# Patient Record
Sex: Male | Born: 1935 | Race: White | Hispanic: No | Smoking: Never smoker
Health system: Southern US, Community
[De-identification: ages and names within clinical notes are randomized; demographics above are authoritative.]

## PROBLEM LIST (undated history)

## (undated) DIAGNOSIS — I499 Cardiac arrhythmia, unspecified: Secondary | ICD-10-CM

## (undated) DIAGNOSIS — N183 Chronic kidney disease, stage 3 unspecified: Secondary | ICD-10-CM

## (undated) DIAGNOSIS — S52509A Unspecified fracture of the lower end of unspecified radius, initial encounter for closed fracture: Secondary | ICD-10-CM

## (undated) DIAGNOSIS — M5416 Radiculopathy, lumbar region: Secondary | ICD-10-CM

## (undated) DIAGNOSIS — E785 Hyperlipidemia, unspecified: Secondary | ICD-10-CM

## (undated) DIAGNOSIS — R040 Epistaxis: Secondary | ICD-10-CM

## (undated) DIAGNOSIS — K219 Gastro-esophageal reflux disease without esophagitis: Secondary | ICD-10-CM

## (undated) DIAGNOSIS — N400 Enlarged prostate without lower urinary tract symptoms: Secondary | ICD-10-CM

## (undated) DIAGNOSIS — H539 Unspecified visual disturbance: Secondary | ICD-10-CM

## (undated) DIAGNOSIS — K4090 Unilateral inguinal hernia, without obstruction or gangrene, not specified as recurrent: Secondary | ICD-10-CM

## (undated) DIAGNOSIS — I2510 Atherosclerotic heart disease of native coronary artery without angina pectoris: Secondary | ICD-10-CM

## (undated) DIAGNOSIS — M8580 Other specified disorders of bone density and structure, unspecified site: Secondary | ICD-10-CM

## (undated) DIAGNOSIS — R42 Dizziness and giddiness: Secondary | ICD-10-CM

## (undated) DIAGNOSIS — H269 Unspecified cataract: Secondary | ICD-10-CM

## (undated) DIAGNOSIS — I10 Essential (primary) hypertension: Secondary | ICD-10-CM

## (undated) DIAGNOSIS — R7301 Impaired fasting glucose: Secondary | ICD-10-CM

## (undated) DIAGNOSIS — I209 Angina pectoris, unspecified: Secondary | ICD-10-CM

## (undated) DIAGNOSIS — F329 Major depressive disorder, single episode, unspecified: Secondary | ICD-10-CM

## (undated) DIAGNOSIS — M109 Gout, unspecified: Secondary | ICD-10-CM

## (undated) DIAGNOSIS — F419 Anxiety disorder, unspecified: Secondary | ICD-10-CM

## (undated) DIAGNOSIS — M1990 Unspecified osteoarthritis, unspecified site: Secondary | ICD-10-CM

## (undated) DIAGNOSIS — C44309 Unspecified malignant neoplasm of skin of other parts of face: Secondary | ICD-10-CM

## (undated) DIAGNOSIS — Z8719 Personal history of other diseases of the digestive system: Secondary | ICD-10-CM

## (undated) HISTORY — DX: Gastro-esophageal reflux disease without esophagitis: K21.9

## (undated) HISTORY — DX: Anxiety disorder, unspecified: F41.9

## (undated) HISTORY — DX: Impaired fasting glucose: R73.01

## (undated) HISTORY — DX: Unspecified osteoarthritis, unspecified site: M19.90

## (undated) HISTORY — PX: EYE SURGERY: SHX253

## (undated) HISTORY — DX: Atherosclerotic heart disease of native coronary artery without angina pectoris: I25.10

## (undated) HISTORY — DX: Chronic kidney disease, stage 3 (moderate): N18.3

## (undated) HISTORY — PX: TONSILLECTOMY: SUR1361

## (undated) HISTORY — DX: Benign prostatic hyperplasia without lower urinary tract symptoms: N40.0

## (undated) HISTORY — DX: Unspecified cataract: H26.9

## (undated) HISTORY — DX: Dizziness and giddiness: R42

## (undated) HISTORY — DX: Other specified disorders of bone density and structure, unspecified site: M85.80

## (undated) HISTORY — DX: Chronic kidney disease, stage 3 unspecified: N18.30

## (undated) HISTORY — DX: Unspecified fracture of the lower end of unspecified radius, initial encounter for closed fracture: S52.509A

## (undated) HISTORY — DX: Gout, unspecified: M10.9

## (undated) HISTORY — DX: Unspecified malignant neoplasm of skin of other parts of face: C44.309

## (undated) HISTORY — DX: Major depressive disorder, single episode, unspecified: F32.9

## (undated) HISTORY — DX: Epistaxis: R04.0

## (undated) HISTORY — DX: Unilateral inguinal hernia, without obstruction or gangrene, not specified as recurrent: K40.90

## (undated) HISTORY — DX: Unspecified visual disturbance: H53.9

## (undated) HISTORY — DX: Radiculopathy, lumbar region: M54.16

---

## 1997-11-30 DIAGNOSIS — R42 Dizziness and giddiness: Secondary | ICD-10-CM

## 1997-11-30 HISTORY — DX: Dizziness and giddiness: R42

## 1998-05-23 ENCOUNTER — Ambulatory Visit (HOSPITAL_COMMUNITY): Admission: RE | Admit: 1998-05-23 | Discharge: 1998-05-23 | Payer: Self-pay | Admitting: Internal Medicine

## 2000-11-30 DIAGNOSIS — M8580 Other specified disorders of bone density and structure, unspecified site: Secondary | ICD-10-CM

## 2000-11-30 HISTORY — DX: Other specified disorders of bone density and structure, unspecified site: M85.80

## 2001-07-12 ENCOUNTER — Encounter: Payer: Self-pay | Admitting: Internal Medicine

## 2001-07-12 ENCOUNTER — Encounter: Admission: RE | Admit: 2001-07-12 | Discharge: 2001-07-12 | Payer: Self-pay | Admitting: Internal Medicine

## 2002-04-16 ENCOUNTER — Encounter: Payer: Self-pay | Admitting: Emergency Medicine

## 2002-04-17 ENCOUNTER — Inpatient Hospital Stay (HOSPITAL_COMMUNITY): Admission: EM | Admit: 2002-04-17 | Discharge: 2002-04-20 | Payer: Self-pay

## 2002-04-17 ENCOUNTER — Encounter: Payer: Self-pay | Admitting: Internal Medicine

## 2005-07-13 ENCOUNTER — Encounter: Admission: RE | Admit: 2005-07-13 | Discharge: 2005-07-13 | Payer: Self-pay | Admitting: Internal Medicine

## 2006-06-22 ENCOUNTER — Encounter: Admission: RE | Admit: 2006-06-22 | Discharge: 2006-06-22 | Payer: Self-pay | Admitting: Internal Medicine

## 2007-12-01 DIAGNOSIS — R040 Epistaxis: Secondary | ICD-10-CM

## 2007-12-01 HISTORY — DX: Epistaxis: R04.0

## 2008-08-19 ENCOUNTER — Encounter: Admission: RE | Admit: 2008-08-19 | Discharge: 2008-08-19 | Payer: Self-pay | Admitting: Internal Medicine

## 2009-07-26 ENCOUNTER — Emergency Department (HOSPITAL_COMMUNITY): Admission: EM | Admit: 2009-07-26 | Discharge: 2009-07-26 | Payer: Self-pay | Admitting: Emergency Medicine

## 2009-12-11 ENCOUNTER — Encounter: Admission: RE | Admit: 2009-12-11 | Discharge: 2009-12-11 | Payer: Self-pay | Admitting: Internal Medicine

## 2011-04-14 NOTE — Consult Note (Signed)
NAME:  Larry Mcfarland, Larry Mcfarland            ACCOUNT NO.:  [id number]   MEDICAL RECORD NO.:  [id number]          PATIENT TYPE:  EMS   LOCATION:  ED                           FACILITY:  [facility]   PHYSICIAN:  Gae Bon, M.D.  DATE OF BIRTH:  Jul 24, 1921   DATE OF CONSULTATION:  07/26/2009  DATE OF DISCHARGE:                                 CONSULTATION   This is an 77 year old white man who was out walking earlier today and  tripped over the curb and fell onto his face and wrist.  He denies loss  of consciousness.  He was taken to Dr. Kristie Cowman at [facility] who evaluated him and ruled out any bony fractures with chest x-ray  and exam, but there was a laceration of the left upper lip at the  vermilion border that Dr. Ronnald Ramp requested oral maxillofacial surgery  care for suturing for this area.  The patient has no known drug  allergies.   PAST MEDICAL HISTORY:  1. Hypertension.  2. Hyperlipidemia.   MEDICATIONS:  Lisinopril, simvastatin.   PHYSICAL EXAMINATION:  HEENT:  [place]/AT.  No fractures palpated facially, a  2.5 cm laceration left upper lip at the vermilion border horizontal and  parallel to the vermilion border.  Tooth #8 was chipped at the medial  incisal edge.  There was a small contusion intraoral aspect at tooth #9  but no laceration.   IMPRESSION:  Upper lip laceration.   PLAN:  To suture.   PROCEDURE:  Lidocaine 2% and 1:100,000 epinephrine was used, a total of  5 mL.  The patient was prepped and draped in the emergency room.  Deep  sutures were placed with 4-0 chromic and then 5-0 nylon interrupted  sutures were placed to approximate the vermilion border.  The patient  tolerated the procedure well.  He was instructed to keep ice on the  area, to keep the sutures dry and to return to my office in 5 days for  suture removal.      Gae Bon, M.D.  Electronically Signed     SMJ/MEDQ  D:  07/26/2009  T:  07/27/2009  Job:  [id number]

## 2011-04-17 NOTE — H&P (Signed)
[place]. [facility]  Patient:    Larry Mcfarland, Larry Mcfarland Visit Number: [id number] MRN: [id number]          Service Type: MED Location: [phone] Attending Physician:  Horton Finer Dictated by:   Nehemiah Settle, M.D. Admit Date:  04/16/2002                           History and Physical  HISTORY OF PRESENT ILLNESS:  Mr. Larry Mcfarland is a very nice 77 year old man admitted with atrial flutter.  He was fine until approximately Apr 12, 2002 when he felt poorly.  On that evening he slept poorly and was tired and just did not feel right.  He was mildly short of breath.  On May 16 he tried to walk late in the evening but could not walk more than 10 minutes due to fatigue and shortness of breath.  On May 17 he felt about the same except for some high chest pressure.  On May 18 he felt a little better at breakfast, but through the day he developed some chest tightness.  Blood pressure was 130/80 at the drugstore and his pulse was around 90 at that time.  This evening he had a temperature of 100.2 to 100.4.  He has had some continued chest pressure and he has just felt sick.  He called me and I directed him to the emergency department.  The emergency department physician found atrial fibrillation with rapid ventricular response.  He was given Cardizem IV and went into atrial flutter.  He had a mild cough for the last four days.  PAST MEDICAL HISTORY:  MEDICAL:  BPH with urinary frequency.  Recent eye exam treated with minocycline.  SURGICAL:  None except tonsillectomy.  ALLERGIES:  PENICILLIN.  MEDICATIONS: 1. Aspirin q.d. and p.r.n. 2. Flomax 0.4 mg daily. 3. Minocycline 50 mg q.d. x15 days for eye infection.  SOCIAL HISTORY:  Cigarettes:  None.  Alcohol:  Occasional.  He still works part-time as an Optometrist.  He is married.  FAMILY HISTORY:  Father died of MI in his 35s.  Mother died in her 73s of heart failure.  Brother has had some type of  valvular heart disease that has not been very serious.  REVIEW OF SYSTEMS:  He fell two days ago when his feet went out from under him.  He hit his head on the table, has had no continuing symptoms.  All other symptoms are negative.  PHYSICAL EXAMINATION:  GENERAL:  Well-developed, well-nourished, in no distress.  VITAL SIGNS:  Blood pressure 138/88, pulse 158, respiratory rate 20, O2 saturation 97% on room air.  HEENT:  The eyes have pupils which are round and react to light.  The extraocular movements are intact.  Nose and throat are unremarkable.  NECK:  Supple.  Thyroid is not enlarged or tender.  CHEST:  Clear to auscultation and percussion.  CARDIAC:  Has a rapid, regular rate with a normal S1 and S2 without an S3, S4, murmur, rub, or click.  ABDOMEN:  Soft and nontender with normal bowel sounds without hepatosplenomegaly or mass.  EXTREMITIES:  Without cyanosis, clubbing, or edema.  RECTAL:  Found brown, heme negative stool.  LABORATORY DATA:  EKG shows atrial flutter with 2:1 block, no ischemic changes.  Chest x-ray is clear.  LABORATORY DATA:  Included troponin 0.12, CK negative.  CMET negative.  CBC negative.  IMPRESSION: 1. Atrial flutter with 2:1 block.  There is no evidence of cardiac    decompensation at present.  The etiology is not clear.  Need to exclude    thyroid disease, pulmonary embolism, coronary artery disease, silent    valvular disease, myocarditis, etc.  The patient will be admitted, placed    on heparin, Cardizem to control his rate.  Chest CT will be obtained,    thyroid function tests will be obtained, echocardiogram will be obtained,    and a cardiology consultation will be asked. 2. Elevated troponin.  See above. 3. Benign prostatic hypertrophy.  Stable. Dictated by:   Nehemiah Settle, M.D. Attending Physician:  Horton Finer DD:  04/17/02 TD:  04/17/02 Job: [id number]

## 2011-04-17 NOTE — Consult Note (Signed)
[place]. [facility]  Patient:    Larry Mcfarland, SLAUSON Visit Number: [id number] MRN: [id number]          Service Type: MED Location: [phone] Attending Physician:  Horton Finer Dictated by:   Illene Labrador, M.D. Proc. Date: 04/17/02 Admit Date:  04/16/2002   CC:         Nehemiah Settle, M.D.  Room 4707   Consultation Report  REASON FOR CONSULTATION:  Atrial flutter.  CONCLUSIONS: 1. Atrial flutter with spontaneous reversion on Apr 17, 2002.   Onset    probably Apr 13, 2002. 2. Continuous chest tightness associated with palpitation.  (#1 above) 3. Trace-positive troponin I with normal CK-MB of uncertain significant,    probably related to prolonged tachycardia and not significant ischemic    heart disease.  RECOMMENDATIONS: 1. Lopressor 25 mg p.o. b.i.d. (increased troponin may represent obstructive    coronary disease) to suppress recurrent atrial flutter. 2. Discontinue Cardizem. 3. A 2-D echocardiogram. 4. Aspirin 81 mg q.d. 5. Outpatient Cardiolite study if no obvious regional wall motion    abnormalities on echocardiogram. 6. Discontinue heparin within the next 24 hours if echocardiogram is    virtually normal.  If significant structural abnormalities are noted,    Coumadin therapy may be necessary.  COMMENTS:  The patient is 33 and has been in excellent health.  There is no prior history of cardiopulmonary problems.  On Apr 13, 2002, he began noticing exertional fatigue, dyspnea, and chest tightness.  This persisted over the weekend, and he called Dr. Maxwell Caul last evening, came to the ER, and was documented to be in atrial flutter with 2:1 conduction at a ventricular rate of 154 beats per minute.  He was started on IV Cardizem, and this morning around 9:40, he had spontaneous reversion.  The post conversion EKG revealed symmetrical anterior T wave inversions consistent with lateral ischemia.  He currently feels well.  He  is having no chest discomfort, denies shortness of breath.  ALLERGIES:  PENICILLIN.  SIGNIFICANT MEDICAL PROBLEMS: 1. Benign prostatic hypertrophy. 2. Status post tonsillectomy and adenoidectomy.  MEDICATIONS: 1. Flomax 0.4 mg per day. 2. Aspirin once a day.  HABITS:  He does not smoke.  He drinks alcohol rarely.  FAMILY HISTORY:  His father died of myocardial infarction in his 28s.  His mother died of CHF in her 56s related to valvular heart disease.  PHYSICAL EXAMINATION:  VITAL SIGNS:  Blood pressure 118/70, heart rate 80.  HEART:  No murmur, no click.  No JVD.  CHEST:  The lungs are clear to auscultation and percussion.  EXTREMITIES:  No edema.  DIAGNOSTIC DATA:  EKG plus conversion reveals normal sinus rhythm at rate of 82, anterior symmetrical T wave inversion V4 through V6.  Troponin I 0.12.  DISCUSSION:  The patient has had atrial flutter for three or perhaps four day. The trace positive for troponins are concerning, especially given the post conversion EKG that demonstrated anterior T wave inversions.  The echocardiogram will help [place] rule out segmental wall motion abnormalities or other structure heart disease.  He would seem at the very least need to have a Cardiolite myocardial perfusion study performed perhaps as an outpatient.  We will start low-dose beta blocker to help suppress recurrent atrial flutter and consider Coumadin therapy if significant structural abnormalities are noted. Dictated by:   Illene Labrador, M.D. Attending Physician:  Horton Finer DD:  04/17/02 TD:  04/17/02 Job: [id number]  [id number]

## 2011-04-17 NOTE — Cardiovascular Report (Signed)
[place]. [facility]  Patient:    Larry, Mcfarland Visit Number: [id number] MRN: [id number]          Service Type: MED Location: [phone] Attending Physician:  Horton Finer Dictated by:   Illene Labrador, M.D. Proc. Date: 04/19/02 Admit Date:  04/16/2002 Discharge Date: 04/20/2002   CC:         Nehemiah Settle, M.D.   Cardiac Catheterization  INDICATIONS FOR CORONARY ANGIOGRAPHY:  Recent episode of atrial flutter, prolonged over several days and associated with chest tightness, trace positive Troponin I determinations, and regional wall motion abnormality noted on echocardiography.  PROCEDURES PERFORMED: 1. Left heart catheterization. 2. Selective coronary angiography. 3. Left ventriculography.  DESCRIPTION OF PROCEDURE:  After an informed consent, a 6-French sheath was placed in the right femoral artery using the modified Seldinger technique.  A tremendous amount of iliofemoral tortuosity was noted.  After placing the wire into the descending aorta, we did not traverse this again with the wire and simply did wire exchanges over the catheters.  We were unable to torque the multipurpose catheter, and therefore only able to perform hemodynamic recordings and left ventriculography.  We then used a 6-French left and right #4 Judkins catheter for coronary coronary angiography respectively.  The patient tolerated the procedure without complications.  RESULTS:  HEMODYNAMIC DATA: 1. Aortic pressure:  142/70. 2. Left ventricular pressure:  142/11.  LEFT VENTRICULOGRAPHY:  The left ventricle is normal in size and demonstrates normal overall contractility.  Ejection fraction is 65%.  CORONARY ANGIOGRAPHY: 1. LEFT MAIN CORONARY:  Long, and free of any significant obstruction.  There    is perhaps 20% plaquing. 2. LEFT ANTERIOR DESCENDING CORONARY:  Relatively small.  It stops short of a    the left ventricular apex.  There are multiple  luminal irregularities noted    in the mid, proximal and distal segment.  Two small diagonal branches arise    from the LAD.  No high grade or significant obstruction is felt to be    present in the LAD. 3. CIRCUMFLEX ARTERY:  Large.  It gives origin to a dominate obtuse marginal    and supplies a left ventricular apex.  This obtuse marginal arises    proximally off the circumflex, and contains a 50-60% ostial narrowing.  Two    small obtuse marginal branches arise more distally from the circumflex and    are free of any significant obstruction. 4. RIGHT CORONARY:  Dominant.  It gives origin to a large left ventricular    branch, and also a large PDA that reaches and wraps around the left    ventricular apex.  There is proximal and mid irregularity noted in the    right coronary; with up to 40% narrowing in multiple spots.  The PDA also    contains irregularities.  CONCLUSIONS: 1. Luminal irregularities (plaquing) noted in the proximal, mid and distal    LAD; the mid and proximal RCA; and in the first obtuse marginal in    circumflex.  The lesion in the first obtuse marginal of the circumflex in    the 60-70% range.  No high-grade significant obstructive lesions are noted. 2. Normal left ventricular function.  RECOMMENDATIONS:  Consider risk factor modification with management of lipid disorder.  Aspirin one per day, exercise, and therapeutic lifestyle changes, including increased dietary vegetables and fruits, and decreased saturated fat intake. Dictated by:   Illene Labrador, M.D. Attending Physician:  Horton Finer DD:  04/19/02 TD:  04/22/02 Job: [id number]

## 2011-04-17 NOTE — Discharge Summary (Signed)
[place]. [facility]  Patient:    Larry Mcfarland, Larry Mcfarland Visit Number: [id number] MRN: [id number]          Service Type: MED Location: [phone] Attending Physician:  Horton Finer Dictated by:   Nehemiah Settle, M.D. Admit Date:  04/16/2002 Discharge Date: 04/20/2002                             Discharge Summary  ADMITTING DIAGNOSIS:  Atrial flutter with uncontrolled rate.  DISCHARGE DIAGNOSES: 1. Atrial flutter, resolved.  Negative thyroid function test and negative    computed tomography scan for pulmonary embolism. 2. Coronary artery disease with segmental wall motion abnormality on    echocardiogram, elevated troponin with normal creatinine kinase and 65-75%    obtuse marginal-1 and 30% ramus intermedius lesions. 3. Benign prostatic hypertrophy.  HISTORY OF PRESENT ILLNESS:  Please see the admission History and Physical examination for details.  HOSPITAL COURSE:  The patient was admitted and placed on Cardizem in the ER. He initially had rates of around 150.  We were able to block him down to 80 with Cardizem.  Overnight, he felt much better, but continued to have a vague sense of chest fullness.  Troponins were positive in the range of 0.17, but CKs were negative.  Cardiology consultation was requested after thyroid test and echocardiogram were ordered.  The patient converted just prior to the cardiology consultation and Dr. Tamala Julian recommended discontinuation of Cardizem and treatment with Lopressor.  This was begun.  The echocardiogram revealed a segmental wall motion abnormality.  Therefore, the patient underwent coronary arteriography which revealed a small left anterior descending coronary artery, a 20% left main coronary artery lesion, 65-70% OM-1 stenosis and a 30% ramus intermedius stenosis.  LV function was normal.  Risk factor modification and medical therapy was recommended.  He had no problems tolerating the procedure well.  He  did well and remained in normal sinus rhythm.  He was discharged in improved condition.  DISCHARGE MEDICATIONS: 1. Toprol XL 50 mg once daily. 2. Aspirin once daily.  ACTIVITY:  No restrictions.  DIET:  No added salt, low fat diet.  SPECIAL INSTRUCTIONS:  He is to call if he gets any groin pain or swelling. We will call him to make an appointment to follow up on his cholesterol and then an office visit. Dictated by:   Nehemiah Settle, M.D. Attending Physician:  Horton Finer DD:  04/20/02 TD:  04/22/02 Job: [id number]

## 2011-11-04 ENCOUNTER — Emergency Department (HOSPITAL_COMMUNITY)
Admission: EM | Admit: 2011-11-04 | Discharge: 2011-11-04 | Disposition: A | Discharge status: Home / Self Care | Payer: No Typology Code available for payment source | Attending: Emergency Medicine | Admitting: Emergency Medicine

## 2011-11-04 ENCOUNTER — Emergency Department (HOSPITAL_COMMUNITY): Payer: No Typology Code available for payment source

## 2011-11-04 ENCOUNTER — Encounter: Payer: Self-pay | Admitting: Emergency Medicine

## 2011-11-04 DIAGNOSIS — R079 Chest pain, unspecified: Secondary | ICD-10-CM | POA: Insufficient documentation

## 2011-11-04 DIAGNOSIS — S2239XA Fracture of one rib, unspecified side, initial encounter for closed fracture: Secondary | ICD-10-CM

## 2011-11-04 HISTORY — DX: Essential (primary) hypertension: I10

## 2011-11-04 HISTORY — DX: Hyperlipidemia, unspecified: E78.5

## 2011-11-04 MED ORDER — OXYCODONE-ACETAMINOPHEN 5-325 MG PO TABS: 1.0000 | ORAL_TABLET | ORAL | Status: AC | PRN
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
## 2011-11-04 NOTE — ED Notes (Signed)
Pt. Alert and oriented, NAD noted, respirations even and regular, pt. Discharged to home with son
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
## 2011-11-04 NOTE — ED Notes (Signed)
Pt. Returned from CT scan, Pt. Alert and oriented, NAD noted, voided via urinal
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
## 2011-11-05 NOTE — ED Provider Notes (Signed)
I saw and evaluated the patient, reviewed the resident's note and I agree with the findings and plan.   Delora Fuel, MD [id number] [id number]

## 2011-12-01 DIAGNOSIS — S52509A Unspecified fracture of the lower end of unspecified radius, initial encounter for closed fracture: Secondary | ICD-10-CM

## 2011-12-01 HISTORY — DX: Unspecified fracture of the lower end of unspecified radius, initial encounter for closed fracture: S52.509A

## 2012-01-04 DIAGNOSIS — I10 Essential (primary) hypertension: Secondary | ICD-10-CM | POA: Diagnosis not present

## 2012-01-04 DIAGNOSIS — R7301 Impaired fasting glucose: Secondary | ICD-10-CM | POA: Diagnosis not present

## 2012-01-04 DIAGNOSIS — N183 Chronic kidney disease, stage 3 unspecified: Secondary | ICD-10-CM | POA: Diagnosis not present

## 2012-01-11 DIAGNOSIS — E7800 Pure hypercholesterolemia, unspecified: Secondary | ICD-10-CM | POA: Diagnosis not present

## 2012-01-11 DIAGNOSIS — R7301 Impaired fasting glucose: Secondary | ICD-10-CM | POA: Diagnosis not present

## 2012-01-11 DIAGNOSIS — N183 Chronic kidney disease, stage 3 unspecified: Secondary | ICD-10-CM | POA: Diagnosis not present

## 2012-01-11 DIAGNOSIS — Z1331 Encounter for screening for depression: Secondary | ICD-10-CM | POA: Diagnosis not present

## 2012-01-11 DIAGNOSIS — I10 Essential (primary) hypertension: Secondary | ICD-10-CM | POA: Diagnosis not present

## 2012-01-12 DIAGNOSIS — H00019 Hordeolum externum unspecified eye, unspecified eyelid: Secondary | ICD-10-CM | POA: Diagnosis not present

## 2012-01-14 DIAGNOSIS — N401 Enlarged prostate with lower urinary tract symptoms: Secondary | ICD-10-CM | POA: Diagnosis not present

## 2012-01-14 DIAGNOSIS — N138 Other obstructive and reflux uropathy: Secondary | ICD-10-CM | POA: Diagnosis not present

## 2012-04-12 DIAGNOSIS — I10 Essential (primary) hypertension: Secondary | ICD-10-CM | POA: Diagnosis not present

## 2012-04-18 DIAGNOSIS — N183 Chronic kidney disease, stage 3 unspecified: Secondary | ICD-10-CM | POA: Diagnosis not present

## 2012-04-18 DIAGNOSIS — R7301 Impaired fasting glucose: Secondary | ICD-10-CM | POA: Diagnosis not present

## 2012-04-18 DIAGNOSIS — I10 Essential (primary) hypertension: Secondary | ICD-10-CM | POA: Diagnosis not present

## 2012-05-16 DIAGNOSIS — H2510 Age-related nuclear cataract, unspecified eye: Secondary | ICD-10-CM | POA: Diagnosis not present

## 2012-05-16 DIAGNOSIS — G43809 Other migraine, not intractable, without status migrainosus: Secondary | ICD-10-CM | POA: Diagnosis not present

## 2012-05-16 DIAGNOSIS — Z961 Presence of intraocular lens: Secondary | ICD-10-CM | POA: Diagnosis not present

## 2012-05-18 DIAGNOSIS — F329 Major depressive disorder, single episode, unspecified: Secondary | ICD-10-CM | POA: Diagnosis not present

## 2012-05-18 DIAGNOSIS — S91009A Unspecified open wound, unspecified ankle, initial encounter: Secondary | ICD-10-CM | POA: Diagnosis not present

## 2012-05-18 DIAGNOSIS — Z634 Disappearance and death of family member: Secondary | ICD-10-CM | POA: Diagnosis not present

## 2012-05-18 DIAGNOSIS — G4700 Insomnia, unspecified: Secondary | ICD-10-CM | POA: Diagnosis not present

## 2012-05-18 DIAGNOSIS — S51809A Unspecified open wound of unspecified forearm, initial encounter: Secondary | ICD-10-CM | POA: Diagnosis not present

## 2012-05-18 DIAGNOSIS — S81009A Unspecified open wound, unspecified knee, initial encounter: Secondary | ICD-10-CM | POA: Diagnosis not present

## 2012-05-18 DIAGNOSIS — F3289 Other specified depressive episodes: Secondary | ICD-10-CM | POA: Diagnosis not present

## 2012-05-19 DIAGNOSIS — R498 Other voice and resonance disorders: Secondary | ICD-10-CM | POA: Diagnosis not present

## 2012-05-19 DIAGNOSIS — IMO0002 Reserved for concepts with insufficient information to code with codable children: Secondary | ICD-10-CM | POA: Diagnosis not present

## 2012-05-19 DIAGNOSIS — R42 Dizziness and giddiness: Secondary | ICD-10-CM | POA: Diagnosis not present

## 2012-05-19 DIAGNOSIS — F4321 Adjustment disorder with depressed mood: Secondary | ICD-10-CM | POA: Diagnosis not present

## 2012-05-31 DIAGNOSIS — R1310 Dysphagia, unspecified: Secondary | ICD-10-CM | POA: Diagnosis not present

## 2012-05-31 DIAGNOSIS — R490 Dysphonia: Secondary | ICD-10-CM | POA: Diagnosis not present

## 2012-05-31 DIAGNOSIS — K219 Gastro-esophageal reflux disease without esophagitis: Secondary | ICD-10-CM | POA: Diagnosis not present

## 2012-06-01 ENCOUNTER — Other Ambulatory Visit: Payer: Self-pay | Admitting: Otolaryngology

## 2012-06-01 DIAGNOSIS — R1310 Dysphagia, unspecified: Secondary | ICD-10-CM

## 2012-06-03 DIAGNOSIS — R498 Other voice and resonance disorders: Secondary | ICD-10-CM | POA: Diagnosis not present

## 2012-06-03 DIAGNOSIS — M25539 Pain in unspecified wrist: Secondary | ICD-10-CM | POA: Diagnosis not present

## 2012-06-07 DIAGNOSIS — L570 Actinic keratosis: Secondary | ICD-10-CM | POA: Diagnosis not present

## 2012-06-07 DIAGNOSIS — Z85828 Personal history of other malignant neoplasm of skin: Secondary | ICD-10-CM | POA: Diagnosis not present

## 2012-06-08 ENCOUNTER — Ambulatory Visit
Admission: RE | Admit: 2012-06-08 | Discharge: 2012-06-08 | Disposition: A | Discharge status: Home / Self Care | Payer: Medicare Other | Source: Ambulatory Visit | Attending: Otolaryngology | Admitting: Otolaryngology

## 2012-06-08 DIAGNOSIS — K2289 Other specified disease of esophagus: Secondary | ICD-10-CM | POA: Diagnosis not present

## 2012-06-08 DIAGNOSIS — K228 Other specified diseases of esophagus: Secondary | ICD-10-CM | POA: Diagnosis not present

## 2012-06-08 DIAGNOSIS — K219 Gastro-esophageal reflux disease without esophagitis: Secondary | ICD-10-CM | POA: Diagnosis not present

## 2012-06-08 DIAGNOSIS — R1310 Dysphagia, unspecified: Secondary | ICD-10-CM | POA: Diagnosis not present

## 2012-06-09 DIAGNOSIS — G5620 Lesion of ulnar nerve, unspecified upper limb: Secondary | ICD-10-CM | POA: Diagnosis not present

## 2012-06-09 DIAGNOSIS — M1990 Unspecified osteoarthritis, unspecified site: Secondary | ICD-10-CM | POA: Diagnosis not present

## 2012-06-15 DIAGNOSIS — G5620 Lesion of ulnar nerve, unspecified upper limb: Secondary | ICD-10-CM | POA: Diagnosis not present

## 2012-06-15 DIAGNOSIS — M1990 Unspecified osteoarthritis, unspecified site: Secondary | ICD-10-CM | POA: Diagnosis not present

## 2012-06-21 DIAGNOSIS — M1990 Unspecified osteoarthritis, unspecified site: Secondary | ICD-10-CM | POA: Diagnosis not present

## 2012-06-21 DIAGNOSIS — G5620 Lesion of ulnar nerve, unspecified upper limb: Secondary | ICD-10-CM | POA: Diagnosis not present

## 2012-06-28 DIAGNOSIS — G5620 Lesion of ulnar nerve, unspecified upper limb: Secondary | ICD-10-CM | POA: Diagnosis not present

## 2012-06-28 DIAGNOSIS — M1990 Unspecified osteoarthritis, unspecified site: Secondary | ICD-10-CM | POA: Diagnosis not present

## 2012-07-04 DIAGNOSIS — M1990 Unspecified osteoarthritis, unspecified site: Secondary | ICD-10-CM | POA: Diagnosis not present

## 2012-07-04 DIAGNOSIS — G5620 Lesion of ulnar nerve, unspecified upper limb: Secondary | ICD-10-CM | POA: Diagnosis not present

## 2012-07-05 DIAGNOSIS — I10 Essential (primary) hypertension: Secondary | ICD-10-CM | POA: Diagnosis not present

## 2012-07-12 DIAGNOSIS — N183 Chronic kidney disease, stage 3 unspecified: Secondary | ICD-10-CM | POA: Diagnosis not present

## 2012-07-12 DIAGNOSIS — I10 Essential (primary) hypertension: Secondary | ICD-10-CM | POA: Diagnosis not present

## 2012-07-12 DIAGNOSIS — R7301 Impaired fasting glucose: Secondary | ICD-10-CM | POA: Diagnosis not present

## 2012-07-14 ENCOUNTER — Other Ambulatory Visit: Payer: Self-pay | Admitting: Orthopedic Surgery

## 2012-07-14 DIAGNOSIS — M25531 Pain in right wrist: Secondary | ICD-10-CM

## 2012-07-14 DIAGNOSIS — G5620 Lesion of ulnar nerve, unspecified upper limb: Secondary | ICD-10-CM | POA: Diagnosis not present

## 2012-07-14 DIAGNOSIS — M1990 Unspecified osteoarthritis, unspecified site: Secondary | ICD-10-CM | POA: Diagnosis not present

## 2012-07-20 ENCOUNTER — Ambulatory Visit
Admission: RE | Admit: 2012-07-20 | Discharge: 2012-07-20 | Disposition: A | Discharge status: Home / Self Care | Payer: Medicare Other | Source: Ambulatory Visit | Attending: Orthopedic Surgery | Admitting: Orthopedic Surgery

## 2012-07-20 DIAGNOSIS — M25531 Pain in right wrist: Secondary | ICD-10-CM

## 2012-07-20 DIAGNOSIS — M25539 Pain in unspecified wrist: Secondary | ICD-10-CM | POA: Diagnosis not present

## 2012-07-20 MED ORDER — IOHEXOL 180 MG/ML  SOLN
4.0000 mL | Freq: Once | INTRAMUSCULAR | Status: AC | PRN
  Administered 2012-07-20: 4 mL via INTRA_ARTICULAR

## 2012-07-28 DIAGNOSIS — S52599A Other fractures of lower end of unspecified radius, initial encounter for closed fracture: Secondary | ICD-10-CM | POA: Diagnosis not present

## 2012-07-28 DIAGNOSIS — G5620 Lesion of ulnar nerve, unspecified upper limb: Secondary | ICD-10-CM | POA: Diagnosis not present

## 2012-07-28 DIAGNOSIS — M1990 Unspecified osteoarthritis, unspecified site: Secondary | ICD-10-CM | POA: Diagnosis not present

## 2012-08-16 DIAGNOSIS — R490 Dysphonia: Secondary | ICD-10-CM | POA: Diagnosis not present

## 2012-08-16 DIAGNOSIS — K219 Gastro-esophageal reflux disease without esophagitis: Secondary | ICD-10-CM | POA: Diagnosis not present

## 2012-08-22 DIAGNOSIS — S52599A Other fractures of lower end of unspecified radius, initial encounter for closed fracture: Secondary | ICD-10-CM | POA: Diagnosis not present

## 2012-08-22 DIAGNOSIS — G5620 Lesion of ulnar nerve, unspecified upper limb: Secondary | ICD-10-CM | POA: Diagnosis not present

## 2012-08-29 DIAGNOSIS — M1990 Unspecified osteoarthritis, unspecified site: Secondary | ICD-10-CM | POA: Diagnosis not present

## 2012-08-29 DIAGNOSIS — S52599A Other fractures of lower end of unspecified radius, initial encounter for closed fracture: Secondary | ICD-10-CM | POA: Diagnosis not present

## 2012-08-29 DIAGNOSIS — G5620 Lesion of ulnar nerve, unspecified upper limb: Secondary | ICD-10-CM | POA: Diagnosis not present

## 2012-10-06 DIAGNOSIS — I10 Essential (primary) hypertension: Secondary | ICD-10-CM | POA: Diagnosis not present

## 2012-10-06 DIAGNOSIS — S52599A Other fractures of lower end of unspecified radius, initial encounter for closed fracture: Secondary | ICD-10-CM | POA: Diagnosis not present

## 2012-10-13 DIAGNOSIS — I10 Essential (primary) hypertension: Secondary | ICD-10-CM | POA: Diagnosis not present

## 2012-10-13 DIAGNOSIS — R7301 Impaired fasting glucose: Secondary | ICD-10-CM | POA: Diagnosis not present

## 2012-10-13 DIAGNOSIS — N183 Chronic kidney disease, stage 3 unspecified: Secondary | ICD-10-CM | POA: Diagnosis not present

## 2012-10-13 DIAGNOSIS — F329 Major depressive disorder, single episode, unspecified: Secondary | ICD-10-CM | POA: Diagnosis not present

## 2012-11-17 DIAGNOSIS — S52599A Other fractures of lower end of unspecified radius, initial encounter for closed fracture: Secondary | ICD-10-CM | POA: Diagnosis not present

## 2012-12-26 DIAGNOSIS — K4090 Unilateral inguinal hernia, without obstruction or gangrene, not specified as recurrent: Secondary | ICD-10-CM | POA: Diagnosis not present

## 2013-01-02 DIAGNOSIS — N183 Chronic kidney disease, stage 3 unspecified: Secondary | ICD-10-CM | POA: Diagnosis not present

## 2013-01-02 DIAGNOSIS — R7301 Impaired fasting glucose: Secondary | ICD-10-CM | POA: Diagnosis not present

## 2013-01-02 DIAGNOSIS — F329 Major depressive disorder, single episode, unspecified: Secondary | ICD-10-CM | POA: Diagnosis not present

## 2013-01-02 DIAGNOSIS — I10 Essential (primary) hypertension: Secondary | ICD-10-CM | POA: Diagnosis not present

## 2013-01-03 ENCOUNTER — Encounter (INDEPENDENT_AMBULATORY_CARE_PROVIDER_SITE_OTHER): Payer: Self-pay | Admitting: General Surgery

## 2013-01-10 ENCOUNTER — Encounter (INDEPENDENT_AMBULATORY_CARE_PROVIDER_SITE_OTHER): Payer: Self-pay | Admitting: General Surgery

## 2013-01-10 ENCOUNTER — Ambulatory Visit (INDEPENDENT_AMBULATORY_CARE_PROVIDER_SITE_OTHER): Payer: Medicare Other | Admitting: General Surgery

## 2013-01-10 VITALS — BP 132/74 | HR 60 | Temp 98.0°F | Resp 18 | Ht 70.0 in | Wt 145.0 lb

## 2013-01-10 DIAGNOSIS — K4090 Unilateral inguinal hernia, without obstruction or gangrene, not specified as recurrent: Secondary | ICD-10-CM | POA: Insufficient documentation

## 2013-01-10 NOTE — Patient Instructions (Signed)
You have a right inguinal hernia.  There is no immediate danger, however it would be appropriate to repair this surgically at some point in the future at your convenience to prevent progression or problems down the line.  Read over the information booklets that I gave you. Thank about this and discuss it with your family.  Call me back if you have further questions or if you decide to schedule the surgery.

## 2013-01-10 NOTE — Progress Notes (Signed)
Patient ID: Larry Mcfarland, male   DOB: 1921-10-26, 79 y.o.   MRN: [id number]  Chief Complaint  Patient presents with  . New Evaluation    Ing Hernia    HPI Larry Mcfarland is a 79 y.o. male.  He is referred by Dr. Nehemiah Settle for evaluation of a right inguinal hernia.  Despite his advanced age he is doing very well mentally and physically. He is a widower and lives alone. He drives his car. He travels. He does not have profound medical problems and seems to be stable.  He has never had a hernia before, but recently was in the shower and noticed a bulge in his right groin. This reduces when he lies down. There has not been any episode of pain or incarceration. He was referred for a surgical opinion.  HPI  Past Medical History  Diagnosis Date  . Hypertension   . Hyperlipemia   . Inguinal hernia without mention of obstruction or gangrene, unilateral or unspecified, (not specified as recurrent)   . CKD (chronic kidney disease), stage III   . Anxiety   . Osteopenia 2002  . BPH (benign prostatic hyperplasia)   . Osteoarthritis   . Gout   . CAD (coronary artery disease)   . GERD (gastroesophageal reflux disease)   . Vision disturbance   . Migraine equivalent   . Lumbar radiculopathy   . Vertigo 1999    negative ct scan head; rare recurrance, last in 2013  . Bleeding from the nose 2009    cautery by Dr. Erik Obey  . Cataract     right eye  . Impaired fasting glucose   . Skin cancer of forehead     left (BCE)  . Distal radius fracture 2013    Dr. Fredna Dow  . Major depressive disorder, single episode, unspecified     Past Surgical History  Procedure Laterality Date  . Tonsillectomy      at age 62 yrs old    Family History  Problem Relation Age of Onset  . Heart disease Mother   . Heart disease Father   . Cancer Sister     breast     Social History History  Substance Use Topics  . Smoking status: Never Smoker   . Smokeless tobacco: Never Used  . Alcohol Use:  Yes     Comment: occasional    Allergies  Allergen Reactions  . Penicillins Nausea Only    Upset stomach    Current Outpatient Prescriptions  Medication Sig Dispense Refill  . ALPRAZolam (XANAX) 0.25 MG tablet Take 0.25 mg by mouth at bedtime as needed. 1/4 to 1/2 qhs prn qd      . ASPIRIN PO Take 81 mg by mouth daily.       . cholecalciferol (VITAMIN D) 1000 UNITS tablet Take 1,000 Units by mouth daily.      . fluticasone (FLONASE) 50 MCG/ACT nasal spray Place 2 sprays into the nose daily.      . Glucosamine 500 MG CAPS Take by mouth daily.      Marland Kitchen lisinopril (PRINIVIL,ZESTRIL) 20 MG tablet Take 20 mg by mouth 2 (two) times daily. 1/2 tablet per dosage.      Marland Kitchen METOPROLOL TARTRATE PO Take 12.5 mg by mouth 2 (two) times daily.       . Multiple Vitamins-Minerals (MULTIVITAMIN PO) Take by mouth daily.      Marland Kitchen omeprazole (PRILOSEC) 20 MG capsule Take 20 mg by mouth daily.      Marland Kitchen  SIMVASTATIN PO Take by mouth.       . TAMSULOSIN HCL PO Take by mouth.         No current facility-administered medications for this visit.    Review of Systems Review of Systems  Constitutional: Negative for fever, chills and unexpected weight change.  HENT: Negative for hearing loss, congestion, sore throat, trouble swallowing and voice change.   Eyes: Negative for visual disturbance.  Respiratory: Negative for cough and wheezing.   Cardiovascular: Negative for chest pain, palpitations and leg swelling.  Gastrointestinal: Negative for nausea, vomiting, abdominal pain, diarrhea, constipation, blood in stool, abdominal distention, anal bleeding and rectal pain.  Genitourinary: Negative for hematuria and difficulty urinating.  Musculoskeletal: Negative for arthralgias.  Skin: Negative for rash and wound.  Neurological: Negative for seizures, syncope, weakness and headaches.  Hematological: Negative for adenopathy. Does not bruise/bleed easily.  Psychiatric/Behavioral: Negative for confusion.    Blood  pressure 132/74, pulse 60, temperature 98 F (36.7 C), resp. rate 18, height 5\' 10"  (1.778 m), weight 145 lb (65.772 kg).  Physical Exam Physical Exam  Constitutional: He is oriented to person, place, and time. He appears well-developed and well-nourished. No distress.  HENT:  Head: Normocephalic.  Nose: Nose normal.  Mouth/Throat: No oropharyngeal exudate.  Eyes: Conjunctivae and EOM are normal. Pupils are equal, round, and reactive to light. Right eye exhibits no discharge. Left eye exhibits no discharge. No scleral icterus.  Neck: Normal range of motion. Neck supple. No JVD present. No tracheal deviation present. No thyromegaly present.  Cardiovascular: Normal rate, regular rhythm, normal heart sounds and intact distal pulses.   No murmur heard. Pulmonary/Chest: Effort normal and breath sounds normal. No stridor. No respiratory distress. He has no wheezes. He has no rales. He exhibits no tenderness.  Abdominal: Soft. Bowel sounds are normal. He exhibits no distension and no mass. There is no tenderness. There is no rebound and no guarding.  Umbilicus normal. No organomegaly.  Genitourinary:  Small to medium sized right inguinal hernia, easily reducible. No evidence of hernia on left. No scrotal mass.  Musculoskeletal: Normal range of motion. He exhibits no edema and no tenderness.  Lymphadenopathy:    He has no cervical adenopathy.  Neurological: He is alert and oriented to person, place, and time. He has normal reflexes. Coordination normal.  Skin: Skin is warm and dry. No rash noted. He is not diaphoretic. No erythema. No pallor.  Psychiatric: He has a normal mood and affect. His behavior is normal. Judgment and thought content normal.    Data Reviewed Dr. Nancee Liter office notes  Assessment    Reducible right inguinal hernia, minimally symptomatic.  Hypertension  Chronic kidney disease  BPH  Anxiety  Coronary artery disease  Single episode of atrial fibrillation in  the past  GERD     Plan    I had a long discussion with the patient about his right inguinal hernia. I discussed the anatomy, natural history, risks. Also discussed the different techniques for surgery including open repair and laparoscopic repair. I discussed the disabilities of following surgery and their duration. I discussed the risks of surgery. He is aware that this is elective, but recommended to prevent natural progression and complications in the future.  He is going to go home and think about this. He is not sure whether he wants to have surgery at this time or not. I told him that was fine. He will call me back when he makes his decision.  Larry Mcfarland. Dalbert Batman, M.D., [facility] General and Minimally invasive Surgery Breast and Colorectal Surgery Office:   [phone] Pager:   [phone]  01/10/2013, 3:35 PM

## 2013-01-17 DIAGNOSIS — N183 Chronic kidney disease, stage 3 unspecified: Secondary | ICD-10-CM | POA: Diagnosis not present

## 2013-01-17 DIAGNOSIS — K4090 Unilateral inguinal hernia, without obstruction or gangrene, not specified as recurrent: Secondary | ICD-10-CM | POA: Diagnosis not present

## 2013-01-17 DIAGNOSIS — I10 Essential (primary) hypertension: Secondary | ICD-10-CM | POA: Diagnosis not present

## 2013-01-31 DIAGNOSIS — N138 Other obstructive and reflux uropathy: Secondary | ICD-10-CM | POA: Diagnosis not present

## 2013-01-31 DIAGNOSIS — R351 Nocturia: Secondary | ICD-10-CM | POA: Diagnosis not present

## 2013-03-31 ENCOUNTER — Emergency Department (HOSPITAL_COMMUNITY)
Admission: EM | Admit: 2013-03-31 | Discharge: 2013-04-01 | Disposition: A | Discharge status: Home / Self Care | Payer: Medicare Other | Attending: Emergency Medicine | Admitting: Emergency Medicine

## 2013-03-31 ENCOUNTER — Encounter (HOSPITAL_COMMUNITY): Payer: Self-pay | Admitting: Emergency Medicine

## 2013-03-31 DIAGNOSIS — Z8669 Personal history of other diseases of the nervous system and sense organs: Secondary | ICD-10-CM | POA: Diagnosis not present

## 2013-03-31 DIAGNOSIS — I129 Hypertensive chronic kidney disease with stage 1 through stage 4 chronic kidney disease, or unspecified chronic kidney disease: Secondary | ICD-10-CM | POA: Diagnosis not present

## 2013-03-31 DIAGNOSIS — Z8679 Personal history of other diseases of the circulatory system: Secondary | ICD-10-CM | POA: Diagnosis not present

## 2013-03-31 DIAGNOSIS — Z85828 Personal history of other malignant neoplasm of skin: Secondary | ICD-10-CM | POA: Diagnosis not present

## 2013-03-31 DIAGNOSIS — E785 Hyperlipidemia, unspecified: Secondary | ICD-10-CM | POA: Diagnosis not present

## 2013-03-31 DIAGNOSIS — N183 Chronic kidney disease, stage 3 unspecified: Secondary | ICD-10-CM | POA: Diagnosis not present

## 2013-03-31 DIAGNOSIS — Z8739 Personal history of other diseases of the musculoskeletal system and connective tissue: Secondary | ICD-10-CM | POA: Diagnosis not present

## 2013-03-31 DIAGNOSIS — I2510 Atherosclerotic heart disease of native coronary artery without angina pectoris: Secondary | ICD-10-CM | POA: Diagnosis not present

## 2013-03-31 DIAGNOSIS — Z8639 Personal history of other endocrine, nutritional and metabolic disease: Secondary | ICD-10-CM | POA: Insufficient documentation

## 2013-03-31 DIAGNOSIS — R109 Unspecified abdominal pain: Secondary | ICD-10-CM

## 2013-03-31 DIAGNOSIS — Z79899 Other long term (current) drug therapy: Secondary | ICD-10-CM | POA: Diagnosis not present

## 2013-03-31 DIAGNOSIS — Z8781 Personal history of (healed) traumatic fracture: Secondary | ICD-10-CM | POA: Insufficient documentation

## 2013-03-31 DIAGNOSIS — Z7982 Long term (current) use of aspirin: Secondary | ICD-10-CM | POA: Diagnosis not present

## 2013-03-31 DIAGNOSIS — R10819 Abdominal tenderness, unspecified site: Secondary | ICD-10-CM | POA: Diagnosis not present

## 2013-03-31 DIAGNOSIS — K219 Gastro-esophageal reflux disease without esophagitis: Secondary | ICD-10-CM | POA: Diagnosis not present

## 2013-03-31 DIAGNOSIS — Z87448 Personal history of other diseases of urinary system: Secondary | ICD-10-CM | POA: Diagnosis not present

## 2013-03-31 DIAGNOSIS — K2970 Gastritis, unspecified, without bleeding: Secondary | ICD-10-CM | POA: Diagnosis not present

## 2013-03-31 DIAGNOSIS — Z862 Personal history of diseases of the blood and blood-forming organs and certain disorders involving the immune mechanism: Secondary | ICD-10-CM | POA: Diagnosis not present

## 2013-03-31 DIAGNOSIS — R1031 Right lower quadrant pain: Secondary | ICD-10-CM | POA: Insufficient documentation

## 2013-03-31 DIAGNOSIS — Z8659 Personal history of other mental and behavioral disorders: Secondary | ICD-10-CM | POA: Diagnosis not present

## 2013-03-31 NOTE — ED Notes (Signed)
PT. ARRIVED WITH EMS FROM HOME REPORTS PAIN AT RIGHT INGUINAL HERNIA ONSET THIS EVENING , DENIES INJURY , AMBULATORY.

## 2013-03-31 NOTE — ED Notes (Signed)
Pt reports that he was dx with right inguinal hernia. Was told to return if hernia ever became hard, or unable to move. Pt reports tonight while getting ready for bed he palpated hernia, and noticed "it is a ball." Reporting 6/10 pain at this time.
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
## 2013-04-01 NOTE — ED Notes (Signed)
MD Zammitt at bedside. Able to push right inguinal hernia in. Pt tolerated well. Will continue to monitor pt.

## 2013-04-01 NOTE — ED Notes (Signed)
AC contacted about providing pt with cab voucher.

## 2013-04-01 NOTE — ED Provider Notes (Signed)
History     CSN: [id number]  Arrival date & time 03/31/13  2334   First MD Initiated Contact with Patient 04/01/13 0006      Chief Complaint  Patient presents with  . Inguinal Hernia    (Consider location/radiation/quality/duration/timing/severity/associated sxs/prior treatment) Patient is a 79 y.o. male presenting with abdominal pain. The history is provided by the patient (the pt complains of an inquinal hernia).  Abdominal Pain Pain location:  RLQ Pain quality: aching   Pain radiates to:  Does not radiate Pain severity:  Mild Onset quality:  Gradual Timing:  Constant Context: not alcohol use     Past Medical History  Diagnosis Date  . Hypertension   . Hyperlipemia   . Inguinal hernia without mention of obstruction or gangrene, unilateral or unspecified, (not specified as recurrent)   . CKD (chronic kidney disease), stage III   . Anxiety   . Osteopenia 2002  . BPH (benign prostatic hyperplasia)   . Osteoarthritis   . Gout   . CAD (coronary artery disease)   . GERD (gastroesophageal reflux disease)   . Vision disturbance   . Migraine equivalent   . Lumbar radiculopathy   . Vertigo 1999    negative ct scan head; rare recurrance, last in 2013  . Bleeding from the nose 2009    cautery by Dr. Erik Obey  . Cataract     right eye  . Impaired fasting glucose   . Skin cancer of forehead     left (BCE)  . Distal radius fracture 2013    Dr. Fredna Dow  . Major depressive disorder, single episode, unspecified     Past Surgical History  Procedure Laterality Date  . Tonsillectomy      at age 43 yrs old    Family History  Problem Relation Age of Onset  . Heart disease Mother   . Heart disease Father   . Cancer Sister     breast     History  Substance Use Topics  . Smoking status: Never Smoker   . Smokeless tobacco: Never Used  . Alcohol Use: Yes     Comment: occasional      Review of Systems  Gastrointestinal: Positive for abdominal pain.    Allergies   Penicillins  Home Medications   Current Outpatient Rx  Name  Route  Sig  Dispense  Refill  . aspirin EC 81 MG tablet   Oral   Take 81 mg by mouth every morning.         . cholecalciferol (VITAMIN D) 1000 UNITS tablet   Oral   Take 1,000 Units by mouth every morning.          . fluticasone (FLONASE) 50 MCG/ACT nasal spray   Nasal   Place 2 sprays into the nose daily.         . Glucosamine 500 MG CAPS   Oral   Take 500 mg by mouth daily.          Marland Kitchen lisinopril (PRINIVIL,ZESTRIL) 20 MG tablet   Oral   Take 10 mg by mouth 2 (two) times daily.          . metoprolol tartrate (LOPRESSOR) 25 MG tablet   Oral   Take 12.5 mg by mouth 2 (two) times daily.         . Multiple Vitamins-Minerals (MULTIVITAMIN PO)   Oral   Take 1 tablet by mouth daily.          Marland Kitchen omeprazole (PRILOSEC) 20  MG capsule   Oral   Take 40 mg by mouth every morning.          . simvastatin (ZOCOR) 40 MG tablet   Oral   Take 20 mg by mouth at bedtime.         . tamsulosin (FLOMAX) 0.4 MG CAPS   Oral   Take 0.4 mg by mouth daily after supper.           BP 199/85  Pulse 63  Temp(Src) 98.5 F (36.9 C) (Oral)  Resp 18  SpO2 98%  Physical Exam  Constitutional: He is oriented to person, place, and time. He appears well-developed.  HENT:  Head: Normocephalic.  Eyes: Conjunctivae and EOM are normal. No scleral icterus.  Neck: Neck supple. No thyromegaly present.  Cardiovascular: Normal rate and regular rhythm.  Exam reveals no gallop and no friction rub.   No murmur heard. Pulmonary/Chest: No stridor. He has no wheezes. He has no rales. He exhibits no tenderness.  Abdominal: He exhibits no distension. There is tenderness. There is no rebound.  Tender right inquinal hernia  Musculoskeletal: Normal range of motion. He exhibits no edema.  Lymphadenopathy:    He has no cervical adenopathy.  Neurological: He is oriented to person, place, and time. Coordination normal.  Skin: No  rash noted. No erythema.  Psychiatric: He has a normal mood and affect. His behavior is normal.    ED Course  Procedures (including critical care time)  Labs Reviewed - No data to display No results found.   1. Abdominal pain    inquinal hernia reduced with palpation   MDM          Maudry Diego, MD 04/01/13 [phone]

## 2013-04-10 DIAGNOSIS — I10 Essential (primary) hypertension: Secondary | ICD-10-CM | POA: Diagnosis not present

## 2013-04-13 ENCOUNTER — Ambulatory Visit (INDEPENDENT_AMBULATORY_CARE_PROVIDER_SITE_OTHER): Payer: Medicare Other | Admitting: General Surgery

## 2013-04-13 ENCOUNTER — Encounter (INDEPENDENT_AMBULATORY_CARE_PROVIDER_SITE_OTHER): Payer: Self-pay | Admitting: General Surgery

## 2013-04-13 VITALS — BP 140/76 | HR 70 | Temp 98.8°F | Resp 16 | Ht 66.0 in | Wt 142.0 lb

## 2013-04-13 DIAGNOSIS — K4090 Unilateral inguinal hernia, without obstruction or gangrene, not specified as recurrent: Secondary | ICD-10-CM | POA: Diagnosis not present
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
## 2013-04-13 NOTE — Progress Notes (Signed)
Patient ID: Larry Mcfarland, male   DOB: Dec 29, 1920, 79 y.o.   MRN: [id number]  Chief Complaint  Patient presents with  . Pre-op Exam    discuss hernia repair    HPI Larry Mcfarland is a 79 y.o. male.  He returns to discuss his right inguinal hernia and to request surgery.  He had an episode of incarceration 2 weeks ago. He said it felt like a hard golf ball. Wasn't really hurting much but was very alarming he could not push it back in. He went to the emergency department and a physician had to forcibly reduce it. He has been fine since. He has not play golf since.  Recall that he does not have these major medical problems and seems to be stable and independent. He does have hypertension, mild chronic kidney disease, BPH with nocturia, anxiety, coronary artery disease, and a single episode of atrial fibrillation in the past. He takes aspirin, Flonase, Zestril, beta blockers, simvastatin, Flomax, and Prilosec.  HPI  Past Medical History  Diagnosis Date  . Hypertension   . Hyperlipemia   . Inguinal hernia without mention of obstruction or gangrene, unilateral or unspecified, (not specified as recurrent)   . CKD (chronic kidney disease), stage III   . Anxiety   . Osteopenia 2002  . BPH (benign prostatic hyperplasia)   . Osteoarthritis   . Gout   . CAD (coronary artery disease)   . GERD (gastroesophageal reflux disease)   . Vision disturbance   . Migraine equivalent   . Lumbar radiculopathy   . Vertigo 1999    negative ct scan head; rare recurrance, last in 2013  . Bleeding from the nose 2009    cautery by Dr. Erik Obey  . Cataract     right eye  . Impaired fasting glucose   . Skin cancer of forehead     left (BCE)  . Distal radius fracture 2013    Dr. Fredna Dow  . Major depressive disorder, single episode, unspecified     Past Surgical History  Procedure Laterality Date  . Tonsillectomy      at age 5 yrs old    Family History  Problem Relation Age of Onset  . Heart  disease Mother   . Heart disease Father   . Cancer Sister     breast     Social History History  Substance Use Topics  . Smoking status: Never Smoker   . Smokeless tobacco: Never Used  . Alcohol Use: Yes     Comment: occasional    Allergies  Allergen Reactions  . Penicillins Nausea Only    Upset stomach    Current Outpatient Prescriptions  Medication Sig Dispense Refill  . aspirin EC 81 MG tablet Take 81 mg by mouth every morning.      . cholecalciferol (VITAMIN D) 1000 UNITS tablet Take 1,000 Units by mouth every morning.       . fluticasone (FLONASE) 50 MCG/ACT nasal spray Place 2 sprays into the nose daily.      . Glucosamine 500 MG CAPS Take 500 mg by mouth daily.       Marland Kitchen lisinopril (PRINIVIL,ZESTRIL) 20 MG tablet Take 10 mg by mouth 2 (two) times daily.       . metoprolol tartrate (LOPRESSOR) 25 MG tablet Take 12.5 mg by mouth 2 (two) times daily.      . Multiple Vitamins-Minerals (MULTIVITAMIN PO) Take 1 tablet by mouth daily.       Marland Kitchen omeprazole (PRILOSEC)  20 MG capsule Take 40 mg by mouth every morning.       . simvastatin (ZOCOR) 40 MG tablet Take 20 mg by mouth at bedtime.      . tamsulosin (FLOMAX) 0.4 MG CAPS Take 0.4 mg by mouth daily after supper.       No current facility-administered medications for this visit.    Review of Systems Review of Systems  Constitutional: Negative for fever, chills and unexpected weight change.  HENT: Negative for hearing loss, congestion, sore throat, trouble swallowing and voice change.   Eyes: Negative for visual disturbance.  Respiratory: Negative for cough and wheezing.   Cardiovascular: Negative for chest pain, palpitations and leg swelling.  Gastrointestinal: Negative for nausea, vomiting, abdominal pain, diarrhea, constipation, blood in stool, abdominal distention, anal bleeding and rectal pain.  Genitourinary: Positive for frequency and difficulty urinating. Negative for hematuria.  Musculoskeletal: Negative for  arthralgias.  Skin: Negative for rash and wound.  Neurological: Negative for seizures, syncope, weakness and headaches.  Hematological: Negative for adenopathy. Does not bruise/bleed easily.  Psychiatric/Behavioral: Negative for confusion.    Blood pressure 140/76, pulse 70, temperature 98.8 F (37.1 C), resp. rate 16, height 5\' 6"  (1.676 m), weight 142 lb (64.411 kg).  Physical Exam Physical Exam  Constitutional: He is oriented to person, place, and time. He appears well-developed and well-nourished. No distress.  HENT:  Head: Normocephalic.  Nose: Nose normal.  Mouth/Throat: No oropharyngeal exudate.  Eyes: Conjunctivae and EOM are normal. Pupils are equal, round, and reactive to light. Right eye exhibits no discharge. Left eye exhibits no discharge. No scleral icterus.  Neck: Normal range of motion. Neck supple. No JVD present. No tracheal deviation present. No thyromegaly present.  Cardiovascular: Normal rate, regular rhythm, normal heart sounds and intact distal pulses.   No murmur heard. Pulmonary/Chest: Effort normal and breath sounds normal. No stridor. No respiratory distress. He has no wheezes. He has no rales. He exhibits no tenderness.  Abdominal: Soft. Bowel sounds are normal. He exhibits no distension and no mass. There is no tenderness. There is no rebound and no guarding.  Umbilicus normal. No hernia there. No organomegaly.  Genitourinary:  Small to medium size right inguinal hernia, reducible. Does not extend into the scrotum scrotum. No hernia on the left side.  Musculoskeletal: Normal range of motion. He exhibits no edema and no tenderness.  Lymphadenopathy:    He has no cervical adenopathy.  Neurological: He is alert and oriented to person, place, and time. He has normal reflexes. Coordination normal.  Skin: Skin is warm and dry. No rash noted. He is not diaphoretic. No erythema. No pallor.  Psychiatric: He has a normal mood and affect. His behavior is normal.  Judgment and thought content normal.    Data Reviewed   Assessment    Reducible right inguinal hernia. Recent episode of incarceration requiring emergency department management. Increased risk for incarceration and strangulation the future  Hypertension Chronic kidney disease BPH Anxiety Coronary disease GERD Single episode of atrial fibrillation in the past     Plan    The patient will be scheduled for laparoscopic repair of his right inguinal hernia with mesh, possible open in the future. This was his  preference in terms of technique. This will have to be done under general anesthesia.  Stop aspirin 5 days preop  We will observe overnight in the hospital for complications and urinary retention  I discussed the indications, details, techniques, numerous risk of the surgery with him. He  understands all these issues and all his questions are answered. He completely agrees with this plan.        Edsel Petrin. Dalbert Batman, M.D., [facility] General and Minimally invasive Surgery Breast and Colorectal Surgery Office:   [phone] Pager:   [phone]  04/13/2013, 10:41 AM

## 2013-04-17 DIAGNOSIS — G479 Sleep disorder, unspecified: Secondary | ICD-10-CM | POA: Diagnosis not present

## 2013-04-17 DIAGNOSIS — N183 Chronic kidney disease, stage 3 unspecified: Secondary | ICD-10-CM | POA: Diagnosis not present

## 2013-04-17 DIAGNOSIS — R141 Gas pain: Secondary | ICD-10-CM | POA: Diagnosis not present

## 2013-04-17 DIAGNOSIS — R142 Eructation: Secondary | ICD-10-CM | POA: Diagnosis not present

## 2013-04-17 DIAGNOSIS — I10 Essential (primary) hypertension: Secondary | ICD-10-CM | POA: Diagnosis not present

## 2013-05-12 ENCOUNTER — Telehealth (INDEPENDENT_AMBULATORY_CARE_PROVIDER_SITE_OTHER): Payer: Self-pay | Admitting: General Surgery

## 2013-05-12 NOTE — Telephone Encounter (Signed)
Pt called to ask if Dr. Dalbert Batman wanted to see him again in the office before his surgery (scheduled for 06/07/13.)  He is going out-of-town for a 10 year family reunion, from 05/25/13-05/29/13.  Pt denies anything new or worse with his hernia, and no new questions about it or the surgery.  Explained he will not need to return for an office visit pre-op.

## 2013-05-17 DIAGNOSIS — R350 Frequency of micturition: Secondary | ICD-10-CM | POA: Diagnosis not present

## 2013-05-17 DIAGNOSIS — R3915 Urgency of urination: Secondary | ICD-10-CM | POA: Diagnosis not present

## 2013-05-31 ENCOUNTER — Encounter (HOSPITAL_COMMUNITY): Payer: Self-pay | Admitting: Pharmacy Technician

## 2013-06-05 ENCOUNTER — Ambulatory Visit (HOSPITAL_COMMUNITY)
Admission: RE | Admit: 2013-06-05 | Discharge: 2013-06-05 | Disposition: A | Discharge status: Home / Self Care | Payer: Medicare Other | Source: Ambulatory Visit | Attending: General Surgery | Admitting: General Surgery

## 2013-06-05 ENCOUNTER — Encounter (HOSPITAL_COMMUNITY): Payer: Self-pay

## 2013-06-05 ENCOUNTER — Encounter (HOSPITAL_COMMUNITY)
Admission: RE | Admit: 2013-06-05 | Discharge: 2013-06-05 | Disposition: A | Discharge status: Home / Self Care | Payer: Medicare Other | Source: Ambulatory Visit | Attending: General Surgery | Admitting: General Surgery

## 2013-06-05 DIAGNOSIS — K4090 Unilateral inguinal hernia, without obstruction or gangrene, not specified as recurrent: Secondary | ICD-10-CM | POA: Insufficient documentation

## 2013-06-05 DIAGNOSIS — Z01812 Encounter for preprocedural laboratory examination: Secondary | ICD-10-CM | POA: Insufficient documentation

## 2013-06-05 DIAGNOSIS — Z01818 Encounter for other preprocedural examination: Secondary | ICD-10-CM | POA: Insufficient documentation

## 2013-06-05 DIAGNOSIS — Z01810 Encounter for preprocedural cardiovascular examination: Secondary | ICD-10-CM | POA: Insufficient documentation

## 2013-06-05 DIAGNOSIS — J449 Chronic obstructive pulmonary disease, unspecified: Secondary | ICD-10-CM | POA: Diagnosis not present

## 2013-06-05 DIAGNOSIS — J4489 Other specified chronic obstructive pulmonary disease: Secondary | ICD-10-CM | POA: Insufficient documentation

## 2013-06-05 DIAGNOSIS — R040 Epistaxis: Secondary | ICD-10-CM | POA: Diagnosis not present

## 2013-06-05 DIAGNOSIS — K449 Diaphragmatic hernia without obstruction or gangrene: Secondary | ICD-10-CM | POA: Insufficient documentation

## 2013-06-05 HISTORY — DX: Cardiac arrhythmia, unspecified: I49.9

## 2013-06-05 HISTORY — DX: Epistaxis: R04.0

## 2013-06-05 LAB — CBC WITH DIFFERENTIAL/PLATELET
Basophils Absolute: 0 10*3/uL (ref 0.0–0.1)
Basophils Relative: 0 % (ref 0–1)
Eosinophils Absolute: 0.1 10*3/uL (ref 0.0–0.7)
Eosinophils Relative: 1 % (ref 0–5)
HCT: 37.6 % — ABNORMAL LOW (ref 39.0–52.0)
Hemoglobin: 12.6 g/dL — ABNORMAL LOW (ref 13.0–17.0)
Lymphocytes Relative: 14 % (ref 12–46)
Lymphs Abs: 1 10*3/uL (ref 0.7–4.0)
MCH: 32.6 pg (ref 26.0–34.0)
MCHC: 33.5 g/dL (ref 30.0–36.0)
MCV: 97.4 fL (ref 78.0–100.0)
Monocytes Absolute: 1 10*3/uL (ref 0.1–1.0)
Monocytes Relative: 13 % — ABNORMAL HIGH (ref 3–12)
Neutro Abs: 5.3 10*3/uL (ref 1.7–7.7)
Neutrophils Relative %: 72 % (ref 43–77)
Platelets: 191 10*3/uL (ref 150–400)
RBC: 3.86 MIL/uL — ABNORMAL LOW (ref 4.22–5.81)
RDW: 13.2 % (ref 11.5–15.5)
WBC: 7.5 10*3/uL (ref 4.0–10.5)

## 2013-06-05 LAB — COMPREHENSIVE METABOLIC PANEL
ALT: 19 U/L (ref 0–53)
AST: 24 U/L (ref 0–37)
Albumin: 3.8 g/dL (ref 3.5–5.2)
Alkaline Phosphatase: 90 U/L (ref 39–117)
BUN: 24 mg/dL — ABNORMAL HIGH (ref 6–23)
CO2: 28 mEq/L (ref 19–32)
Calcium: 9.5 mg/dL (ref 8.4–10.5)
Chloride: 104 mEq/L (ref 96–112)
Creatinine, Ser: 1.54 mg/dL — ABNORMAL HIGH (ref 0.50–1.35)
GFR calc Af Amer: 44 mL/min — ABNORMAL LOW (ref >90)
GFR calc non Af Amer: 38 mL/min — ABNORMAL LOW (ref >90)
Glucose, Bld: 90 mg/dL (ref 70–99)
Potassium: 4.4 mEq/L (ref 3.5–5.1)
Sodium: 139 mEq/L (ref 135–145)
Total Bilirubin: 0.3 mg/dL (ref 0.3–1.2)
Total Protein: 6.6 g/dL (ref 6.0–8.3)

## 2013-06-05 LAB — URINALYSIS, ROUTINE W REFLEX MICROSCOPIC
Bilirubin Urine: NEGATIVE
Glucose, UA: NEGATIVE mg/dL
Hgb urine dipstick: NEGATIVE
Ketones, ur: NEGATIVE mg/dL
Leukocytes, UA: NEGATIVE
Nitrite: NEGATIVE
Protein, ur: NEGATIVE mg/dL
Specific Gravity, Urine: 1.02 (ref 1.005–1.030)
Urobilinogen, UA: 1 mg/dL (ref 0.0–1.0)
pH: 5 (ref 5.0–8.0)

## 2013-06-05 NOTE — H&P (Signed)
Larry Mcfarland   MRN:  [id number]   Description: 79 year old male  Provider: Adin Hector, MD  Department: [facility]        Diagnoses    Right inguinal hernia    -  Primary    550.90            Current Vitals - Last Recorded    BP Pulse Temp(Src) Resp Ht Wt    140/76 70 98.8 F (37.1 C) 16 5\' 6"  (1.676 m) 142 lb (64.411 kg)   BMI - 22.93 kg/m2                 History and Physical   Adin Hector, MD    Status: Signed                        HPI Larry Mcfarland is a 79 y.o. male.  He returns to discuss his right inguinal hernia and to request surgery.   He had an episode of incarceration 2 weeks ago. He said it felt like a hard golf ball. Wasn't really hurting much but was very alarming he could not push it back in. He went to the emergency department and a physician had to forcibly reduce it. He has been fine since. He has not play golf since.   Recall that he does not have these major medical problems and seems to be stable and independent. He does have hypertension, mild chronic kidney disease, BPH with nocturia, anxiety, coronary artery disease, and a single episode of atrial fibrillation in the past. He takes aspirin, Flonase, Zestril, beta blockers, simvastatin, Flomax, and Prilosec.        Past Medical History   Diagnosis  Date   .  Hypertension     .  Hyperlipemia     .  Inguinal hernia without mention of obstruction or gangrene, unilateral or unspecified, (not specified as recurrent)     .  CKD (chronic kidney disease), stage III     .  Anxiety     .  Osteopenia  2002   .  BPH (benign prostatic hyperplasia)     .  Osteoarthritis     .  Gout     .  CAD (coronary artery disease)     .  GERD (gastroesophageal reflux disease)     .  Vision disturbance     .  Migraine equivalent     .  Lumbar radiculopathy     .  Vertigo  1999       negative ct scan head; rare recurrance, last in 2013   .  Bleeding from the nose  2009        cautery by Dr. Erik Obey   .  Cataract         right eye   .  Impaired fasting glucose     .  Skin cancer of forehead         left (BCE)   .  Distal radius fracture  2013       Dr. Fredna Dow   .  Major depressive disorder, single episode, unspecified           Past Surgical History   Procedure  Laterality  Date   .  Tonsillectomy           at age 36 yrs old         Family History   Problem  Relation  Age of Onset   .  Heart disease  Mother     .  Heart disease  Father     .  Cancer  Sister         breast         Social History History   Substance Use Topics   .  Smoking status:  Never Smoker    .  Smokeless tobacco:  Never Used   .  Alcohol Use:  Yes         Comment: occasional         Allergies   Allergen  Reactions   .  Penicillins  Nausea Only       Upset stomach         Current Outpatient Prescriptions   Medication  Sig  Dispense  Refill   .  aspirin EC 81 MG tablet  Take 81 mg by mouth every morning.         .  cholecalciferol (VITAMIN D) 1000 UNITS tablet  Take 1,000 Units by mouth every morning.          .  fluticasone (FLONASE) 50 MCG/ACT nasal spray  Place 2 sprays into the nose daily.         .  Glucosamine 500 MG CAPS  Take 500 mg by mouth daily.          Marland Kitchen  lisinopril (PRINIVIL,ZESTRIL) 20 MG tablet  Take 10 mg by mouth 2 (two) times daily.          .  metoprolol tartrate (LOPRESSOR) 25 MG tablet  Take 12.5 mg by mouth 2 (two) times daily.         .  Multiple Vitamins-Minerals (MULTIVITAMIN PO)  Take 1 tablet by mouth daily.          Marland Kitchen  omeprazole (PRILOSEC) 20 MG capsule  Take 40 mg by mouth every morning.          .  simvastatin (ZOCOR) 40 MG tablet  Take 20 mg by mouth at bedtime.         .  tamsulosin (FLOMAX) 0.4 MG CAPS  Take 0.4 mg by mouth daily after supper.             No current facility-administered medications for this visit.        Review of Systems s  Constitutional: Negative for fever, chills and unexpected weight  change.  HENT: Negative for hearing loss, congestion, sore throat, trouble swallowing and voice change.   Eyes: Negative for visual disturbance.  Respiratory: Negative for cough and wheezing.   Cardiovascular: Negative for chest pain, palpitations and leg swelling.  Gastrointestinal: Negative for nausea, vomiting, abdominal pain, diarrhea, constipation, blood in stool, abdominal distention, anal bleeding and rectal pain.  Genitourinary: Positive for frequency and difficulty urinating. Negative for hematuria.  Musculoskeletal: Negative for arthralgias.  Skin: Negative for rash and wound.  Neurological: Negative for seizures, syncope, weakness and headaches.  Hematological: Negative for adenopathy. Does not bruise/bleed easily.  Psychiatric/Behavioral: Negative for confusion.      Blood pressure 140/76, pulse 70, temperature 98.8 F (37.1 C), resp. rate 16, height 5\' 6"  (1.676 m), weight 142 lb (64.411 kg).   Physical Exam   Constitutional: He is oriented to person, place, and time. He appears well-developed and well-nourished. No distress.  HENT:   Head: Normocephalic.   Nose: Nose normal.   Mouth/Throat: No oropharyngeal exudate.  Eyes: Conjunctivae and EOM are normal. Pupils are equal,  round, and reactive to light. Right eye exhibits no discharge. Left eye exhibits no discharge. No scleral icterus.  Neck: Normal range of motion. Neck supple. No JVD present. No tracheal deviation present. No thyromegaly present.  Cardiovascular: Normal rate, regular rhythm, normal heart sounds and intact distal pulses.    No murmur heard. Pulmonary/Chest: Effort normal and breath sounds normal. No stridor. No respiratory distress. He has no wheezes. He has no rales. He exhibits no tenderness.  Abdominal: Soft. Bowel sounds are normal. He exhibits no distension and no mass. There is no tenderness. There is no rebound and no guarding.  Umbilicus normal. No hernia there. No organomegaly.   Genitourinary:  Small to medium size right inguinal hernia, reducible. Does not extend into the scrotum scrotum. No hernia on the left side.  Musculoskeletal: Normal range of motion. He exhibits no edema and no tenderness.  Lymphadenopathy:    He has no cervical adenopathy.  Neurological: He is alert and oriented to person, place, and time. He has normal reflexes. Coordination normal.  Skin: Skin is warm and dry. No rash noted. He is not diaphoretic. No erythema. No pallor.  Psychiatric: He has a normal mood and affect. His behavior is normal. Judgment and thought content normal.      Data Reviewed     Assessment    Reducible right inguinal hernia. Recent episode of incarceration requiring emergency department management. Increased risk for incarceration and strangulation the future   Hypertension Chronic kidney disease BPH Anxiety Coronary disease GERD Single episode of atrial fibrillation in the past      Plan    The patient will be scheduled for laparoscopic repair of his right inguinal hernia with mesh, possible open in the future. This was his  preference in terms of technique. This will have to be done under general anesthesia.   Stop aspirin 5 days preop   We will observe overnight in the hospital for complications and urinary retention   I discussed the indications, details, techniques, numerous risk of the surgery with him. He understands all these issues and all his questions are answered. He completely agrees with this plan.           Edsel Petrin. Dalbert Batman, M.D., [facility] General and Minimally invasive Surgery Breast and Colorectal Surgery Office:   [phone] Pager:   [phone]

## 2013-06-05 NOTE — Patient Instructions (Signed)
KEYO OPALEWSKI  06/05/2013   Your procedure is scheduled on:  06/07/13   Report to [facility] at    [place]  AM.  Call this number if you have problems the morning of surgery: [phone]   Remember:   Do not eat food or drink liquids after midnight.   Take these medicines the morning of surgery with A SIP OF WATER:    Do not wear jewelry,   Do not wear lotions, powders, or perfumes..   Men may shave face and neck.  Do not bring valuables to the hospital.  Contacts, dentures or bridgework may not be worn into surgery.  Leave suitcase in the car. After surgery it may be brought to your room.  For patients admitted to the hospital, checkout time is 11:00 AM the day of  discharge.       SEE CHG INSTRUCTION SHEET    Please read over the following fact sheets that you were given:  coughing and deep breathing exercises, leg exercises               Failure to comply with these instructions may result in cancellation of your surgery.                Patient Signature ____________________________              Nurse Signature _____________________________

## 2013-06-05 NOTE — Progress Notes (Signed)
Penicillins cause upset stomach and nausea.  Ancef ordered preop.

## 2013-06-06 ENCOUNTER — Telehealth (INDEPENDENT_AMBULATORY_CARE_PROVIDER_SITE_OTHER): Payer: Self-pay | Admitting: *Deleted

## 2013-06-06 NOTE — Telephone Encounter (Signed)
FYI: Patient called to state he saw ENT yesterday due to some bloody noses recently.  Patient states they cauterized the bleeding yesterday.  The ENT MD told patient yesterday this would not interfer with his surgery tomorrow at all but patient wanted to make Dr. Dalbert Batman aware.

## 2013-06-06 NOTE — Telephone Encounter (Signed)
Thanks for the update.  hmi

## 2013-06-07 ENCOUNTER — Encounter (HOSPITAL_COMMUNITY): Payer: Self-pay | Admitting: Registered Nurse

## 2013-06-07 ENCOUNTER — Ambulatory Visit (HOSPITAL_COMMUNITY): Payer: Medicare Other | Admitting: Registered Nurse

## 2013-06-07 ENCOUNTER — Observation Stay (HOSPITAL_COMMUNITY)
Admission: RE | Admit: 2013-06-07 | Discharge: 2013-06-09 | Disposition: A | Discharge status: Home / Self Care | Payer: Medicare Other | Source: Ambulatory Visit | Attending: General Surgery | Admitting: General Surgery

## 2013-06-07 ENCOUNTER — Encounter (HOSPITAL_COMMUNITY)
Admission: RE | Disposition: A | Discharge status: Home / Self Care | Payer: Self-pay | Source: Ambulatory Visit | Attending: General Surgery

## 2013-06-07 DIAGNOSIS — R339 Retention of urine, unspecified: Secondary | ICD-10-CM | POA: Insufficient documentation

## 2013-06-07 DIAGNOSIS — I4891 Unspecified atrial fibrillation: Secondary | ICD-10-CM | POA: Diagnosis not present

## 2013-06-07 DIAGNOSIS — N183 Chronic kidney disease, stage 3 unspecified: Secondary | ICD-10-CM | POA: Diagnosis not present

## 2013-06-07 DIAGNOSIS — I10 Essential (primary) hypertension: Secondary | ICD-10-CM | POA: Diagnosis not present

## 2013-06-07 DIAGNOSIS — K4090 Unilateral inguinal hernia, without obstruction or gangrene, not specified as recurrent: Principal | ICD-10-CM | POA: Insufficient documentation

## 2013-06-07 DIAGNOSIS — Z79899 Other long term (current) drug therapy: Secondary | ICD-10-CM | POA: Insufficient documentation

## 2013-06-07 DIAGNOSIS — E785 Hyperlipidemia, unspecified: Secondary | ICD-10-CM | POA: Diagnosis not present

## 2013-06-07 DIAGNOSIS — M1990 Unspecified osteoarthritis, unspecified site: Secondary | ICD-10-CM | POA: Insufficient documentation

## 2013-06-07 DIAGNOSIS — R351 Nocturia: Secondary | ICD-10-CM | POA: Diagnosis not present

## 2013-06-07 DIAGNOSIS — K219 Gastro-esophageal reflux disease without esophagitis: Secondary | ICD-10-CM | POA: Diagnosis not present

## 2013-06-07 DIAGNOSIS — I2510 Atherosclerotic heart disease of native coronary artery without angina pectoris: Secondary | ICD-10-CM | POA: Insufficient documentation

## 2013-06-07 DIAGNOSIS — N400 Enlarged prostate without lower urinary tract symptoms: Secondary | ICD-10-CM | POA: Insufficient documentation

## 2013-06-07 DIAGNOSIS — I129 Hypertensive chronic kidney disease with stage 1 through stage 4 chronic kidney disease, or unspecified chronic kidney disease: Secondary | ICD-10-CM | POA: Diagnosis not present

## 2013-06-07 HISTORY — PX: INSERTION OF MESH: SHX5868

## 2013-06-07 HISTORY — PX: INGUINAL HERNIA REPAIR: SHX194

## 2013-06-07 SURGERY — REPAIR, HERNIA, INGUINAL, LAPAROSCOPIC
Anesthesia: General | Site: Abdomen | Laterality: Right | Wound class: Clean

## 2013-06-07 MED ORDER — ROCURONIUM BROMIDE 100 MG/10ML IV SOLN
INTRAVENOUS | Status: DC | PRN
  Administered 2013-06-07: 20 mg via INTRAVENOUS

## 2013-06-07 MED ORDER — BUPIVACAINE-EPINEPHRINE 0.5% -1:200000 IJ SOLN
INTRAMUSCULAR | Status: DC | PRN
  Administered 2013-06-07: 20 mL

## 2013-06-07 MED ORDER — ONDANSETRON HCL 4 MG/2ML IJ SOLN
INTRAMUSCULAR | Status: DC | PRN
  Administered 2013-06-07: 4 mg via INTRAVENOUS

## 2013-06-07 MED ORDER — LACTATED RINGERS IV SOLN
INTRAVENOUS | Status: DC | PRN
  Administered 2013-06-07: 08:00:00 via INTRAVENOUS

## 2013-06-07 MED ORDER — GLYCOPYRROLATE 0.2 MG/ML IJ SOLN
INTRAMUSCULAR | Status: DC | PRN
  Administered 2013-06-07: 0.2 mg via INTRAVENOUS
  Administered 2013-06-07: .6 mg via INTRAVENOUS

## 2013-06-07 MED ORDER — PROMETHAZINE HCL 25 MG/ML IJ SOLN: 6.2500 mg | INTRAMUSCULAR | Status: DC | PRN

## 2013-06-07 MED ORDER — SIMVASTATIN 20 MG PO TABS
20.0000 mg | ORAL_TABLET | Freq: Every day | ORAL | Status: DC
  Administered 2013-06-07 – 2013-06-08 (×2): 20 mg via ORAL
  Filled 2013-06-07 (×3): qty 1

## 2013-06-07 MED ORDER — CHLORHEXIDINE GLUCONATE 4 % EX LIQD
1.0000 "application " | Freq: Once | CUTANEOUS | Status: DC
  Filled 2013-06-07: qty 15

## 2013-06-07 MED ORDER — HYDROMORPHONE HCL PF 1 MG/ML IJ SOLN: 0.2500 mg | INTRAMUSCULAR | Status: DC | PRN

## 2013-06-07 MED ORDER — LISINOPRIL 10 MG PO TABS
10.0000 mg | ORAL_TABLET | Freq: Two times a day (BID) | ORAL | Status: DC
  Administered 2013-06-07 – 2013-06-09 (×4): 10 mg via ORAL
  Filled 2013-06-07 (×5): qty 1

## 2013-06-07 MED ORDER — METOPROLOL TARTRATE 12.5 MG HALF TABLET
12.5000 mg | ORAL_TABLET | Freq: Two times a day (BID) | ORAL | Status: DC
  Administered 2013-06-07 – 2013-06-09 (×4): 12.5 mg via ORAL
  Filled 2013-06-07 (×5): qty 1

## 2013-06-07 MED ORDER — NEOSTIGMINE METHYLSULFATE 1 MG/ML IJ SOLN
INTRAMUSCULAR | Status: DC | PRN
  Administered 2013-06-07: 4 mg via INTRAVENOUS

## 2013-06-07 MED ORDER — OXYCODONE HCL 5 MG/5ML PO SOLN
5.0000 mg | Freq: Once | ORAL | Status: DC | PRN
  Filled 2013-06-07: qty 5

## 2013-06-07 MED ORDER — ALPRAZOLAM 0.25 MG PO TABS
0.0625 mg | ORAL_TABLET | Freq: Every evening | ORAL | Status: DC | PRN
  Administered 2013-06-07 – 2013-06-08 (×2): 0.125 mg via ORAL
  Filled 2013-06-07 (×2): qty 1

## 2013-06-07 MED ORDER — MORPHINE SULFATE 2 MG/ML IJ SOLN
1.0000 mg | INTRAMUSCULAR | Status: DC | PRN
  Administered 2013-06-07: 2 mg via INTRAVENOUS
  Filled 2013-06-07: qty 1

## 2013-06-07 MED ORDER — FENTANYL CITRATE 0.05 MG/ML IJ SOLN
INTRAMUSCULAR | Status: DC | PRN
  Administered 2013-06-07 (×3): 50 ug via INTRAVENOUS

## 2013-06-07 MED ORDER — HEPARIN SODIUM (PORCINE) 5000 UNIT/ML IJ SOLN
5000.0000 [IU] | Freq: Three times a day (TID) | INTRAMUSCULAR | Status: DC
  Administered 2013-06-08 – 2013-06-09 (×4): 5000 [IU] via SUBCUTANEOUS
  Filled 2013-06-07 (×7): qty 1

## 2013-06-07 MED ORDER — CEFAZOLIN SODIUM-DEXTROSE 2-3 GM-% IV SOLR
INTRAVENOUS | Status: AC
  Filled 2013-06-07: qty 50

## 2013-06-07 MED ORDER — FLUTICASONE PROPIONATE 50 MCG/ACT NA SUSP: 2.0000 | Freq: Every day | NASAL | Status: DC | PRN

## 2013-06-07 MED ORDER — TAMSULOSIN HCL 0.4 MG PO CAPS
0.4000 mg | ORAL_CAPSULE | Freq: Every day | ORAL | Status: DC
  Administered 2013-06-07 – 2013-06-08 (×2): 0.4 mg via ORAL
  Filled 2013-06-07 (×3): qty 1

## 2013-06-07 MED ORDER — ONDANSETRON HCL 4 MG PO TABS: 4.0000 mg | ORAL_TABLET | Freq: Four times a day (QID) | ORAL | Status: DC | PRN

## 2013-06-07 MED ORDER — VITAMIN D3 25 MCG (1000 UNIT) PO TABS
1000.0000 [IU] | ORAL_TABLET | Freq: Every morning | ORAL | Status: DC
  Administered 2013-06-08 – 2013-06-09 (×2): 1000 [IU] via ORAL
  Filled 2013-06-07 (×3): qty 1

## 2013-06-07 MED ORDER — OXYCODONE HCL 5 MG PO TABS: 5.0000 mg | ORAL_TABLET | Freq: Once | ORAL | Status: DC | PRN

## 2013-06-07 MED ORDER — MEPERIDINE HCL 50 MG/ML IJ SOLN: 6.2500 mg | INTRAMUSCULAR | Status: DC | PRN

## 2013-06-07 MED ORDER — PANTOPRAZOLE SODIUM 40 MG PO TBEC
40.0000 mg | DELAYED_RELEASE_TABLET | Freq: Every day | ORAL | Status: DC
  Administered 2013-06-08 – 2013-06-09 (×2): 40 mg via ORAL
  Filled 2013-06-07 (×3): qty 1

## 2013-06-07 MED ORDER — SUCCINYLCHOLINE CHLORIDE 20 MG/ML IJ SOLN
INTRAMUSCULAR | Status: DC | PRN
  Administered 2013-06-07: 100 mg via INTRAVENOUS

## 2013-06-07 MED ORDER — MORPHINE SULFATE 2 MG/ML IJ SOLN
INTRAMUSCULAR | Status: AC
  Administered 2013-06-07: 2 mg via INTRAVENOUS
  Filled 2013-06-07: qty 1

## 2013-06-07 MED ORDER — PROPOFOL 10 MG/ML IV BOLUS
INTRAVENOUS | Status: DC | PRN
  Administered 2013-06-07: 100 mg via INTRAVENOUS

## 2013-06-07 MED ORDER — ACETAMINOPHEN 10 MG/ML IV SOLN
1000.0000 mg | Freq: Once | INTRAVENOUS | Status: DC | PRN
  Filled 2013-06-07: qty 100

## 2013-06-07 MED ORDER — 0.9 % SODIUM CHLORIDE (POUR BTL) OPTIME
TOPICAL | Status: DC | PRN
  Administered 2013-06-07: 1000 mL

## 2013-06-07 MED ORDER — LACTATED RINGERS IV SOLN
INTRAVENOUS | Status: DC | PRN
  Administered 2013-06-07: 1000 mL

## 2013-06-07 MED ORDER — LIDOCAINE HCL (CARDIAC) 20 MG/ML IV SOLN
INTRAVENOUS | Status: DC | PRN
  Administered 2013-06-07: 60 mg via INTRAVENOUS

## 2013-06-07 MED ORDER — ONDANSETRON HCL 4 MG/2ML IJ SOLN: 4.0000 mg | Freq: Four times a day (QID) | INTRAMUSCULAR | Status: DC | PRN

## 2013-06-07 MED ORDER — HYDROCODONE-ACETAMINOPHEN 5-325 MG PO TABS
1.0000 | ORAL_TABLET | ORAL | Status: DC | PRN
  Administered 2013-06-07 – 2013-06-08 (×5): 1 via ORAL
  Filled 2013-06-07 (×5): qty 1

## 2013-06-07 MED ORDER — CEFAZOLIN SODIUM-DEXTROSE 2-3 GM-% IV SOLR
2.0000 g | INTRAVENOUS | Status: AC
  Administered 2013-06-07: 2 g via INTRAVENOUS

## 2013-06-07 MED ORDER — MORPHINE SULFATE 10 MG/ML IJ SOLN: 1.0000 mg | INTRAMUSCULAR | Status: DC | PRN

## 2013-06-07 MED ORDER — BUPIVACAINE-EPINEPHRINE 0.5% -1:200000 IJ SOLN
INTRAMUSCULAR | Status: AC
  Filled 2013-06-07: qty 1

## 2013-06-07 SURGICAL SUPPLY — 43 items
ADH SKN CLS APL DERMABOND .7 (GAUZE/BANDAGES/DRESSINGS) ×1
APL SKNCLS STERI-STRIP NONHPOA (GAUZE/BANDAGES/DRESSINGS)
APPLIER CLIP 5 13 M/L LIGAMAX5 (MISCELLANEOUS) ×2
APPLIER CLIP LOGIC TI 5 (MISCELLANEOUS) IMPLANT
APR CLP MED LRG 33X5 (MISCELLANEOUS)
APR CLP MED LRG 5 ANG JAW (MISCELLANEOUS) ×1
BENZOIN TINCTURE PRP APPL 2/3 (GAUZE/BANDAGES/DRESSINGS) IMPLANT
CABLE HIGH FREQUENCY MONO STRZ (ELECTRODE) IMPLANT
CLIP APPLIE 5 13 M/L LIGAMAX5 (MISCELLANEOUS) IMPLANT
CLOTH BEACON ORANGE TIMEOUT ST (SAFETY) ×2 IMPLANT
DECANTER SPIKE VIAL GLASS SM (MISCELLANEOUS) ×2 IMPLANT
DERMABOND ADVANCED (GAUZE/BANDAGES/DRESSINGS) ×1
DERMABOND ADVANCED .7 DNX12 (GAUZE/BANDAGES/DRESSINGS) IMPLANT
DISSECT BALLN SPACEMKR + OVL (BALLOONS) ×2
DISSECTOR BALLN SPACEMKR + OVL (BALLOONS) ×1 IMPLANT
DISSECTOR BLUNT TIP ENDO 5MM (MISCELLANEOUS) IMPLANT
DRAPE LAPAROSCOPIC ABDOMINAL (DRAPES) ×2 IMPLANT
DRAPE WARM FLUID 44X44 (DRAPE) IMPLANT
ELECT REM PT RETURN 9FT ADLT (ELECTROSURGICAL) ×2
ELECTRODE REM PT RTRN 9FT ADLT (ELECTROSURGICAL) ×1 IMPLANT
GLOVE EUDERMIC 7 POWDERFREE (GLOVE) ×2 IMPLANT
GOWN STRL NON-REIN LRG LVL3 (GOWN DISPOSABLE) ×2 IMPLANT
GOWN STRL REIN XL XLG (GOWN DISPOSABLE) ×4 IMPLANT
KIT BASIN OR (CUSTOM PROCEDURE TRAY) ×2 IMPLANT
MESH ULTRAPRO 3X6 7.6X15CM (Mesh General) ×1 IMPLANT
NDL INSUFFLATION 14GA 120MM (NEEDLE) IMPLANT
NEEDLE INSUFFLATION 14GA 120MM (NEEDLE) IMPLANT
SCISSORS LAP 5X35 DISP (ENDOMECHANICALS) IMPLANT
SET IRRIG TUBING LAPAROSCOPIC (IRRIGATION / IRRIGATOR) IMPLANT
SOLUTION ANTI FOG 6CC (MISCELLANEOUS) ×2 IMPLANT
STOPCOCK 4 WAY LG BORE MALE ST (IV SETS) ×2 IMPLANT
STOPCOCK K 69 2C6206 (IV SETS) IMPLANT
STRIP CLOSURE SKIN 1/2X4 (GAUZE/BANDAGES/DRESSINGS) IMPLANT
SUT MNCRL AB 4-0 PS2 18 (SUTURE) ×2 IMPLANT
SUT VIC AB 3-0 SH 27 (SUTURE)
SUT VIC AB 3-0 SH 27XBRD (SUTURE) IMPLANT
TACKER 5MM HERNIA 3.5CML NAB (ENDOMECHANICALS) ×3 IMPLANT
TOWEL OR 17X26 10 PK STRL BLUE (TOWEL DISPOSABLE) ×2 IMPLANT
TRAY FOLEY CATH 14FRSI W/METER (CATHETERS) ×2 IMPLANT
TRAY LAP CHOLE (CUSTOM PROCEDURE TRAY) ×2 IMPLANT
TROCAR BLADELESS OPT 5 75 (ENDOMECHANICALS) IMPLANT
TROCAR CANNULA W/PORT DUAL 5MM (MISCELLANEOUS) ×2 IMPLANT
TUBING INSUFFLATION 10FT LAP (TUBING) ×2 IMPLANT

## 2013-06-07 NOTE — Progress Notes (Signed)
Patient states he had a nose bleed 06/05/13 and had to have his nose cauterized. Dr. Dalbert Batman is aware.  (pt called his office)

## 2013-06-07 NOTE — Transfer of Care (Signed)
Immediate Anesthesia Transfer of Care Note  Patient: Larry Mcfarland  Procedure(s) Performed: Procedure(s): LAPAROSCOPIC INGUINAL HERNIA possible open (Right) INSERTION OF MESH (Right)  Patient Location: PACU  Anesthesia Type:General  Level of Consciousness: awake, alert , oriented and patient cooperative  Airway & Oxygen Therapy: Patient Spontanous Breathing and Patient connected to face mask oxygen  Post-op Assessment: Report given to PACU RN, Post -op Vital signs reviewed and stable and Patient moving all extremities  Post vital signs: Reviewed and stable  Complications: No apparent anesthesia complications

## 2013-06-07 NOTE — Preoperative (Signed)
Beta Blockers   Reason not to administer Beta Blockers:Metoprolol taken 0530 06-07-13

## 2013-06-07 NOTE — Interval H&P Note (Signed)
History and Physical Interval Note:  06/07/2013 8:12 AM  Larry Mcfarland  has presented today for surgery, with the diagnosis of right inguinal hernia  The goals and the various methods of treatment have been discussed with the patient and family. After consideration of risks, benefits and other options for treatment, the patient has consented to  Procedure(s): LAPAROSCOPIC INGUINAL HERNIA possible open (Right) INSERTION OF MESH (Right) as a surgical intervention .  The patient's history has been reviewed, patient examined today, no change in status, stable for surgery.  I have reviewed the patient's chart and labs.  Questions were answered to the patient's satisfaction.     Edsel Petrin. Dalbert Batman, M.D., [facility] General and Minimally invasive Surgery Breast and Colorectal Surgery Office:   [phone] Pager:   [phone]

## 2013-06-07 NOTE — Anesthesia Postprocedure Evaluation (Signed)
Anesthesia Post Note  Patient: Larry Mcfarland  Procedure(s) Performed: Procedure(s) (LRB): LAPAROSCOPIC INGUINAL HERNIA possible open (Right) INSERTION OF MESH (Right)  Anesthesia type: General  Patient location: PACU  Post pain: Pain level controlled  Post assessment: Post-op Vital signs reviewed  Last Vitals: BP 126/90  Pulse 51  Temp(Src) 36.8 C (Axillary)  Resp 18  Ht 5\' 7"  (1.702 m)  Wt 139 lb 15.9 oz (63.5 kg)  BMI 21.92 kg/m2  SpO2 96%  Post vital signs: Reviewed  Level of consciousness: sedated  Complications: No apparent anesthesia complications

## 2013-06-07 NOTE — Op Note (Signed)
Patient Name:           ASCHER BERTRAND   Date of Surgery:        06/07/2013  Pre op Diagnosis:      Right inguinal hernia  Post op Diagnosis:    Right inguinal hernia  Procedure:                 Laparoscopic repair right inguinal hernia with mesh(TEPP)  Surgeon:                     Edsel Petrin. Dalbert Batman, M.D., FACS  Assistant:                      None  Operative Indications:   Larry Mcfarland is a 79 y.o. male. I evaluated him a few months ago to discuss his right inguinal hernia and He decided to postpone this indefinitely.  He returned to see me recently because he had an episode of incarceration in May.. He said it felt like a hard golf ball. Wasn't really hurting much but was very alarming he could not push it back in. He went to the emergency department and a physician had to forcibly reduce it. He has been fine since. He has not play golf since.  Recall that he does not have any  major medical problems and seems to be stable and independent. He does have hypertension, mild chronic kidney disease, BPH with nocturia, anxiety, coronary artery disease, and a single episode of atrial fibrillation in the past. He takes aspirin, Flonase, Zestril, beta blockers, simvastatin, Flomax, and Prilosec   Operative Findings:       He had an indirect hernia sac going all the way up to the internal ring which we were able to pull back above the level of the anterior superior iliac spine. He also had a direct defect. We were able to repair everything with a 3" x 6" piece of UltraPro  mesh. Tissues were somewhat fragile and bled easily but there was good hemostasis at the end of the case.  Procedure in Detail:          Following the induction of general endotracheal anesthesia, a Foley catheter was inserted and the bladder was emptied. The Foley balloon was deflated and the catheter taped to the leg. The abdomen and genitalia were prepped and draped in sterile fashion. Intravenous antibiotics were given.  Surgical time out was performed. 0.5% Marcaine with epinephrine was used as a local infiltration anesthetic.   A short transverse incision was made at the lower rim of the umbilicus. The fascia was incised transversely exposing the medial border of the right rectus muscle. The spacemaker balloon was inserted in the right rectus sheath in the midline the videocamera was inserted. The spacemaker balloon was inflated slowly under direct vision and the balloon deployed bilaterally. The balloon was held in place for about 5 minutes. The balloon was then deflated and removed. The trocar balloon was inflated and the trocar was connected to the insufflator at 14 mm of mercury. A video camera was reinserted. We had a little bit of blood in the area just below the symphysis pubis. A 5 mm trocar was placed in the midline below the umbilicus. I then cleaned off the  peritoneum on the left side and was able to put place a 5 mm trocar in the left lower quadrant through the muscle tissues in the extraperitoneal space. We suctioned out a little  bit of blood. We identified the symphysis pubis and Cooper's ligaments bilaterally. We then identified a direct defect and an indirect hernia sac. The indirect hernia sac was  moderately large and  it had to be carefully dissected back  well away from the cord structures. It did look like we had the anatomy completely identified and we could see the vas deferens and testicular vessels. A small lipoma was also reduced. A 3" x 6" piece of UltraPro mesh was inserted and positioned transversely so as to overlap the midline slightly, Cooper's ligament  slightly and then deployed well lateral to the internal ring. The mesh was fixed in place with a 5 mm ProTacker device. I placed a few tacks along the superior rim of Cooper's ligament, up along the midline, across the posterior belly of the right rectus muscle and I placed a few tacks laterally. The lateral tacks were placed above the iliopubic  tract and  I  I was sure to palpate the tacker through the abdominal wall. I then  smoothed the mesh out and it deployed nicely. We had a couple of holes in the peritoneum which were closed with 5 mm clips. This closed nicely and completely The area was irrigated and observed and everything looked fine. The pneumoperitoneum was released and the trocars were removed. The fascia at the umbilicus was closed with 2 figure-of-eight sutures of 0 Vicryl and the skin incisions closed with subcuticular sutures of 4-0 Monocryl and Dermabond. The patient tolerated the procedure well taken to recovery in stable condition. EBL 25 cc. Counts correct. Complications none.     Edsel Petrin. Dalbert Batman, M.D., FACS General and Minimally Invasive Surgery Breast and Colorectal Surgery  06/07/2013 9:43 AM

## 2013-06-07 NOTE — Progress Notes (Signed)
Pt having trouble voiding; foley removed at 0930 in PACU; bladder scanned patient with 625ml residual; notified Dr. Dalbert Batman; orders for indwelling catheter

## 2013-06-07 NOTE — Anesthesia Preprocedure Evaluation (Addendum)
Anesthesia Evaluation  Patient identified by MRN, date of birth, ID band Patient awake    Reviewed: Allergy & Precautions, H&P , NPO status , Patient's Chart, lab work & pertinent test results  Airway Mallampati: II TM Distance: >3 FB Neck ROM: Full    Dental  (+) Dental Advisory Given and Teeth Intact   Pulmonary neg pulmonary ROS,  breath sounds clear to auscultation        Cardiovascular hypertension, Pt. on medications + CAD + dysrhythmias Atrial Fibrillation Rhythm:Regular Rate:Normal     Neuro/Psych PSYCHIATRIC DISORDERS Anxiety Depression negative neurological ROS     GI/Hepatic Neg liver ROS, GERD-  Medicated,  Endo/Other  negative endocrine ROS  Renal/GU CRF and Renal InsufficiencyRenal disease     Musculoskeletal negative musculoskeletal ROS (+)   Abdominal   Peds  Hematology negative hematology ROS (+)   Anesthesia Other Findings   Reproductive/Obstetrics                        Anesthesia Physical Anesthesia Plan  ASA: III  Anesthesia Plan: General   Post-op Pain Management:    Induction: Intravenous  Airway Management Planned: Oral ETT  Additional Equipment:   Intra-op Plan:   Post-operative Plan: Extubation in OR  Informed Consent: I have reviewed the patients History and Physical, chart, labs and discussed the procedure including the risks, benefits and alternatives for the proposed anesthesia with the patient or authorized representative who has indicated his/her understanding and acceptance.   Dental advisory given  Plan Discussed with: CRNA  Anesthesia Plan Comments:         Anesthesia Quick Evaluation

## 2013-06-08 ENCOUNTER — Encounter (HOSPITAL_COMMUNITY): Payer: Self-pay | Admitting: General Surgery

## 2013-06-08 MED ORDER — POLYETHYLENE GLYCOL 3350 17 G PO PACK
17.0000 g | PACK | Freq: Every day | ORAL | Status: DC
  Administered 2013-06-08: 17 g via ORAL
  Filled 2013-06-08 (×2): qty 1

## 2013-06-08 NOTE — Care Management Note (Signed)
    Page 1 of 1   06/08/2013     11:44:52 AM   CARE MANAGEMENT NOTE 06/08/2013  Patient:  Larry Mcfarland, Larry Mcfarland   Account Number:  [id number]  Date Initiated:  06/08/2013  Documentation initiated by:  Sunday Spillers  Subjective/Objective Assessment:   79 yo male admitted s/p inguinal hernia repair, unable to void post op. PTA lived at home alone.     Action/Plan:   Home when stable   Anticipated DC Date:  06/09/2013   Anticipated DC Plan:  HOME/SELF CARE  In-house referral  Clinical Social Worker      DC Planning Services  CM consult      Choice offered to / List presented to:             Status of service:  Completed, signed off Medicare Important Message given?   (If response is "NO", the following Medicare IM given date fields will be blank) Date Medicare IM given:   Date Additional Medicare IM given:    Discharge Disposition:  HOME/SELF CARE  Per UR Regulation:  Reviewed for med. necessity/level of care/duration of stay  If discussed at [facility] of Stay Meetings, dates discussed:    Comments:  06-08-13 [place] [address] Spoke with patient and son at bedside. Both expressed concerns with disposition as the son was returning to [place] in the next few days. Discussed options, patient and son feel d/c to home is best and requested resources for transportation. Contacted CSW to provide.

## 2013-06-08 NOTE — Progress Notes (Signed)
1 Day Post-Op  Subjective: Alert. Vital signs stable. Able to sleep some period pain control seems adequate.  Not unexpectedly, he developed urinary retention with 690 cc residual. Foley catheter inserted and he's doing well now. He is followed by Larry Mcfarland for prostate problems and is on Flomax.  He really hasn't eaten very much. Feels like he has some reflux symptoms but no nausea or vomiting. He is anxious about going home and asked if he can stay 1 more day. That seems reasonable  Objective: Vital signs in last 24 hours: Temp:  [97.5 F (36.4 C)-98.5 F (36.9 C)] 98.2 F (36.8 C) (07/10 0157) Pulse Rate:  [50-71] 50 (07/10 0157) Resp:  [14-18] 14 (07/10 0157) BP: (126-185)/(62-107) 134/69 mmHg (07/10 0157) SpO2:  [94 %-100 %] 99 % (07/10 0157) Weight:  [139 lb 15.9 oz (63.5 kg)] 139 lb 15.9 oz (63.5 kg) (07/09 1130) Last BM Date: 06/06/13  Intake/Output from previous day: 07/09 0701 - 07/10 0700 In: 1540 [P.O.:240; I.V.:1300] Out: 1565 [Urine:1550; Blood:15] Intake/Output this shift: Total I/O In: -  Out: 750 [Urine:750]    Exam: General appearance: alert. Pleasant. Oriented. In no significant distress. Skin warm and dry Resp: clear to auscultation bilaterally GI: abdomen soft. Umbilical incision looks fine. Small ecchymoses below umbilicus. Inguinal areas looked good. No swelling. Minimal ecchymoses. A little tender on the right, nothing unusual. Catheter draining clear urine.     Lab Results:  No results found for this or any previous visit (from the past 24 hour(s)).   Studies/Results: @RISRSLT24 @  . cholecalciferol  1,000 Units Oral q morning - 10a  . heparin subcutaneous  5,000 Units Subcutaneous Q8H  . lisinopril  10 mg Oral BID  . metoprolol tartrate  12.5 mg Oral BID  . pantoprazole  40 mg Oral Daily  . polyethylene glycol  17 g Oral Daily  . simvastatin  20 mg Oral QHS  . tamsulosin  0.4 mg Oral QPC supper     Assessment/Plan: s/p  Procedure(s): LAPAROSCOPIC INGUINAL HERNIA possible open INSERTION OF MESH  POD #1. Laparoscopic repair right inguinal hernia with mesh. No apparent direct surgical problems.  Postop urinary retention secondary to BPH, narcotic, etc.   Not surprising.   Relieved with Foley catheter . We'll plan to leave catheter over the weekend and remove in office next week, either with me or with Larry Mcfarland.  Hypertension Chronic kidney disease Anxiety GERD Coronary disease  Disposition. He will made in the hospital today and plan to discharge home with Foley catheter tomorrow. MiraLAX today. Certainly he will be constipated from inactivity and narcotic Advance diet as tolerated Ambulate in halls Reassess tomorrow morning.  @PROBHOSP @  LOS: 1 day    Larry Mcfarland, M.D., [facility] General and Minimally invasive Surgery Breast and Colorectal Surgery Office:   [phone] Pager:   [phone]  06/08/2013  . .prob

## 2013-06-09 MED ORDER — HYDROCODONE-ACETAMINOPHEN 5-325 MG PO TABS: 1.0000 | ORAL_TABLET | ORAL | Status: DC | PRN

## 2013-06-09 MED ORDER — POLYETHYLENE GLYCOL 3350 17 G PO PACK
17.0000 g | PACK | Freq: Every day | ORAL | Status: DC
  Administered 2013-06-09: 17 g via ORAL
  Filled 2013-06-09: qty 1

## 2013-06-09 NOTE — Progress Notes (Signed)
Pt for d/c home today with foley per MD. Appt. Made to remove foley on Monday at MD's office--pt & son aware. IV D'c'd. Dermabond sites CDI to abdomen. Dr Cy Blamer tel no. Given to son & pt to contact if has urinary issues/trouble after foley d/c'd Mon.  Pt's personal belongings brought with him on d/c. Son at bedside to assist with d/c. Discharge instructions & Rx given with verbalized understanding.

## 2013-06-09 NOTE — Discharge Summary (Signed)
Patient ID: Larry Mcfarland [id number] 79 y.o. 20-Aug-1921  Admit date: 06/07/2013  Discharge date and time: 06/09/2013  Admitting Physician: Adin Hector  Discharge Physician: Adin Hector  Admission Diagnoses: right inguinal hernia  Discharge Diagnoses: ` Right inguinal hernia                                    Postop urinary retention and BPH  Hypertension  Chronic kidney disease  Anxiety  GERD  Coronary disease  Operations: Procedure(s): LAPAROSCOPIC INGUINAL HERNIA REPAIR INSERTION OF MESH  Admission Condition: good  Discharged Condition: good  Indication for Admission: Larry Mcfarland is a 79 y.o. male. I evaluated him a few months ago to discuss his right inguinal hernia and He decided to postpone this indefinitely.  He returned to see me recently because he had an episode of incarceration in May.. He said it felt like a hard golf ball. Wasn't really hurting much but was very alarming he could not push it back in. He went to the emergency department and a physician had to forcibly reduce it. He has been fine since. He has not played golf since.  Recall that he does not have any major medical problems and seems to be stable and independent. He does have hypertension, mild chronic kidney disease, BPH with nocturia, anxiety, coronary artery disease, and a single episode of atrial fibrillation in the past. He takes aspirin, Flonase, Zestril, beta blockers, simvastatin, Flomax, and Prilosec   Hospital Course: On the day of admission the patient was taken to the operating room and underwent a laparoscopic repair of his right inguinal hernia with mesh. Surgery was uneventful. He remained stable postop. He was unable to void and required Foley catheter insertion, not unexpectedly. He advanced his diet and activities fairly uneventfully. At the time of discharge he had a little bit of ecchymoses around the penis and the umbilicus but all the wounds were healing well.  There was no fluid collections or hematomas. Hernia repair was intact.He was ready for discharge on postop day 2. I discussed catheter care with the patient and with Dr. Risa Grill. We elected to leave the catheter in over the weekend and he will return to my office Monday morning to remove the catheter. If he has any trouble voiding he knows to contact Dr. Cy Blamer office for further management. He was given a prescription for Vicodin for pain. Diet and activities were discussed. He will return to see me for a routine visit in 2 weeks.  Consults: None  Significant Diagnostic Studies: none  Treatments: surgery: Laparoscopic repair of right inguinal hernia with mesh  Disposition: Home  Patient Instructions:    Medication List    ASK your doctor about these medications       ALPRAZolam 0.25 MG tablet  Commonly known as:  XANAX  Take 0.0625 mg by mouth daily as needed for sleep or anxiety. Take 1/4 of a tablet as needed     aspirin EC 81 MG tablet  Take 81 mg by mouth every morning.     cholecalciferol 1000 UNITS tablet  Commonly known as:  VITAMIN D  Take 1,000 Units by mouth every morning.     fluticasone 50 MCG/ACT nasal spray  Commonly known as:  FLONASE  Place 2 sprays into the nose daily. As needed     Glucosamine 500 MG Caps  Take 500 mg by mouth daily.  lisinopril 20 MG tablet  Commonly known as:  PRINIVIL,ZESTRIL  Take 10 mg by mouth 2 (two) times daily.     metoprolol tartrate 25 MG tablet  Commonly known as:  LOPRESSOR  Take 12.5 mg by mouth 2 (two) times daily.     MULTIVITAMIN PO  Take 0.5-1 tablets by mouth daily.     omeprazole 20 MG capsule  Commonly known as:  PRILOSEC  Take 40 mg by mouth daily before breakfast.     simvastatin 40 MG tablet  Commonly known as:  ZOCOR  Take 20 mg by mouth at bedtime.     tamsulosin 0.4 MG Caps  Commonly known as:  FLOMAX  Take 0.4 mg by mouth daily after supper.        Activity: activity as tolerated. No heavy  lifting for 6 weeks. No sports for 6 weeks. No driving for one week. He may shower. Diet: regular diet Wound Care: none needed  Follow-up:  With Dr. [name] in 2 weeks.  Signed: Edsel Petrin. [name], M.D., FACS General and minimally invasive surgery Breast and Colorectal Surgery  06/09/2013, 6:42 AM

## 2013-06-12 ENCOUNTER — Ambulatory Visit (INDEPENDENT_AMBULATORY_CARE_PROVIDER_SITE_OTHER): Payer: Medicare Other

## 2013-06-12 DIAGNOSIS — Z466 Encounter for fitting and adjustment of urinary device: Secondary | ICD-10-CM

## 2013-06-12 NOTE — Progress Notes (Signed)
Pt comes in today for removal of foley catheter removal.  After getting pt situated on the table, Kendall removed the tape holding the catheter to the pt's leg with adhesive tape remover.  I then removed 10 cc of saline from the catheter balloon and removed the catheter.  Urine output was approx 100 cc, yellow in color with sediments in the catheter tubing.  Pt was unable to produce urine immediately after removal of catheter and I instructed him that if he is still unable to produce urine after 24 hours to call our office.  Pt scheduled to come back and see Dr. Dalbert Batman on July 22.

## 2013-06-20 ENCOUNTER — Encounter (INDEPENDENT_AMBULATORY_CARE_PROVIDER_SITE_OTHER): Payer: Self-pay | Admitting: General Surgery

## 2013-06-20 ENCOUNTER — Ambulatory Visit (INDEPENDENT_AMBULATORY_CARE_PROVIDER_SITE_OTHER): Payer: Medicare Other | Admitting: General Surgery

## 2013-06-20 VITALS — BP 116/68 | HR 74 | Temp 98.6°F | Resp 15 | Ht 68.0 in | Wt 137.8 lb

## 2013-06-20 DIAGNOSIS — K4090 Unilateral inguinal hernia, without obstruction or gangrene, not specified as recurrent: Secondary | ICD-10-CM

## 2013-06-20 NOTE — Patient Instructions (Signed)
You have recovered from your laparoscopic right inguinal hernia repair without any obvious surgical complications.  You may resume normal activities without restriction after August 20.  For constipation, I advise high fiber diet, and drink lots of water. You may take MiraLAX daily if you need to.  Call Dr. Risa Grill If you have  any trouble urinating  Return to see Dr. Dalbert Batman if further problems arise.

## 2013-06-20 NOTE — Progress Notes (Signed)
Patient ID: Larry Mcfarland, male   DOB: 1921-11-23, 79 y.o.   MRN: [id number] History: This patient underwent laparoscopic repair of right inguinal hernia with mesh on 06/07/2013. He had troubles with urinary retention and had a Foley catheter for a few days. That has been removed and he is urinating reasonably. He's been on Flomax for some time and sees Dr. Risa Grill from time to time. He has no complaints about his wounds. He is a little bit constipated  Exam: Patient looks well.  Abdomen soft. Umbilical incision healing well. Right groin feels normal. He is a tiny seroma, 1 cm or less. Nontender. Hernia repair intact. Penis scrotum and testes normal  Assessment: Right inguinal hernia, uneventful recovery following laparoscopic repair with mesh BPH Postoperative irritation, resolved, on Flomax  Plan: Resume normal activities after August 20 High-fiber low-fat diet with hydration  MiraLAX as needed Follow up with his urologist as necessary Return to see me if further problems arise   Edsel Petrin. Dalbert Batman, M.D., [facility] General and Minimally invasive Surgery Breast and Colorectal Surgery Office:   [phone] Pager:   [phone]

## 2013-07-11 DIAGNOSIS — N183 Chronic kidney disease, stage 3 unspecified: Secondary | ICD-10-CM | POA: Diagnosis not present

## 2013-07-18 DIAGNOSIS — I129 Hypertensive chronic kidney disease with stage 1 through stage 4 chronic kidney disease, or unspecified chronic kidney disease: Secondary | ICD-10-CM | POA: Diagnosis not present

## 2013-07-18 DIAGNOSIS — R7301 Impaired fasting glucose: Secondary | ICD-10-CM | POA: Diagnosis not present

## 2013-07-18 DIAGNOSIS — N183 Chronic kidney disease, stage 3 unspecified: Secondary | ICD-10-CM | POA: Diagnosis not present

## 2013-09-11 DIAGNOSIS — J069 Acute upper respiratory infection, unspecified: Secondary | ICD-10-CM | POA: Diagnosis not present

## 2013-09-25 DIAGNOSIS — Z23 Encounter for immunization: Secondary | ICD-10-CM | POA: Diagnosis not present

## 2013-09-26 DIAGNOSIS — D235 Other benign neoplasm of skin of trunk: Secondary | ICD-10-CM | POA: Diagnosis not present

## 2013-09-26 DIAGNOSIS — L570 Actinic keratosis: Secondary | ICD-10-CM | POA: Diagnosis not present

## 2013-10-24 DIAGNOSIS — I129 Hypertensive chronic kidney disease with stage 1 through stage 4 chronic kidney disease, or unspecified chronic kidney disease: Secondary | ICD-10-CM | POA: Diagnosis not present

## 2013-11-01 DIAGNOSIS — I10 Essential (primary) hypertension: Secondary | ICD-10-CM | POA: Diagnosis not present

## 2013-11-01 DIAGNOSIS — R3915 Urgency of urination: Secondary | ICD-10-CM | POA: Diagnosis not present

## 2013-11-01 DIAGNOSIS — K219 Gastro-esophageal reflux disease without esophagitis: Secondary | ICD-10-CM | POA: Diagnosis not present

## 2013-11-01 DIAGNOSIS — N183 Chronic kidney disease, stage 3 unspecified: Secondary | ICD-10-CM | POA: Diagnosis not present

## 2013-11-21 DIAGNOSIS — I10 Essential (primary) hypertension: Secondary | ICD-10-CM | POA: Diagnosis not present

## 2013-11-29 DIAGNOSIS — N183 Chronic kidney disease, stage 3 unspecified: Secondary | ICD-10-CM | POA: Diagnosis not present

## 2013-11-29 DIAGNOSIS — I10 Essential (primary) hypertension: Secondary | ICD-10-CM | POA: Diagnosis not present

## 2013-12-14 DIAGNOSIS — N183 Chronic kidney disease, stage 3 unspecified: Secondary | ICD-10-CM | POA: Diagnosis not present

## 2013-12-14 DIAGNOSIS — I129 Hypertensive chronic kidney disease with stage 1 through stage 4 chronic kidney disease, or unspecified chronic kidney disease: Secondary | ICD-10-CM | POA: Diagnosis not present

## 2014-01-30 DIAGNOSIS — I10 Essential (primary) hypertension: Secondary | ICD-10-CM | POA: Diagnosis not present

## 2014-02-06 DIAGNOSIS — I10 Essential (primary) hypertension: Secondary | ICD-10-CM | POA: Diagnosis not present

## 2014-02-06 DIAGNOSIS — N401 Enlarged prostate with lower urinary tract symptoms: Secondary | ICD-10-CM | POA: Diagnosis not present

## 2014-02-06 DIAGNOSIS — R7301 Impaired fasting glucose: Secondary | ICD-10-CM | POA: Diagnosis not present

## 2014-02-06 DIAGNOSIS — G479 Sleep disorder, unspecified: Secondary | ICD-10-CM | POA: Diagnosis not present

## 2014-02-06 DIAGNOSIS — R351 Nocturia: Secondary | ICD-10-CM | POA: Diagnosis not present

## 2014-02-06 DIAGNOSIS — M542 Cervicalgia: Secondary | ICD-10-CM | POA: Diagnosis not present

## 2014-02-06 DIAGNOSIS — N183 Chronic kidney disease, stage 3 unspecified: Secondary | ICD-10-CM | POA: Diagnosis not present

## 2014-02-06 DIAGNOSIS — N138 Other obstructive and reflux uropathy: Secondary | ICD-10-CM | POA: Diagnosis not present

## 2014-03-24 ENCOUNTER — Encounter (HOSPITAL_COMMUNITY): Payer: Self-pay | Admitting: Emergency Medicine

## 2014-03-24 ENCOUNTER — Emergency Department (HOSPITAL_COMMUNITY)
Admission: EM | Admit: 2014-03-24 | Discharge: 2014-03-24 | Disposition: A | Discharge status: Home / Self Care | Payer: Medicare Other | Attending: Emergency Medicine | Admitting: Emergency Medicine

## 2014-03-24 ENCOUNTER — Emergency Department (HOSPITAL_COMMUNITY): Payer: Medicare Other

## 2014-03-24 DIAGNOSIS — I499 Cardiac arrhythmia, unspecified: Secondary | ICD-10-CM | POA: Diagnosis not present

## 2014-03-24 DIAGNOSIS — K2970 Gastritis, unspecified, without bleeding: Secondary | ICD-10-CM | POA: Diagnosis not present

## 2014-03-24 DIAGNOSIS — R7301 Impaired fasting glucose: Secondary | ICD-10-CM | POA: Diagnosis not present

## 2014-03-24 DIAGNOSIS — K219 Gastro-esophageal reflux disease without esophagitis: Secondary | ICD-10-CM | POA: Insufficient documentation

## 2014-03-24 DIAGNOSIS — H539 Unspecified visual disturbance: Secondary | ICD-10-CM | POA: Insufficient documentation

## 2014-03-24 DIAGNOSIS — N183 Chronic kidney disease, stage 3 unspecified: Secondary | ICD-10-CM | POA: Diagnosis not present

## 2014-03-24 DIAGNOSIS — R040 Epistaxis: Secondary | ICD-10-CM | POA: Diagnosis not present

## 2014-03-24 DIAGNOSIS — R112 Nausea with vomiting, unspecified: Secondary | ICD-10-CM | POA: Diagnosis not present

## 2014-03-24 DIAGNOSIS — K4090 Unilateral inguinal hernia, without obstruction or gangrene, not specified as recurrent: Secondary | ICD-10-CM | POA: Insufficient documentation

## 2014-03-24 DIAGNOSIS — R42 Dizziness and giddiness: Secondary | ICD-10-CM | POA: Diagnosis not present

## 2014-03-24 DIAGNOSIS — M899 Disorder of bone, unspecified: Secondary | ICD-10-CM | POA: Insufficient documentation

## 2014-03-24 DIAGNOSIS — E785 Hyperlipidemia, unspecified: Secondary | ICD-10-CM | POA: Diagnosis not present

## 2014-03-24 DIAGNOSIS — IMO0002 Reserved for concepts with insufficient information to code with codable children: Secondary | ICD-10-CM | POA: Diagnosis not present

## 2014-03-24 DIAGNOSIS — C44300 Unspecified malignant neoplasm of skin of unspecified part of face: Secondary | ICD-10-CM | POA: Insufficient documentation

## 2014-03-24 DIAGNOSIS — M109 Gout, unspecified: Secondary | ICD-10-CM | POA: Diagnosis not present

## 2014-03-24 DIAGNOSIS — K449 Diaphragmatic hernia without obstruction or gangrene: Secondary | ICD-10-CM | POA: Diagnosis not present

## 2014-03-24 DIAGNOSIS — M1990 Unspecified osteoarthritis, unspecified site: Secondary | ICD-10-CM | POA: Insufficient documentation

## 2014-03-24 DIAGNOSIS — I10 Essential (primary) hypertension: Secondary | ICD-10-CM | POA: Insufficient documentation

## 2014-03-24 DIAGNOSIS — F329 Major depressive disorder, single episode, unspecified: Secondary | ICD-10-CM | POA: Diagnosis not present

## 2014-03-24 DIAGNOSIS — Z79899 Other long term (current) drug therapy: Secondary | ICD-10-CM | POA: Insufficient documentation

## 2014-03-24 DIAGNOSIS — N400 Enlarged prostate without lower urinary tract symptoms: Secondary | ICD-10-CM | POA: Diagnosis not present

## 2014-03-24 DIAGNOSIS — Z880 Allergy status to penicillin: Secondary | ICD-10-CM | POA: Diagnosis not present

## 2014-03-24 DIAGNOSIS — M949 Disorder of cartilage, unspecified: Secondary | ICD-10-CM | POA: Diagnosis not present

## 2014-03-24 DIAGNOSIS — F411 Generalized anxiety disorder: Secondary | ICD-10-CM | POA: Diagnosis not present

## 2014-03-24 DIAGNOSIS — I2510 Atherosclerotic heart disease of native coronary artery without angina pectoris: Secondary | ICD-10-CM | POA: Diagnosis not present

## 2014-03-24 DIAGNOSIS — Z7982 Long term (current) use of aspirin: Secondary | ICD-10-CM | POA: Insufficient documentation

## 2014-03-24 DIAGNOSIS — H269 Unspecified cataract: Secondary | ICD-10-CM | POA: Insufficient documentation

## 2014-03-24 DIAGNOSIS — K2990 Gastroduodenitis, unspecified, without bleeding: Secondary | ICD-10-CM | POA: Diagnosis not present

## 2014-03-24 DIAGNOSIS — R1013 Epigastric pain: Secondary | ICD-10-CM | POA: Diagnosis not present

## 2014-03-24 LAB — COMPREHENSIVE METABOLIC PANEL
ALT: 17 U/L (ref 0–53)
AST: 25 U/L (ref 0–37)
Albumin: 3.8 g/dL (ref 3.5–5.2)
Alkaline Phosphatase: 81 U/L (ref 39–117)
BUN: 31 mg/dL — ABNORMAL HIGH (ref 6–23)
CO2: 21 mEq/L (ref 19–32)
Calcium: 9.4 mg/dL (ref 8.4–10.5)
Chloride: 106 mEq/L (ref 96–112)
Creatinine, Ser: 1.81 mg/dL — ABNORMAL HIGH (ref 0.50–1.35)
GFR calc Af Amer: 36 mL/min — ABNORMAL LOW (ref >90)
GFR calc non Af Amer: 31 mL/min — ABNORMAL LOW (ref >90)
Glucose, Bld: 182 mg/dL — ABNORMAL HIGH (ref 70–99)
Potassium: 4.5 mEq/L (ref 3.7–5.3)
Sodium: 140 mEq/L (ref 137–147)
Total Bilirubin: 0.3 mg/dL (ref 0.3–1.2)
Total Protein: 6.8 g/dL (ref 6.0–8.3)

## 2014-03-24 LAB — CBC WITH DIFFERENTIAL/PLATELET
Basophils Absolute: 0 10*3/uL (ref 0.0–0.1)
Basophils Relative: 0 % (ref 0–1)
Eosinophils Absolute: 0 10*3/uL (ref 0.0–0.7)
Eosinophils Relative: 0 % (ref 0–5)
HCT: 37.8 % — ABNORMAL LOW (ref 39.0–52.0)
Hemoglobin: 12.5 g/dL — ABNORMAL LOW (ref 13.0–17.0)
Lymphocytes Relative: 7 % — ABNORMAL LOW (ref 12–46)
Lymphs Abs: 0.8 10*3/uL (ref 0.7–4.0)
MCH: 32.6 pg (ref 26.0–34.0)
MCHC: 33.1 g/dL (ref 30.0–36.0)
MCV: 98.4 fL (ref 78.0–100.0)
Monocytes Absolute: 0.6 10*3/uL (ref 0.1–1.0)
Monocytes Relative: 6 % (ref 3–12)
Neutro Abs: 9.2 10*3/uL — ABNORMAL HIGH (ref 1.7–7.7)
Neutrophils Relative %: 87 % — ABNORMAL HIGH (ref 43–77)
Platelets: 161 10*3/uL (ref 150–400)
RBC: 3.84 MIL/uL — ABNORMAL LOW (ref 4.22–5.81)
RDW: 13.5 % (ref 11.5–15.5)
WBC: 10.6 10*3/uL — ABNORMAL HIGH (ref 4.0–10.5)

## 2014-03-24 LAB — LIPASE, BLOOD: Lipase: 42 U/L (ref 11–59)

## 2014-03-24 LAB — TROPONIN I: Troponin I: <0.3 ng/mL (ref <0.30)

## 2014-03-24 NOTE — ED Notes (Addendum)
Pt presents via [facility] from home with c/o sudden onset Epigastric pain that began while eating at [employer] this evening at 1820. Pt reports feeling a severe pain and "like something was stuck". Pt went home, had an episode of nausea and emesis x1 which relieved the pain. EMS reports being able to "palpate a knot at the epigastric area" pt reports area remains tender. Pt is alert, interactive and in NAD.

## 2014-03-24 NOTE — ED Provider Notes (Signed)
CSN: [id number]     Arrival date & time 03/24/14  2013 History   First MD Initiated Contact with Patient 03/24/14 2048     Chief Complaint  Patient presents with  . Abdominal Pain     (Consider location/radiation/quality/duration/timing/severity/associated sxs/prior Treatment) Patient is a 80 y.o. male presenting with abdominal pain.  Abdominal Pain  Pt presents to the ED via EMS from home for evaluation of epigastric pain. He was eating chicken pot pie at [employer] this evening when he had sudden onset of severe, sharp, nonradiating epigastric pain. Felt like he was going to vomit but did not throw up at that time. His pain subsided some and so he drove himself home where he drank a small amount of Coke. He then went to the bathroom where he vomited small amount of mucous, but no food (and curiously no Coke). He has felt better since calling EMS. Essentially asymptomatic now. Denies history of similar.   Past Medical History  Diagnosis Date  . Hypertension   . Hyperlipemia   . Inguinal hernia without mention of obstruction or gangrene, unilateral or unspecified, (not specified as recurrent)   . CKD (chronic kidney disease), stage III   . Anxiety   . Osteopenia 2002  . BPH (benign prostatic hyperplasia)   . Osteoarthritis   . Gout   . CAD (coronary artery disease)   . GERD (gastroesophageal reflux disease)   . Vision disturbance   . Lumbar radiculopathy   . Vertigo 1999    negative ct scan head; rare recurrance, last in 2013  . Bleeding from the nose 2009    cautery by Dr. Erik Obey  . Cataract     right eye  . Impaired fasting glucose   . Skin cancer of forehead     left (BCE)  . Distal radius fracture 2013    Dr. Fredna Dow  . Major depressive disorder, single episode, unspecified   . Nosebleed     most recent 05/24/13  to have  cauterized prior to surgery   . Dysrhythmia     hx of atrial fib- 10 years ago    Past Surgical History  Procedure Laterality Date  . Tonsillectomy     at age 33 yrs old  . Eye surgery      right eye cataract surgery   . Inguinal hernia repair Right 06/07/2013    Procedure: LAPAROSCOPIC INGUINAL HERNIA REPAIR;  Surgeon: Adin Hector, MD;  Location: [facility];  Service: General;  Laterality: Right;  With Mesh  . Insertion of mesh Right 06/07/2013    Procedure: INSERTION OF MESH;  Surgeon: Adin Hector, MD;  Location: [facility];  Service: General;  Laterality: Right;   Family History  Problem Relation Age of Onset  . Heart disease Mother   . Heart disease Father   . Cancer Sister     breast    History  Substance Use Topics  . Smoking status: Never Smoker   . Smokeless tobacco: Never Used  . Alcohol Use: Yes     Comment: occasional    Review of Systems  Gastrointestinal: Positive for abdominal pain.   All other systems reviewed and are negative except as noted in HPI.     Allergies  Penicillins  Home Medications   Prior to Admission medications   Medication Sig Start Date End Date Taking? Authorizing Provider  ALPRAZolam (XANAX) 0.25 MG tablet Take 0.0625 mg by mouth daily as needed for sleep or anxiety. Take 1/4 of a  tablet as needed    Historical Provider, MD  aspirin EC 81 MG tablet Take 81 mg by mouth every morning.    Historical Provider, MD  cholecalciferol (VITAMIN D) 1000 UNITS tablet Take 1,000 Units by mouth every morning.     Historical Provider, MD  fluticasone (FLONASE) 50 MCG/ACT nasal spray Place 2 sprays into the nose daily. As needed    Historical Provider, MD  Glucosamine 500 MG CAPS Take 500 mg by mouth daily.     Historical Provider, MD  HYDROcodone-acetaminophen (NORCO/VICODIN) 5-325 MG per tablet Take 1-2 tablets by mouth every 4 (four) hours as needed for pain. 06/09/13   Adin Hector, MD  lisinopril (PRINIVIL,ZESTRIL) 20 MG tablet Take 10 mg by mouth 2 (two) times daily.     Historical Provider, MD  metoprolol tartrate (LOPRESSOR) 25 MG tablet Take 12.5 mg by mouth 2 (two) times daily.    Historical  Provider, MD  Multiple Vitamins-Minerals (MULTIVITAMIN PO) Take 0.5-1 tablets by mouth daily.     Historical Provider, MD  omeprazole (PRILOSEC) 20 MG capsule Take 40 mg by mouth daily before breakfast.     Historical Provider, MD  simvastatin (ZOCOR) 40 MG tablet Take 20 mg by mouth at bedtime.    Historical Provider, MD  tamsulosin (FLOMAX) 0.4 MG CAPS Take 0.4 mg by mouth daily after supper.    Historical Provider, MD   BP 126/80  Pulse 66  Temp(Src) 98 F (36.7 C) (Oral)  Resp 18  SpO2 100% Physical Exam  Nursing note and vitals reviewed. Constitutional: He is oriented to person, place, and time. He appears well-developed and well-nourished.  HENT:  Head: Normocephalic and atraumatic.  Eyes: EOM are normal. Pupils are equal, round, and reactive to light.  Neck: Normal range of motion. Neck supple.  Cardiovascular: Normal rate, normal heart sounds and intact distal pulses.   Pulmonary/Chest: Effort normal and breath sounds normal. He has no wheezes. He has no rales.  Abdominal: Bowel sounds are normal. He exhibits no distension. There is no tenderness. There is no rebound and no guarding.  Musculoskeletal: Normal range of motion. He exhibits no edema and no tenderness.  Neurological: He is alert and oriented to person, place, and time. He has normal strength. No cranial nerve deficit or sensory deficit.  Skin: Skin is warm and dry. No rash noted.  Psychiatric: He has a normal mood and affect.    ED Course  Procedures (including critical care time) Labs Review Labs Reviewed  CBC WITH DIFFERENTIAL - Abnormal; Notable for the following:    WBC 10.6 (*)    RBC 3.84 (*)    Hemoglobin 12.5 (*)    HCT 37.8 (*)    Neutrophils Relative % 87 (*)    Neutro Abs 9.2 (*)    Lymphocytes Relative 7 (*)    All other components within normal limits  COMPREHENSIVE METABOLIC PANEL - Abnormal; Notable for the following:    Glucose, Bld 182 (*)    BUN 31 (*)    Creatinine, Ser 1.81 (*)     GFR calc non Af Amer 31 (*)    GFR calc Af Amer 36 (*)    All other components within normal limits  LIPASE, BLOOD  TROPONIN I    Imaging Review Dg Abd Acute W/chest  03/24/2014   CLINICAL DATA:  Epigastric abdominal pain and nausea.  EXAM: ACUTE ABDOMEN SERIES (ABDOMEN 2 VIEW & CHEST 1 VIEW)  COMPARISON:  Chest radiograph performed 06/05/2013  FINDINGS:  The lungs are well-aerated and clear. There is no evidence of focal opacification, pleural effusion or pneumothorax. The cardiomediastinal silhouette is within normal limits. Bilateral nipple shadows are noted. A few calcified granulomata are seen within the lungs. A very large hiatal hernia is seen, filled with fluid and air.  The visualized bowel gas pattern is unremarkable. Scattered stool and air are seen within the colon; there is no evidence of small bowel dilatation to suggest obstruction. No free intra-abdominal air is identified on the provided upright view.  No acute osseous abnormalities are seen; the sacroiliac joints are unremarkable in appearance. Right convex thoracolumbar scoliosis is noted. A mesh is seen in the pelvis, and clips are noted overlying the right pelvic sidewall.  IMPRESSION: 1. Very large hiatal hernia noted, filled with fluid and air. 2. Unremarkable bowel gas pattern; no free intra-abdominal air seen. 3. No acute cardiopulmonary process seen. 4. Right convex thoracolumbar scoliosis noted.   Electronically Signed   By: Garald Balding M.D.   On: 03/24/2014 22:45     EKG Interpretation   Date/Time:  Saturday March 24 2014 20:29:20 EDT Ventricular Rate:  71 PR Interval:  154 QRS Duration: 71 QT Interval:  396 QTC Calculation: 430 R Axis:   69 Text Interpretation:  Sinus rhythm Anteroseptal infarct, old No  significant change since last tracing Confirmed by [facility]  MD, Juanda Crumble  [phone]) on 03/24/2014 8:53:39 PM      MDM   Final diagnoses:  Hiatal hernia    Results reviewed, patient remains asymptomatic,  symptoms likely related to large hiatal hernia on AAS. Pt feels fine and ready to go home.     Charles B. Karle Starch, MD 03/24/14 [phone]

## 2014-03-24 NOTE — Discharge Instructions (Signed)
Hiatal Hernia A hiatal hernia occurs when a part of the stomach slides above the diaphragm. The diaphragm is the thin muscle separating the belly (abdomen) from the chest. A hiatal hernia can be something you are born with or develop over time. Hiatal hernias may allow stomach acid to flow back into your esophagus, the tube which carries food from your mouth to your stomach. If this acid causes problems it is called GERD (gastro-esophageal reflux disease).  SYMPTOMS  Common symptoms of GERD are heartburn (burning in your chest). This is worse when lying down or bending over. It may also cause belching and indigestion. Some of the things which make GERD worse are:  Increased weight pushes on stomach making acid rise more easily.  Smoking markedly increases acid production.  Alcohol decreases lower esophageal sphincter pressure (valve between stomach and esophagus), allowing acid from stomach into esophagus.  Late evening meals and going to bed with a full stomach increases pressure.  Anything that causes an increase in acid production.  Lower esophageal sphincter incompetence. DIAGNOSIS  Hiatal hernia is often diagnosed with x-rays of your stomach and small bowel. This is called an UGI (upper gastrointestinal x-ray). Sometimes a gastroscopic procedure is done. This is a procedure where your caregiver uses a flexible instrument to look into the stomach and small bowel. HOME CARE INSTRUCTIONS   Try to achieve and maintain an ideal body weight.  Avoid drinking alcoholic beverages.  Stop smoking.  Put the head of your bed on 4 to 6 inch blocks. This will keep your head and esophagus higher than your stomach. If you cannot use blocks, sleep with several pillows under your head and shoulders.  Over-the-counter medications will decrease acid production. Your caregiver can also prescribe medications for this. Take as directed.  1/2 to 1 teaspoon of an antacid taken every hour while awake, with  meals and at bedtime, will neutralize acid.  Do not take aspirin, ibuprofen (Advil or Motrin), or other nonsteroidal anti-inflammatory drugs.  Do not wear tight clothing around your chest or stomach.  Eat smaller meals and eat more frequently. This keeps your stomach from getting too full. Eat slowly.  Do not lie down for 2 or 3 hours after eating. Do not eat or drink anything 1 to 2 hours before going to bed.  Avoid caffeine beverages (colas, coffee, cocoa, tea), fatty foods, citrus fruits and all other foods and drinks that contain acid and that seem to increase the problems.  Avoid bending over, especially after eating. Also avoid straining during bowel movements or when urinating or lifting things. Anything that increases the pressure in your belly increases the amount of acid that may be pushed up into your esophagus. SEEK IMMEDIATE MEDICAL CARE IF:  There is change in location (pain in arms, neck, jaw, teeth or back) of your pain, or the pain is getting worse.  You also experience nausea, vomiting, sweating (diaphoresis), or shortness of breath.  You develop continual vomiting, vomit blood or coffee ground material, have bright red blood in your stools, or have black tarry stools. Some of these symptoms could signal other problems such as heart disease. MAKE SURE YOU:   Understand these instructions.  Monitor your condition.  Contact your caregiver if you are not doing well or are getting worse. Document Released: 02/06/2004 Document Revised: 02/08/2012 Document Reviewed: 11/16/2005 [employer] Patient Information 2014 [employer].  

## 2014-03-26 ENCOUNTER — Other Ambulatory Visit: Payer: Self-pay | Admitting: Internal Medicine

## 2014-03-26 DIAGNOSIS — R1013 Epigastric pain: Secondary | ICD-10-CM | POA: Diagnosis not present

## 2014-03-27 ENCOUNTER — Other Ambulatory Visit: Payer: Self-pay | Admitting: Internal Medicine

## 2014-03-27 ENCOUNTER — Ambulatory Visit
Admission: RE | Admit: 2014-03-27 | Discharge: 2014-03-27 | Disposition: A | Discharge status: Home / Self Care | Payer: Medicare Other | Source: Ambulatory Visit | Attending: Internal Medicine | Admitting: Internal Medicine

## 2014-03-27 DIAGNOSIS — R1013 Epigastric pain: Secondary | ICD-10-CM

## 2014-03-27 DIAGNOSIS — K449 Diaphragmatic hernia without obstruction or gangrene: Secondary | ICD-10-CM | POA: Diagnosis not present

## 2014-03-27 DIAGNOSIS — K228 Other specified diseases of esophagus: Secondary | ICD-10-CM | POA: Diagnosis not present

## 2014-03-27 DIAGNOSIS — K2289 Other specified disease of esophagus: Secondary | ICD-10-CM | POA: Diagnosis not present

## 2014-04-25 DIAGNOSIS — I10 Essential (primary) hypertension: Secondary | ICD-10-CM | POA: Diagnosis not present

## 2014-05-02 ENCOUNTER — Other Ambulatory Visit: Payer: Self-pay | Admitting: Internal Medicine

## 2014-05-02 DIAGNOSIS — R1013 Epigastric pain: Secondary | ICD-10-CM

## 2014-05-02 DIAGNOSIS — I10 Essential (primary) hypertension: Secondary | ICD-10-CM | POA: Diagnosis not present

## 2014-05-02 DIAGNOSIS — N183 Chronic kidney disease, stage 3 unspecified: Secondary | ICD-10-CM | POA: Diagnosis not present

## 2014-05-02 DIAGNOSIS — R109 Unspecified abdominal pain: Secondary | ICD-10-CM | POA: Diagnosis not present

## 2014-05-08 ENCOUNTER — Ambulatory Visit
Admission: RE | Admit: 2014-05-08 | Discharge: 2014-05-08 | Disposition: A | Discharge status: Home / Self Care | Payer: Medicare Other | Source: Ambulatory Visit | Attending: Internal Medicine | Admitting: Internal Medicine

## 2014-05-08 DIAGNOSIS — R1013 Epigastric pain: Secondary | ICD-10-CM

## 2014-05-08 DIAGNOSIS — N2889 Other specified disorders of kidney and ureter: Secondary | ICD-10-CM | POA: Diagnosis not present

## 2014-06-04 ENCOUNTER — Other Ambulatory Visit: Payer: Self-pay | Admitting: Gastroenterology

## 2014-06-04 DIAGNOSIS — R1013 Epigastric pain: Secondary | ICD-10-CM | POA: Diagnosis not present

## 2014-06-04 DIAGNOSIS — K449 Diaphragmatic hernia without obstruction or gangrene: Secondary | ICD-10-CM | POA: Diagnosis not present

## 2014-07-10 ENCOUNTER — Encounter (HOSPITAL_COMMUNITY)
Admission: RE | Disposition: A | Discharge status: Home / Self Care | Payer: Self-pay | Source: Ambulatory Visit | Attending: Gastroenterology

## 2014-07-10 ENCOUNTER — Ambulatory Visit (HOSPITAL_COMMUNITY)
Admission: RE | Admit: 2014-07-10 | Discharge: 2014-07-10 | Disposition: A | Discharge status: Home / Self Care | Payer: Medicare Other | Source: Ambulatory Visit | Attending: Gastroenterology | Admitting: Gastroenterology

## 2014-07-10 ENCOUNTER — Encounter (HOSPITAL_COMMUNITY): Payer: Self-pay | Admitting: *Deleted

## 2014-07-10 DIAGNOSIS — M1990 Unspecified osteoarthritis, unspecified site: Secondary | ICD-10-CM | POA: Diagnosis not present

## 2014-07-10 DIAGNOSIS — R1013 Epigastric pain: Secondary | ICD-10-CM | POA: Diagnosis not present

## 2014-07-10 DIAGNOSIS — N138 Other obstructive and reflux uropathy: Secondary | ICD-10-CM | POA: Insufficient documentation

## 2014-07-10 DIAGNOSIS — N183 Chronic kidney disease, stage 3 unspecified: Secondary | ICD-10-CM | POA: Diagnosis not present

## 2014-07-10 DIAGNOSIS — N401 Enlarged prostate with lower urinary tract symptoms: Secondary | ICD-10-CM | POA: Insufficient documentation

## 2014-07-10 DIAGNOSIS — M109 Gout, unspecified: Secondary | ICD-10-CM | POA: Insufficient documentation

## 2014-07-10 DIAGNOSIS — K219 Gastro-esophageal reflux disease without esophagitis: Secondary | ICD-10-CM | POA: Diagnosis not present

## 2014-07-10 DIAGNOSIS — K449 Diaphragmatic hernia without obstruction or gangrene: Secondary | ICD-10-CM | POA: Diagnosis not present

## 2014-07-10 DIAGNOSIS — Z7982 Long term (current) use of aspirin: Secondary | ICD-10-CM | POA: Diagnosis not present

## 2014-07-10 DIAGNOSIS — I2510 Atherosclerotic heart disease of native coronary artery without angina pectoris: Secondary | ICD-10-CM | POA: Insufficient documentation

## 2014-07-10 DIAGNOSIS — Z79899 Other long term (current) drug therapy: Secondary | ICD-10-CM | POA: Insufficient documentation

## 2014-07-10 HISTORY — DX: Personal history of other diseases of the digestive system: Z87.19

## 2014-07-10 HISTORY — PX: ESOPHAGOGASTRODUODENOSCOPY: SHX5428

## 2014-07-10 SURGERY — EGD (ESOPHAGOGASTRODUODENOSCOPY)
Anesthesia: Moderate Sedation

## 2014-07-10 MED ORDER — MIDAZOLAM HCL 10 MG/2ML IJ SOLN
INTRAMUSCULAR | Status: DC | PRN
  Administered 2014-07-10: 1 mg via INTRAVENOUS
  Administered 2014-07-10: .5 mg via INTRAVENOUS
  Administered 2014-07-10: 1 mg via INTRAVENOUS

## 2014-07-10 MED ORDER — SODIUM CHLORIDE 0.9 % IV SOLN
INTRAVENOUS | Status: DC
  Administered 2014-07-10: 500 mL via INTRAVENOUS

## 2014-07-10 MED ORDER — FENTANYL CITRATE 0.05 MG/ML IJ SOLN
INTRAMUSCULAR | Status: AC
  Filled 2014-07-10: qty 2

## 2014-07-10 MED ORDER — MIDAZOLAM HCL 10 MG/2ML IJ SOLN
INTRAMUSCULAR | Status: AC
  Filled 2014-07-10: qty 2

## 2014-07-10 MED ORDER — FENTANYL CITRATE 0.05 MG/ML IJ SOLN
INTRAMUSCULAR | Status: DC | PRN
  Administered 2014-07-10: 10 ug via INTRAVENOUS
  Administered 2014-07-10: 5 ug via INTRAVENOUS
  Administered 2014-07-10: 10 ug via INTRAVENOUS

## 2014-07-10 MED ORDER — BUTAMBEN-TETRACAINE-BENZOCAINE 2-2-14 % EX AERO
INHALATION_SPRAY | CUTANEOUS | Status: DC | PRN
  Administered 2014-07-10: 1 via TOPICAL

## 2014-07-10 NOTE — Op Note (Signed)
Problem: Epigastric abdominal pain. Normal abdominal ultrasound performed on 05/08/2014. Barium upper GI x-ray series showed a large hiatal hernia containing his entire stomach except for the distal gastric antrum on 03/27/2014. The daily aspirin use.  Endoscopist: Earle Gell  Premedication: Versed 2.5 mg. Fentanyl 25 mcg  Procedure: Diagnostic esophagogastroduodenoscopy The patient was placed in the left lateral decubitus position. The Pentax gastroscope was passed through the posterior hypopharynx into the proximal esophagus without difficulty. The hypopharynx, larynx, and vocal cords appeared normal.  Esophagoscopy: The proximal, mid, and lower segments of the esophageal mucosa appeared normal. The squamocolumnar junction is regular in appearance and noted at 35 cm from the incisor teeth.  Gastroscopy: The patient's entire stomach is located in the chest cavity except for the distal gastric antrum. Retroflexed view of the gastric cardia and fundus was normal. The gastric body, antrum, and pylorus appeared normal.  Duodenoscopy: The duodenal bulb and descending duodenum appeared normal  Assessment:  #1. Extremely large hiatal hernia containing the entire stomach except for the distal gastric antrum  #2. Otherwise normal esophagogastroduodenoscopy  Recommendation: The patient's attacks of epigastric pain associated with food intake are probably related to gastric ischemia or gastric outlet obstruction associated with his extremely large hiatal hernia. The gastric mucosa appears normal. The patient successfully underwent inguinal hernia repair last year. I recommend referring him to Dr. Fanny Skates to discuss the risks of laparoscopic hiatal hernia repair. If he is a surgical candidate, he will require a preoperative high-resolution esophageal manometry.

## 2014-07-10 NOTE — H&P (Signed)
  Problem: Epigastric abdominal pain associated with an extremely large hiatal hernia  History: The patient is a 80 year old male born [birth year]. He underwent a normal abdominal ultrasound on 05/08/2014. On 03/27/2014 his barium upper GI x-ray series showed a large hiatal hernia containing his entire stomach except for the distal gastric antrum.  The patient takes aspirin on a daily basis.  Over the past few months, the patient has had episodes of meal related early satiety, nausea, and intense epigastric pain usually lasting up to 4 hours in duration before completely resolving. Between  attacks, the patient is able to consume food without abdominal pain. He reports no gastrointestinal bleeding. He has developed sitophobia associated with weight loss.  The patient resides alone after losing his wife not too long ago.  The patient is scheduled to undergo diagnostic esophagogastroduodenoscopy to rule out ulcer disease and signs of gastric ischemia.  Past medical history: Tonsillectomy. Inguinal hernia repair 2014. Chronic kidney disease (stage III). Benign prostatic hypertrophy. Osteopenia. Osteoarthritis. Gout. Coronary artery disease. Gastroesophageal reflux. Migraine headache syndrome. Lumbar radiculopathy. Right eye cataracts. Distal radius fracture in 2013.  Medication allergies: Penicillin  Exam: The patient is alert and lying comfortably on the endoscopy stretcher. Abdomen is soft and nontender to palpation. Lungs are clear to auscultation. Cardiac exam reveals a regular rhythm.  Plan: Proceed with diagnostic esophagogastroduodenoscopy to evaluate epigastric abdominal pain associated with an extremely large hiatal hernia to rule out ulcer disease associated with chronic aspirin use.

## 2014-07-11 ENCOUNTER — Encounter (HOSPITAL_COMMUNITY): Payer: Self-pay | Admitting: Gastroenterology

## 2014-07-16 DIAGNOSIS — K449 Diaphragmatic hernia without obstruction or gangrene: Secondary | ICD-10-CM | POA: Diagnosis not present

## 2014-07-27 ENCOUNTER — Encounter (INDEPENDENT_AMBULATORY_CARE_PROVIDER_SITE_OTHER): Payer: Medicare Other | Admitting: General Surgery

## 2014-07-27 ENCOUNTER — Telehealth (INDEPENDENT_AMBULATORY_CARE_PROVIDER_SITE_OTHER): Payer: Self-pay

## 2014-07-27 NOTE — Telephone Encounter (Signed)
Spoke with patient , denies N/V, Temp. He states he has had these attack for several weeks , attacks usually  fade with time.  Attack started last night and continues this morning . He describes the symptoms of fullness . He was able to eat last night but he has not ate anything this am. Dr. Redmond Pulling advised for patient start a liquid diet  and to keep appt with Dr. Dalbert Batman 07/30/14 2p , if his symptoms becomes worse to call or go to the ED. Patient verbalized understanding

## 2014-07-30 ENCOUNTER — Ambulatory Visit (INDEPENDENT_AMBULATORY_CARE_PROVIDER_SITE_OTHER): Payer: Medicare Other | Admitting: General Surgery

## 2014-07-30 ENCOUNTER — Encounter (INDEPENDENT_AMBULATORY_CARE_PROVIDER_SITE_OTHER): Payer: Medicare Other | Admitting: General Surgery

## 2014-07-30 ENCOUNTER — Encounter (INDEPENDENT_AMBULATORY_CARE_PROVIDER_SITE_OTHER): Payer: Self-pay | Admitting: General Surgery

## 2014-07-30 VITALS — BP 130/62 | HR 63 | Temp 97.8°F | Ht 67.0 in | Wt 132.5 lb

## 2014-07-30 DIAGNOSIS — K440 Diaphragmatic hernia with obstruction, without gangrene: Secondary | ICD-10-CM

## 2014-07-30 NOTE — Patient Instructions (Signed)
You have a giant hiatal hernia. It is intermittently twisting and obstructing you and that is the cause of your pain. This can progress and become more dangerous.  We have talked about surgery and the risks involved in that as well, especially at your advanced age.  I have told you that I would perform the surgery if you and your family decid that you want to have this done.  Talk this over with your family and let me know which your decision is in a few days.     Hiatal Hernia A hiatal hernia occurs when part of your stomach slides above the muscle that separates your abdomen from your chest (diaphragm). You can be born with a hiatal hernia (congenital), or it may develop over time. In almost all cases of hiatal hernia, only the top part of the stomach pushes through.  Many people have a hiatal hernia with no symptoms. The larger the hernia, the more likely that you will have symptoms. In some cases, a hiatal hernia allows stomach acid to flow back into the tube that carries food from your mouth to your stomach (esophagus). This may cause heartburn symptoms. Severe heartburn symptoms may mean you have developed a condition called gastroesophageal reflux disease (GERD).  CAUSES  Hiatal hernias are caused by a weakness in the opening (hiatus) where your esophagus passes through your diaphragm to attach to the upper part of your stomach. You may be born with a weakness in your hiatus, or a weakness can develop. RISK FACTORS Older age is a major risk factor for a hiatal hernia. Anything that increases pressure on your diaphragm can also increase your risk of a hiatal hernia. This includes:  Pregnancy.  Excess weight.  Frequent constipation. SIGNS AND SYMPTOMS  People with a hiatal hernia often have no symptoms. If symptoms develop, they are almost always caused by GERD. They may include:  Heartburn.  Belching.  Indigestion.  Trouble swallowing.  Coughing or wheezing.  Sore  throat.  Hoarseness.  Chest pain. DIAGNOSIS  A hiatal hernia is sometimes found during an exam for another problem. Your health care provider may suspect a hiatal hernia if you have symptoms of GERD. Tests may be done to diagnose GERD. These may include:  X-rays of your stomach or chest.  An upper gastrointestinal (GI) series. This is an X-ray exam of your GI tract involving the use of a chalky liquid that you swallow. The liquid shows up clearly on the X-ray.  Endoscopy. This is a procedure to look into your stomach using a thin, flexible tube that has a tiny camera and light on the end of it. TREATMENT  If you have no symptoms, you may not need treatment. If you have symptoms, treatment may include:  Dietary and lifestyle changes to help reduce GERD symptoms.  Medicines. These may include:  Over-the-counter antacids.  Medicines that make your stomach empty more quickly.  Medicines that block the production of stomach acid (H2 blockers).  Stronger medicines to reduce stomach acid (proton pump inhibitors).  You may need surgery to repair the hernia if other treatments are not helping. HOME CARE INSTRUCTIONS   Take all medicines as directed by your health care provider.  Quit smoking, if you smoke.  Try to achieve and maintain a healthy body weight.  Eat frequent small meals instead of three large meals a day. This keeps your stomach from getting too full.  Eat slowly.  Do not lie down right after eating.  Do  noteat 1-2 hours before bed.   Do not drink beverages with caffeine. These include cola, coffee, cocoa, and tea.  Do not drink alcohol.  Avoid foods that can make symptoms of GERD worse. These may include:  Fatty foods.  Citrus fruits.  Other foods and drinks that contain acid.  Avoid putting pressure on your belly. Anything that puts pressure on your belly increases the amount of acid that may be pushed up into your esophagus.   Avoid bending over,  especially after eating.  Raise the head of your bed by putting blocks under the legs. This keeps your head and esophagus higher than your stomach.  Do not wear tight clothing around your chest or stomach.  Try not to strain when having a bowel movement, when urinating, or when lifting heavy objects. SEEK MEDICAL CARE IF:  Your symptoms are not controlled with medicines or lifestyle changes.  You are having trouble swallowing.  You have coughing or wheezing that will not go away. SEEK IMMEDIATE MEDICAL CARE IF:  Your pain is getting worse.  Your pain spreads to your arms, neck, jaw, teeth, or back.  You have shortness of breath.  You sweat for no reason.  You feel sick to your stomach (nauseous) or vomit.  You vomit blood.  You have bright red blood in your stools.  You have black, tarry stools.  Document Released: 02/06/2004 Document Revised: 04/02/2014 Document Reviewed: 11/03/2013 [facility] Patient Information 2015 [place], [place]. This information is not intended to replace advice given to you by your health care provider. Make sure you discuss any questions you have with your health care provider.

## 2014-07-30 NOTE — Progress Notes (Signed)
Patient ID: Larry Mcfarland, male   DOB: [birth year], 80 y.o.   MRN: [id number]  Chief Complaint  Patient presents with  . New Evaluation    Hiatal hernia    HPI Larry Mcfarland is a 80 y.o. male.  He is referred back to me by Dr. Dr. Earle Mcfarland for evaluation of a giant hiatal hernia that is symptomatic. At [facility] Mcfarland is his PCP.  I have known this elderly gentleman for a year.I re[paired his inguinal hernia and he did well from that. His wife passed away 2 years ago. He lives alone. His son lives in [place]. He's had a 10 pound weight loss over the past year. For the past few weeks he has been having intermittent episodes of epigastric pain ,   bloated and nauseated but he cannot vomit. Patient is doing to the back and left shoulder. This is is very distressing. This lasts about 6 hours. This often occurs after dinner. He has an episode about every 2 or 3 weeks.   On 03/27/2014 UGI shows a large type III  hiatal hernia but no obstruction. Tertiary contractions of the esophagus noted.  05/08/2014 an ultrasound which showed a normal gallbladder  Upper endoscopy performed on 07/10/2014 shows extremely large hiatal hernia containing the entire stomach except for the distal gastric antrum, otherwise normal. Mucosa looks normal. First and second portions of the duodenum were visualized and looked normal.  He continues to play golf  HPI  Past Medical History  Diagnosis Date  . Hypertension   . Hyperlipemia   . Inguinal hernia without mention of obstruction or gangrene, unilateral or unspecified, (not specified as recurrent)   . CKD (chronic kidney disease), stage III   . Anxiety   . Osteopenia 2002  . BPH (benign prostatic hyperplasia)   . Osteoarthritis   . Gout   . CAD (coronary artery disease)   . GERD (gastroesophageal reflux disease)   . Vision disturbance   . Lumbar radiculopathy   . Vertigo 1999    negative ct scan head; rare recurrance, last in 2013  . Bleeding  from the nose 2009    cautery by Dr. Erik Mcfarland  . Cataract     right eye  . Impaired fasting glucose   . Skin cancer of forehead     left (BCE)  . Distal radius fracture 2013    Dr. Fredna Mcfarland  . Major depressive disorder, single episode, unspecified   . Nosebleed     most recent 05/24/13  to have  cauterized prior to surgery   . Dysrhythmia     hx of atrial fib- 10 years ago   . H/O hiatal hernia     Past Surgical History  Procedure Laterality Date  . Tonsillectomy      at age 60 yrs old  . Eye surgery      right eye cataract surgery   . Inguinal hernia repair Right 06/07/2013    Procedure: LAPAROSCOPIC INGUINAL HERNIA REPAIR;  Surgeon: Larry Hector, MD;  Location: [facility];  Service: General;  Laterality: Right;  With Mesh  . Insertion of mesh Right 06/07/2013    Procedure: INSERTION OF MESH;  Surgeon: Larry Hector, MD;  Location: [facility];  Service: General;  Laterality: Right;  . Esophagogastroduodenoscopy N/A 07/10/2014    Procedure: ESOPHAGOGASTRODUODENOSCOPY (EGD);  Surgeon: Larry Fair, MD;  Location: [facility];  Service: Endoscopy;  Laterality: N/A;    Family History  Problem Relation Age of Onset  .  Heart disease Mother   . Heart disease Father   . Cancer Sister     breast     Social History History  Substance Use Topics  . Smoking status: Never Smoker   . Smokeless tobacco: Never Used  . Alcohol Use: Yes     Comment: occasional    Allergies  Allergen Reactions  . Penicillins Nausea Only    Upset stomach    Current Outpatient Prescriptions  Medication Sig Dispense Refill  . ALPRAZolam (XANAX) 0.25 MG tablet Take 0.125 mg by mouth at bedtime as needed for sleep.       Marland Kitchen amLODipine (NORVASC) 2.5 MG tablet Take 2.5 mg by mouth daily.      Marland Kitchen aspirin 81 MG tablet Take 81 mg by mouth daily.      Marland Kitchen lisinopril (PRINIVIL,ZESTRIL) 20 MG tablet Take 10 mg by mouth 2 (two) times daily.       . metoprolol tartrate (LOPRESSOR) 25 MG tablet Take 12.5 mg by mouth 2  (two) times daily.      Marland Kitchen omeprazole (PRILOSEC) 20 MG capsule Take 40 mg by mouth daily before breakfast.       . pravastatin (PRAVACHOL) 20 MG tablet Take 20 mg by mouth at bedtime.      . tamsulosin (FLOMAX) 0.4 MG CAPS Take 0.4 mg by mouth daily after supper.      . cholecalciferol (VITAMIN D) 1000 UNITS tablet Take 1,000 Units by mouth daily.       . Multiple Vitamin (MULTIVITAMIN WITH MINERALS) TABS tablet Take 0.5 tablets by mouth daily. Centrum Silver       No current facility-administered medications for this visit.    Review of Systems Review of Systems  Constitutional: Positive for unexpected weight change. Negative for fever and chills.  HENT: Negative for congestion, hearing loss, sore throat, trouble swallowing and voice change.   Eyes: Negative for visual disturbance.  Respiratory: Negative for cough and wheezing.   Cardiovascular: Negative for chest pain, palpitations and leg swelling.  Gastrointestinal: Positive for nausea and abdominal pain. Negative for vomiting, diarrhea, constipation, blood in stool, abdominal distention, anal bleeding and rectal pain.  Genitourinary: Negative for hematuria and difficulty urinating.  Musculoskeletal: Negative for arthralgias.  Skin: Negative for rash and wound.  Neurological: Negative for seizures, syncope, weakness and headaches.  Hematological: Negative for adenopathy. Does not bruise/bleed easily.  Psychiatric/Behavioral: Negative for confusion.    Blood pressure 130/62, pulse 63, temperature 97.8 F (36.6 C), temperature source Oral, height 5\' 7"  (1.702 m), weight 132 lb 8 oz (60.102 kg).  Physical Exam Physical Exam  Constitutional: He is oriented to person, place, and time. He appears well-developed and well-nourished. No distress.  HENT:  Head: Normocephalic.  Nose: Nose normal.  Mouth/Throat: No oropharyngeal exudate.  Eyes: Conjunctivae and EOM are normal. Pupils are equal, round, and reactive to light. Right eye  exhibits no discharge. Left eye exhibits no discharge. No scleral icterus.  Neck: Normal range of motion. Neck supple. No JVD present. No tracheal deviation present. No thyromegaly present.  Cardiovascular: Normal rate, regular rhythm, normal heart sounds and intact distal pulses.   No murmur heard. Pulmonary/Chest: Effort normal and breath sounds normal. No stridor. No respiratory distress. He has no wheezes. He has no rales. He exhibits no tenderness.  Abdominal: Soft. Bowel sounds are normal. He exhibits no distension and no mass. There is no tenderness. There is no rebound and no guarding.  Infraumbilical incision well healed no hernia. No inguinal  hernia. No scrotal mass.  Musculoskeletal: Normal range of motion. He exhibits no edema and no tenderness.  Lymphadenopathy:    He has no cervical adenopathy.  Neurological: He is alert and oriented to person, place, and time. He has normal reflexes. Coordination normal.  Skin: Skin is warm and dry. No rash noted. He is not diaphoretic. No erythema. No pallor.  Psychiatric: He has a normal mood and affect. His behavior is normal. Judgment and thought content normal.    Data Reviewed Upper GI, endoscopy, old records  Assessment    Diet, type III hiatal hernia. He is having intermittent obstructive symptoms. He is at risk for incarceration, strangulation.  Hypertension  Chronic kidney disease  BPH  Anxiety  Coronary disease  Single episode of atrial fibrillation the past     Plan    I had a long talk with the patient as well as having a long conversation with the patient's son in [place]. I basically told him of the natural history and risk of incarceration and strangulation which could result in a surgical crisis. I told him I did not know if that would happen but I have no way to mitigate it.    . Also told him that if we perform surgery I would perform a laparoscopic reduction of the hernia, stripping of the sac, closure of the  diaphragm and a gastrostomy tube. Given his general good health if he does not have a complication he probably would do well. If he has a major complication, however, he probably would not survive. We talked about this for a long time.  At the end of the discussion I told him that I was willing to operate on him as long as we all agreed that we understood the risks. He is going to go home and talk about this with his family and call back.        INGRAM,HAYWOOD M 07/30/2014, 3:50 PM

## 2014-08-13 DIAGNOSIS — I10 Essential (primary) hypertension: Secondary | ICD-10-CM | POA: Diagnosis not present

## 2014-08-20 DIAGNOSIS — Z23 Encounter for immunization: Secondary | ICD-10-CM | POA: Diagnosis not present

## 2014-08-20 DIAGNOSIS — K449 Diaphragmatic hernia without obstruction or gangrene: Secondary | ICD-10-CM | POA: Diagnosis not present

## 2014-08-20 DIAGNOSIS — E7800 Pure hypercholesterolemia, unspecified: Secondary | ICD-10-CM | POA: Diagnosis not present

## 2014-08-20 DIAGNOSIS — N183 Chronic kidney disease, stage 3 unspecified: Secondary | ICD-10-CM | POA: Diagnosis not present

## 2014-08-20 DIAGNOSIS — K219 Gastro-esophageal reflux disease without esophagitis: Secondary | ICD-10-CM | POA: Diagnosis not present

## 2014-08-20 DIAGNOSIS — I10 Essential (primary) hypertension: Secondary | ICD-10-CM | POA: Diagnosis not present

## 2014-09-04 ENCOUNTER — Encounter (INDEPENDENT_AMBULATORY_CARE_PROVIDER_SITE_OTHER): Payer: Self-pay | Admitting: General Surgery

## 2014-09-04 ENCOUNTER — Other Ambulatory Visit (INDEPENDENT_AMBULATORY_CARE_PROVIDER_SITE_OTHER): Payer: Self-pay | Admitting: General Surgery

## 2014-09-04 DIAGNOSIS — I2510 Atherosclerotic heart disease of native coronary artery without angina pectoris: Secondary | ICD-10-CM | POA: Diagnosis not present

## 2014-09-04 DIAGNOSIS — I10 Essential (primary) hypertension: Secondary | ICD-10-CM | POA: Diagnosis not present

## 2014-09-04 DIAGNOSIS — K440 Diaphragmatic hernia with obstruction, without gangrene: Secondary | ICD-10-CM | POA: Diagnosis not present

## 2014-09-04 DIAGNOSIS — N400 Enlarged prostate without lower urinary tract symptoms: Secondary | ICD-10-CM | POA: Diagnosis not present

## 2014-09-04 DIAGNOSIS — N183 Chronic kidney disease, stage 3 (moderate): Secondary | ICD-10-CM | POA: Diagnosis not present

## 2014-09-19 ENCOUNTER — Encounter: Payer: Self-pay | Admitting: *Deleted

## 2014-09-24 NOTE — Progress Notes (Signed)
Patient ID: Larry Mcfarland, male   DOB: Oct 28, 1921, 80 y.o.   MRN: [id number]    80 yo referred for cardiac clearance.  Has a large hiatal  hernia causing significant pain, indigestion and bloating.  Reading Dr Darrel Hoover note there appears also to be a risk of entrapment and strangulation.  He has no active cardiac history and had He had uncomplicated inguinal hernia surgery with general anesthesia about a year ago  He is on good Rx for HTN and elevated lipids Distant history of PAF over 10 years ago with no recurrence Occasionally gets "double" beat but nothing sustained.  His attacks are GI in nature  Had bad spell this weekend after eating  Epigastirc pain vomiting and nausea.  Lasted most of the day.  Discussed nature of laprascopic stomach surgery and primary risk being his age and possibly PAF.  He " has to get something done Can;t live like this"   ROS: Denies fever, malais, weight loss, blurry vision, decreased visual acuity, cough, sputum, SOB, hemoptysis, pleuritic pain, palpitaitons, heartburn, abdominal pain, melena, lower extremity edema, claudication, or rash.  All other systems reviewed and negative   General: Affect appropriate Healthy:  appears stated age 29: normal Neck supple with no adenopathy JVP normal no bruits no thyromegaly Lungs clear with no wheezing and good diaphragmatic motion Heart:  S1/S2 no murmur,rub, gallop or click PMI normal Abdomen: benighn, BS positve, no tenderness, no AAA no bruit.  No HSM or HJR Distal pulses intact with no bruits No edema Neuro non-focal Skin warm and dry No muscular weakness  Medications Current Outpatient Prescriptions  Medication Sig Dispense Refill  . ALPRAZolam (XANAX) 0.25 MG tablet Take 0.125 mg by mouth at bedtime as needed for sleep.       Marland Kitchen amLODipine (NORVASC) 2.5 MG tablet Take 2.5 mg by mouth daily.      Marland Kitchen aspirin 81 MG tablet Take 81 mg by mouth daily.      . cholecalciferol (VITAMIN D) 1000 UNITS tablet  Take 1,000 Units by mouth daily.       Marland Kitchen lisinopril (PRINIVIL,ZESTRIL) 20 MG tablet Take 10 mg by mouth 2 (two) times daily.       . metoprolol tartrate (LOPRESSOR) 25 MG tablet Take 12.5 mg by mouth 2 (two) times daily.      . Multiple Vitamin (MULTIVITAMIN WITH MINERALS) TABS tablet Take 0.5 tablets by mouth daily. Centrum Silver      . omeprazole (PRILOSEC) 20 MG capsule Take 40 mg by mouth daily before breakfast.       . pravastatin (PRAVACHOL) 20 MG tablet Take 20 mg by mouth at bedtime.      . tamsulosin (FLOMAX) 0.4 MG CAPS Take 0.4 mg by mouth daily after supper.       No current facility-administered medications for this visit.    Allergies Penicillins  Family History: Family History  Problem Relation Age of Onset  . Heart disease Mother   . Heart disease Father   . Cancer Sister     breast     Social History: History   Social History  . Marital Status: Widowed    Spouse Name: N/A    Number of Children: N/A  . Years of Education: N/A   Occupational History  . Not on file.   Social History Main Topics  . Smoking status: Never Smoker   . Smokeless tobacco: Never Used  . Alcohol Use: Yes     Comment: occasional  . Drug  Use: No  . Sexual Activity: Not on file   Other Topics Concern  . Not on file   Social History Narrative  . No narrative on file   Past Surgical History  Procedure Laterality Date  . Tonsillectomy      at age 8 yrs old  . Eye surgery      right eye cataract surgery   . Inguinal hernia repair Right 06/07/2013    Procedure: LAPAROSCOPIC INGUINAL HERNIA REPAIR;  Surgeon: Adin Hector, MD;  Location: [facility];  Service: General;  Laterality: Right;  With Mesh  . Insertion of mesh Right 06/07/2013    Procedure: INSERTION OF MESH;  Surgeon: Adin Hector, MD;  Location: [facility];  Service: General;  Laterality: Right;  . Esophagogastroduodenoscopy N/A 07/10/2014    Procedure: ESOPHAGOGASTRODUODENOSCOPY (EGD);  Surgeon: Garlan Fair, MD;   Location: [facility];  Service: Endoscopy;  Laterality: N/A;    Electrocardiogram:  4/15  SR read as AS infarct but normal for age and lead placement   Assessment and Plan

## 2014-09-25 ENCOUNTER — Ambulatory Visit (INDEPENDENT_AMBULATORY_CARE_PROVIDER_SITE_OTHER): Payer: Medicare Other | Admitting: Cardiovascular Disease

## 2014-09-25 ENCOUNTER — Encounter: Payer: Self-pay | Admitting: Cardiovascular Disease

## 2014-09-25 VITALS — BP 128/60 | HR 83 | Ht 67.0 in | Wt 130.0 lb

## 2014-09-25 DIAGNOSIS — I480 Paroxysmal atrial fibrillation: Secondary | ICD-10-CM

## 2014-09-25 DIAGNOSIS — I10 Essential (primary) hypertension: Secondary | ICD-10-CM

## 2014-09-25 DIAGNOSIS — Z01810 Encounter for preprocedural cardiovascular examination: Secondary | ICD-10-CM | POA: Diagnosis not present

## 2014-09-25 NOTE — Assessment & Plan Note (Signed)
Clear to have surgery without further testing No active cardiac issues no complications with general anesthesia 7/14 , normal ECG

## 2014-09-25 NOTE — Assessment & Plan Note (Signed)
No recurrence but with likely PAC;s and age at risk of post op PAF  Continue beta blocker including morning of surgery  Should be on telemetry floor post op

## 2014-09-25 NOTE — Assessment & Plan Note (Signed)
Well controlled.  Continue current medications and low sodium Dash type diet.    

## 2014-09-25 NOTE — Patient Instructions (Signed)
Your physician recommends that you schedule a follow-up appointment in: AS NEEDED  Your physician recommends that you continue on your current medications as directed. Please refer to the Current Medication list given to you today.  

## 2014-09-28 NOTE — Patient Instructions (Addendum)
22 Larry Mcfarland  09/28/2014   Your procedure is scheduled on: Wednesday November 4 th, 2015  Report to [facility]   Entrance and follow signs to  [place] at 630 AM.  Call this number if you have problems the morning of surgery [phone]   Remember:  Do not eat food or drink liquids :After Midnight.     Take these medicines the morning of surgery with A SIP OF WATER: Metoprolol;Omeprazole;Amlodipine if needed                                You may not have any metal on your body including hair pins and piercings  Do not wear jewelry, lotions, powders, or deodorant.   Men may shave face and neck.  Do not bring valuables to the hospital. [place].  Contacts, dentures or bridgework may not be worn into surgery.  Leave suitcase in the car. After surgery it may be brought to your room.  For patients admitted to the hospital, checkout time is 11:00 AM the day of discharge.   Special Instructions: N/A ________________________________________________________________________  [facility] - Preparing for Surgery Before surgery, you can play an important role.  Because skin is not sterile, your skin needs to be as free of germs as possible.  You can reduce the number of germs on your skin by washing with CHG (chlorahexidine gluconate) soap before surgery.  CHG is an antiseptic cleaner which kills germs and bonds with the skin to continue killing germs even after washing. Please DO NOT use if you have an allergy to CHG or antibacterial soaps.  If your skin becomes reddened/irritated stop using the CHG and inform your nurse when you arrive at Short Stay. Do not shave (including legs and underarms) for at least 48 hours prior to the first CHG shower.  You may shave your face/neck. Please follow these instructions carefully:  1.  Shower with CHG Soap the night before surgery and the  morning of Surgery.  2.  If you choose to  wash your hair, wash your hair first as usual with your  normal  shampoo.  3.  After you shampoo, rinse your hair and body thoroughly to remove the  shampoo.                           4.  Use CHG as you would any other liquid soap.  You can apply chg directly  to the skin and wash                       Gently with a scrungie or clean washcloth.  5.  Apply the CHG Soap to your body ONLY FROM THE NECK DOWN.   Do not use on face/ open                           Wound or open sores. Avoid contact with eyes, ears mouth and genitals (private parts).                       Wash face,  Genitals (private parts) with your normal soap.             6.  Wash thoroughly, paying special attention to the area where your  surgery  will be performed.  7.  Thoroughly rinse your body with warm water from the neck down.  8.  DO NOT shower/wash with your normal soap after using and rinsing off  the CHG Soap.                9.  Pat yourself dry with a clean towel.            10.  Wear clean pajamas.            11.  Place clean sheets on your bed the night of your first shower and do not  sleep with pets. Day of Surgery : Do not apply any lotions/deodorants the morning of surgery.  Please wear clean clothes to the hospital/surgery center.  FAILURE TO FOLLOW THESE INSTRUCTIONS MAY RESULT IN THE CANCELLATION OF YOUR SURGERY PATIENT SIGNATURE_________________________________  NURSE SIGNATURE__________________________________  ________________________________________________________________________

## 2014-09-30 NOTE — H&P (Signed)
Larry Mcfarland  Location: [facility] Patient #: [id number] DOB: 05-Apr-1921 Widowed / Language: Cleophus Molt / Race: White Male      History of Present Illness Patient words: hernia.  The patient is a 80 year old male who presents with a hiatal hernia. Patient returns and wants to have his hiatal hernia repair. He's had at least 2 more attacks one of which was worse than the others. He tried to vomit but could not. The pain resolved after about 5 hours. He has felt fine in between. GHe state trhat he cannot continue to live like this.Able to swallow and having normal bowel movements. His son considered a second opinion that due to the patient has declined that.. Recall that he has been having obstructive symptoms from his giant hiatal hernia. Upper GI showed large type III hiatal hernia but no obstruction. Ultrasound showed a normal gallbladder. EGD  showed very large hiatal hernia containing entire stomach except for distal gastric antrum otherwise normal. Healthy mucosa. First and second duodenum was normal. He continues to play golf and is otherwise active. Comorbidities include hypertension. Chronic kidney disease stage III, anxiety, BPH, remote history CAD with cardiac cath by Dr. Daneen Schick in the past, hypertension, single episode of atrial fibrillation in past. Functional status is actually fairly good independent.    We will proceed with planning this surgery. He knows he is high risk.  He understands the indications, details, techniques and numerous risks of this surgery.  He is aware of the risk of bleeding, splenic injury, infection, perforation with immediate or delayed recognition, lung injury, recurrence. He is aware that due to advanced age he could not tolerate a complication and would likely not survive.He understands all of this. All of his questions are answered, and he wishes to proceed.   Allergies  Penicillins  Medication History  Xanax (0.25MG  Tablet,  Oral) Active. AmLODIPine Besylate (2.5MG  Tablet, Oral) Active. Lisinopril (20MG  Tablet, Oral) Active. Metoprolol Tartrate (25MG  Tablet, Oral) Active. PriLOSEC OTC (20MG  Tablet DR, Oral) Active. Pravachol (20MG  Tablet, Oral) Active. Flomax (0.4MG  Capsule, Oral) Active. Medications Reconciled  Vitals  09/04/2014 4:14 PM Weight: 133 lb Temp.: 34F(Oral)  Pulse: 65 (Regular)  BP: 140/62 (Sitting, Left Arm, Standard)    Physical Exam General Note: Alert. Oriented. No distress. Cooperative.   Head and Neck Note: Neck reveals no adenopathy or mass. Good range of motion. Trachea midline   Chest and Lung Exam Note: Lungs are clear to auscultation. No chest wall tenderness.   Cardiovascular Note: Heart reveals regular rate and rhythm. No ectopy. No murmur.   Abdomen Note: Abdomen is soft and scaphoid. Nontender. No mass. Infraumbilical scar.. No hernia.     Assessment & Plan HIATAL HERNIA WITH OBSTRUCTION BUT NO GANGRENE (552.3  K44.0) Impression: Giant, type III hernia with intermittent obstructive symptoms, progressive Despite his advanced age, I think it is in his best interest to undergo reduction of hiatal hernia, diaphragmatic tightening, gastrostomy tube Preoperative cardiac clearance will be important, and he will be referred  Current Plans Schedule for Surgery  MILD CAD (414.00  I25.10) Current Plans Referred to Cardiology, for evaluation and follow up (Cardiology). Refer patient back for follow-up and treatment: advise as to diagnosis, suggest medication or treatment.  HYPERTENSION, BENIGN (401.1  I10)  CKD (CHRONIC KIDNEY DISEASE), STAGE III (585.3  N18.3)  BPH WITHOUT URINARY OBSTRUCTION (600.00  N40.0)     Edsel Petrin. Dalbert Batman, M.D., [facility] General and Minimally invasive Surgery Breast and  Colorectal Surgery Office:   [phone] Pager:   [phone]

## 2014-10-01 ENCOUNTER — Encounter (HOSPITAL_COMMUNITY): Payer: Self-pay

## 2014-10-01 ENCOUNTER — Encounter (HOSPITAL_COMMUNITY)
Admission: RE | Admit: 2014-10-01 | Discharge: 2014-10-01 | Disposition: A | Discharge status: Home / Self Care | Payer: Medicare Other | Source: Ambulatory Visit | Attending: Surgery | Admitting: Surgery

## 2014-10-01 DIAGNOSIS — E785 Hyperlipidemia, unspecified: Secondary | ICD-10-CM | POA: Diagnosis not present

## 2014-10-01 DIAGNOSIS — N183 Chronic kidney disease, stage 3 (moderate): Secondary | ICD-10-CM | POA: Diagnosis not present

## 2014-10-01 DIAGNOSIS — Z8249 Family history of ischemic heart disease and other diseases of the circulatory system: Secondary | ICD-10-CM | POA: Diagnosis not present

## 2014-10-01 DIAGNOSIS — E784 Other hyperlipidemia: Secondary | ICD-10-CM | POA: Diagnosis present

## 2014-10-01 DIAGNOSIS — J90 Pleural effusion, not elsewhere classified: Secondary | ICD-10-CM | POA: Diagnosis not present

## 2014-10-01 DIAGNOSIS — N400 Enlarged prostate without lower urinary tract symptoms: Secondary | ICD-10-CM | POA: Diagnosis not present

## 2014-10-01 DIAGNOSIS — M6281 Muscle weakness (generalized): Secondary | ICD-10-CM | POA: Diagnosis not present

## 2014-10-01 DIAGNOSIS — F419 Anxiety disorder, unspecified: Secondary | ICD-10-CM | POA: Diagnosis present

## 2014-10-01 DIAGNOSIS — I2510 Atherosclerotic heart disease of native coronary artery without angina pectoris: Secondary | ICD-10-CM | POA: Diagnosis not present

## 2014-10-01 DIAGNOSIS — Z803 Family history of malignant neoplasm of breast: Secondary | ICD-10-CM | POA: Diagnosis not present

## 2014-10-01 DIAGNOSIS — M1990 Unspecified osteoarthritis, unspecified site: Secondary | ICD-10-CM | POA: Diagnosis present

## 2014-10-01 DIAGNOSIS — E440 Moderate protein-calorie malnutrition: Secondary | ICD-10-CM | POA: Diagnosis not present

## 2014-10-01 DIAGNOSIS — I129 Hypertensive chronic kidney disease with stage 1 through stage 4 chronic kidney disease, or unspecified chronic kidney disease: Secondary | ICD-10-CM | POA: Diagnosis present

## 2014-10-01 DIAGNOSIS — K440 Diaphragmatic hernia with obstruction, without gangrene: Secondary | ICD-10-CM | POA: Diagnosis not present

## 2014-10-01 DIAGNOSIS — Z48815 Encounter for surgical aftercare following surgery on the digestive system: Secondary | ICD-10-CM | POA: Diagnosis not present

## 2014-10-01 DIAGNOSIS — Z931 Gastrostomy status: Secondary | ICD-10-CM | POA: Diagnosis not present

## 2014-10-01 DIAGNOSIS — Z880 Allergy status to penicillin: Secondary | ICD-10-CM | POA: Diagnosis not present

## 2014-10-01 DIAGNOSIS — I471 Supraventricular tachycardia: Secondary | ICD-10-CM | POA: Diagnosis not present

## 2014-10-01 DIAGNOSIS — Z6821 Body mass index (BMI) 21.0-21.9, adult: Secondary | ICD-10-CM | POA: Diagnosis not present

## 2014-10-01 DIAGNOSIS — K219 Gastro-esophageal reflux disease without esophagitis: Secondary | ICD-10-CM | POA: Diagnosis not present

## 2014-10-01 DIAGNOSIS — F411 Generalized anxiety disorder: Secondary | ICD-10-CM | POA: Diagnosis not present

## 2014-10-01 DIAGNOSIS — Z01812 Encounter for preprocedural laboratory examination: Secondary | ICD-10-CM | POA: Diagnosis not present

## 2014-10-01 DIAGNOSIS — K224 Dyskinesia of esophagus: Secondary | ICD-10-CM | POA: Diagnosis present

## 2014-10-01 DIAGNOSIS — K449 Diaphragmatic hernia without obstruction or gangrene: Secondary | ICD-10-CM | POA: Diagnosis not present

## 2014-10-01 DIAGNOSIS — J9811 Atelectasis: Secondary | ICD-10-CM | POA: Diagnosis not present

## 2014-10-01 DIAGNOSIS — I4891 Unspecified atrial fibrillation: Secondary | ICD-10-CM | POA: Diagnosis not present

## 2014-10-01 DIAGNOSIS — Z79899 Other long term (current) drug therapy: Secondary | ICD-10-CM | POA: Diagnosis not present

## 2014-10-01 HISTORY — DX: Angina pectoris, unspecified: I20.9

## 2014-10-01 LAB — COMPREHENSIVE METABOLIC PANEL
ALT: 15 U/L (ref 0–53)
AST: 21 U/L (ref 0–37)
Albumin: 3.8 g/dL (ref 3.5–5.2)
Alkaline Phosphatase: 75 U/L (ref 39–117)
Anion gap: 12 (ref 5–15)
BUN: 35 mg/dL — ABNORMAL HIGH (ref 6–23)
CO2: 25 mEq/L (ref 19–32)
Calcium: 9.4 mg/dL (ref 8.4–10.5)
Chloride: 106 mEq/L (ref 96–112)
Creatinine, Ser: 1.75 mg/dL — ABNORMAL HIGH (ref 0.50–1.35)
GFR calc Af Amer: 38 mL/min — ABNORMAL LOW (ref >90)
GFR calc non Af Amer: 33 mL/min — ABNORMAL LOW (ref >90)
Glucose, Bld: 107 mg/dL — ABNORMAL HIGH (ref 70–99)
Potassium: 4.6 mEq/L (ref 3.7–5.3)
Sodium: 143 mEq/L (ref 137–147)
Total Bilirubin: 0.3 mg/dL (ref 0.3–1.2)
Total Protein: 6.7 g/dL (ref 6.0–8.3)

## 2014-10-01 LAB — CBC WITH DIFFERENTIAL/PLATELET
Basophils Absolute: 0 10*3/uL (ref 0.0–0.1)
Basophils Relative: 1 % (ref 0–1)
Eosinophils Absolute: 0.1 10*3/uL (ref 0.0–0.7)
Eosinophils Relative: 1 % (ref 0–5)
HCT: 36.5 % — ABNORMAL LOW (ref 39.0–52.0)
Hemoglobin: 12.1 g/dL — ABNORMAL LOW (ref 13.0–17.0)
Lymphocytes Relative: 22 % (ref 12–46)
Lymphs Abs: 1.3 10*3/uL (ref 0.7–4.0)
MCH: 31.8 pg (ref 26.0–34.0)
MCHC: 33.2 g/dL (ref 30.0–36.0)
MCV: 95.8 fL (ref 78.0–100.0)
Monocytes Absolute: 0.6 10*3/uL (ref 0.1–1.0)
Monocytes Relative: 10 % (ref 3–12)
Neutro Abs: 3.8 10*3/uL (ref 1.7–7.7)
Neutrophils Relative %: 66 % (ref 43–77)
Platelets: 206 10*3/uL (ref 150–400)
RBC: 3.81 MIL/uL — ABNORMAL LOW (ref 4.22–5.81)
RDW: 13.1 % (ref 11.5–15.5)
WBC: 5.9 10*3/uL (ref 4.0–10.5)

## 2014-10-01 LAB — APTT: aPTT: 30 seconds (ref 24–37)

## 2014-10-01 LAB — PROTIME-INR
INR: 1.05 (ref 0.00–1.49)
Prothrombin Time: 13.9 seconds (ref 11.6–15.2)

## 2014-10-01 NOTE — Progress Notes (Signed)
CMET RESULTS ROUTED TO DR Lamount Cohen BY EPIC

## 2014-10-01 NOTE — Progress Notes (Signed)
CXR per epic 03/24/2014 EKG per epic 03/25/2014 Clearance note in epic per Dr Johnsie Cancel 09/25/2014

## 2014-10-03 ENCOUNTER — Encounter (HOSPITAL_COMMUNITY)
Admission: RE | Disposition: A | Discharge status: Skilled Nursing Facility | Payer: Self-pay | Source: Ambulatory Visit | Attending: General Surgery

## 2014-10-03 ENCOUNTER — Inpatient Hospital Stay (HOSPITAL_COMMUNITY): Payer: Medicare Other | Admitting: Certified Registered Nurse Anesthetist

## 2014-10-03 ENCOUNTER — Inpatient Hospital Stay (HOSPITAL_COMMUNITY)
Admission: RE | Admit: 2014-10-03 | Discharge: 2014-10-09 | DRG: 327 | Disposition: A | Discharge status: Skilled Nursing Facility | Payer: Medicare Other | Source: Ambulatory Visit | Attending: General Surgery | Admitting: General Surgery

## 2014-10-03 ENCOUNTER — Encounter (HOSPITAL_COMMUNITY): Payer: Self-pay | Admitting: *Deleted

## 2014-10-03 DIAGNOSIS — K224 Dyskinesia of esophagus: Secondary | ICD-10-CM | POA: Diagnosis present

## 2014-10-03 DIAGNOSIS — E440 Moderate protein-calorie malnutrition: Secondary | ICD-10-CM | POA: Diagnosis present

## 2014-10-03 DIAGNOSIS — E785 Hyperlipidemia, unspecified: Secondary | ICD-10-CM | POA: Diagnosis not present

## 2014-10-03 DIAGNOSIS — Z8249 Family history of ischemic heart disease and other diseases of the circulatory system: Secondary | ICD-10-CM | POA: Diagnosis not present

## 2014-10-03 DIAGNOSIS — Z01812 Encounter for preprocedural laboratory examination: Secondary | ICD-10-CM | POA: Diagnosis not present

## 2014-10-03 DIAGNOSIS — F419 Anxiety disorder, unspecified: Secondary | ICD-10-CM | POA: Diagnosis present

## 2014-10-03 DIAGNOSIS — J9811 Atelectasis: Secondary | ICD-10-CM | POA: Diagnosis not present

## 2014-10-03 DIAGNOSIS — E784 Other hyperlipidemia: Secondary | ICD-10-CM | POA: Diagnosis present

## 2014-10-03 DIAGNOSIS — K449 Diaphragmatic hernia without obstruction or gangrene: Principal | ICD-10-CM | POA: Diagnosis present

## 2014-10-03 DIAGNOSIS — Z9889 Other specified postprocedural states: Secondary | ICD-10-CM

## 2014-10-03 DIAGNOSIS — N183 Chronic kidney disease, stage 3 (moderate): Secondary | ICD-10-CM | POA: Diagnosis present

## 2014-10-03 DIAGNOSIS — M1990 Unspecified osteoarthritis, unspecified site: Secondary | ICD-10-CM | POA: Diagnosis present

## 2014-10-03 DIAGNOSIS — Z48815 Encounter for surgical aftercare following surgery on the digestive system: Secondary | ICD-10-CM | POA: Diagnosis not present

## 2014-10-03 DIAGNOSIS — Z79899 Other long term (current) drug therapy: Secondary | ICD-10-CM | POA: Diagnosis not present

## 2014-10-03 DIAGNOSIS — I471 Supraventricular tachycardia: Secondary | ICD-10-CM | POA: Diagnosis not present

## 2014-10-03 DIAGNOSIS — Z880 Allergy status to penicillin: Secondary | ICD-10-CM | POA: Diagnosis not present

## 2014-10-03 DIAGNOSIS — I4891 Unspecified atrial fibrillation: Secondary | ICD-10-CM | POA: Diagnosis present

## 2014-10-03 DIAGNOSIS — K440 Diaphragmatic hernia with obstruction, without gangrene: Secondary | ICD-10-CM | POA: Diagnosis present

## 2014-10-03 DIAGNOSIS — F411 Generalized anxiety disorder: Secondary | ICD-10-CM | POA: Diagnosis not present

## 2014-10-03 DIAGNOSIS — I2510 Atherosclerotic heart disease of native coronary artery without angina pectoris: Secondary | ICD-10-CM | POA: Diagnosis present

## 2014-10-03 DIAGNOSIS — K219 Gastro-esophageal reflux disease without esophagitis: Secondary | ICD-10-CM | POA: Diagnosis not present

## 2014-10-03 DIAGNOSIS — Z803 Family history of malignant neoplasm of breast: Secondary | ICD-10-CM

## 2014-10-03 DIAGNOSIS — I499 Cardiac arrhythmia, unspecified: Secondary | ICD-10-CM

## 2014-10-03 DIAGNOSIS — Z6821 Body mass index (BMI) 21.0-21.9, adult: Secondary | ICD-10-CM | POA: Diagnosis not present

## 2014-10-03 DIAGNOSIS — M6281 Muscle weakness (generalized): Secondary | ICD-10-CM | POA: Diagnosis not present

## 2014-10-03 DIAGNOSIS — I129 Hypertensive chronic kidney disease with stage 1 through stage 4 chronic kidney disease, or unspecified chronic kidney disease: Secondary | ICD-10-CM | POA: Diagnosis present

## 2014-10-03 DIAGNOSIS — N400 Enlarged prostate without lower urinary tract symptoms: Secondary | ICD-10-CM | POA: Diagnosis not present

## 2014-10-03 DIAGNOSIS — J90 Pleural effusion, not elsewhere classified: Secondary | ICD-10-CM | POA: Diagnosis not present

## 2014-10-03 DIAGNOSIS — R0602 Shortness of breath: Secondary | ICD-10-CM

## 2014-10-03 DIAGNOSIS — Z934 Other artificial openings of gastrointestinal tract status: Secondary | ICD-10-CM

## 2014-10-03 DIAGNOSIS — Z8719 Personal history of other diseases of the digestive system: Secondary | ICD-10-CM

## 2014-10-03 DIAGNOSIS — Z931 Gastrostomy status: Secondary | ICD-10-CM | POA: Diagnosis not present

## 2014-10-03 HISTORY — PX: GASTROSTOMY: SHX5249

## 2014-10-03 HISTORY — PX: HIATAL HERNIA REPAIR: SHX195

## 2014-10-03 LAB — BASIC METABOLIC PANEL
Anion gap: 12 (ref 5–15)
BUN: 32 mg/dL — ABNORMAL HIGH (ref 6–23)
CO2: 21 mEq/L (ref 19–32)
Calcium: 8.4 mg/dL (ref 8.4–10.5)
Chloride: 107 mEq/L (ref 96–112)
Creatinine, Ser: 1.62 mg/dL — ABNORMAL HIGH (ref 0.50–1.35)
GFR calc Af Amer: 41 mL/min — ABNORMAL LOW (ref >90)
GFR calc non Af Amer: 36 mL/min — ABNORMAL LOW (ref >90)
Glucose, Bld: 195 mg/dL — ABNORMAL HIGH (ref 70–99)
Potassium: 4.5 mEq/L (ref 3.7–5.3)
Sodium: 140 mEq/L (ref 137–147)

## 2014-10-03 LAB — CBC
HCT: 33 % — ABNORMAL LOW (ref 39.0–52.0)
Hemoglobin: 10.8 g/dL — ABNORMAL LOW (ref 13.0–17.0)
MCH: 31.8 pg (ref 26.0–34.0)
MCHC: 32.7 g/dL (ref 30.0–36.0)
MCV: 97.1 fL (ref 78.0–100.0)
Platelets: 158 10*3/uL (ref 150–400)
RBC: 3.4 MIL/uL — ABNORMAL LOW (ref 4.22–5.81)
RDW: 13.1 % (ref 11.5–15.5)
WBC: 14.4 10*3/uL — ABNORMAL HIGH (ref 4.0–10.5)

## 2014-10-03 SURGERY — REPAIR, HERNIA, HIATAL, LAPAROSCOPIC
Anesthesia: General | Site: Abdomen

## 2014-10-03 MED ORDER — PROMETHAZINE HCL 25 MG/ML IJ SOLN: 12.5000 mg | INTRAMUSCULAR | Status: DC | PRN

## 2014-10-03 MED ORDER — BUPIVACAINE-EPINEPHRINE 0.5% -1:200000 IJ SOLN
INTRAMUSCULAR | Status: AC
  Filled 2014-10-03: qty 1

## 2014-10-03 MED ORDER — PHENYLEPHRINE HCL 10 MG/ML IJ SOLN
INTRAMUSCULAR | Status: DC | PRN
  Administered 2014-10-03: 120 ug via INTRAVENOUS

## 2014-10-03 MED ORDER — NEOSTIGMINE METHYLSULFATE 10 MG/10ML IV SOLN
INTRAVENOUS | Status: DC | PRN
  Administered 2014-10-03: 3 mg via INTRAVENOUS

## 2014-10-03 MED ORDER — METOCLOPRAMIDE HCL 5 MG/ML IJ SOLN
INTRAMUSCULAR | Status: AC
  Filled 2014-10-03: qty 2

## 2014-10-03 MED ORDER — CHLORHEXIDINE GLUCONATE 4 % EX LIQD: 1.0000 "application " | Freq: Once | CUTANEOUS | Status: DC

## 2014-10-03 MED ORDER — CEFAZOLIN SODIUM-DEXTROSE 2-3 GM-% IV SOLR
2.0000 g | INTRAVENOUS | Status: AC
  Administered 2014-10-03: 2 g via INTRAVENOUS

## 2014-10-03 MED ORDER — HYDROMORPHONE HCL 1 MG/ML IJ SOLN
0.2500 mg | INTRAMUSCULAR | Status: DC | PRN
  Administered 2014-10-03 (×3): 0.5 mg via INTRAVENOUS

## 2014-10-03 MED ORDER — SODIUM CHLORIDE 0.9 % IJ SOLN
INTRAMUSCULAR | Status: AC
  Filled 2014-10-03: qty 10

## 2014-10-03 MED ORDER — SUCCINYLCHOLINE CHLORIDE 20 MG/ML IJ SOLN
INTRAMUSCULAR | Status: DC | PRN
  Administered 2014-10-03: 100 mg via INTRAVENOUS

## 2014-10-03 MED ORDER — SODIUM CHLORIDE 0.9 % IV SOLN
INTRAVENOUS | Status: DC | PRN
  Administered 2014-10-03 (×2): via INTRAVENOUS

## 2014-10-03 MED ORDER — FENTANYL CITRATE 0.05 MG/ML IJ SOLN
INTRAMUSCULAR | Status: DC | PRN
  Administered 2014-10-03 (×5): 50 ug via INTRAVENOUS

## 2014-10-03 MED ORDER — PANTOPRAZOLE SODIUM 40 MG IV SOLR
40.0000 mg | Freq: Two times a day (BID) | INTRAVENOUS | Status: DC
  Administered 2014-10-03 – 2014-10-06 (×7): 40 mg via INTRAVENOUS
  Filled 2014-10-03 (×9): qty 40

## 2014-10-03 MED ORDER — CEFAZOLIN SODIUM-DEXTROSE 2-3 GM-% IV SOLR
INTRAVENOUS | Status: AC
  Filled 2014-10-03: qty 50

## 2014-10-03 MED ORDER — GLYCOPYRROLATE 0.2 MG/ML IJ SOLN
INTRAMUSCULAR | Status: AC
  Filled 2014-10-03: qty 2

## 2014-10-03 MED ORDER — LACTATED RINGERS IR SOLN
Status: DC | PRN
  Administered 2014-10-03: 1000 mL

## 2014-10-03 MED ORDER — FENTANYL CITRATE 0.05 MG/ML IJ SOLN
INTRAMUSCULAR | Status: AC
Start: 2014-10-03 — End: 2014-10-03
  Filled 2014-10-03: qty 5

## 2014-10-03 MED ORDER — EPHEDRINE SULFATE 50 MG/ML IJ SOLN
INTRAMUSCULAR | Status: AC
  Filled 2014-10-03: qty 1

## 2014-10-03 MED ORDER — PHENYLEPHRINE 40 MCG/ML (10ML) SYRINGE FOR IV PUSH (FOR BLOOD PRESSURE SUPPORT)
PREFILLED_SYRINGE | INTRAVENOUS | Status: AC
  Filled 2014-10-03: qty 10

## 2014-10-03 MED ORDER — HEPARIN SODIUM (PORCINE) 5000 UNIT/ML IJ SOLN
5000.0000 [IU] | Freq: Three times a day (TID) | INTRAMUSCULAR | Status: DC
  Administered 2014-10-04 – 2014-10-09 (×16): 5000 [IU] via SUBCUTANEOUS
  Filled 2014-10-03 (×22): qty 1

## 2014-10-03 MED ORDER — DEXAMETHASONE SODIUM PHOSPHATE 10 MG/ML IJ SOLN
INTRAMUSCULAR | Status: AC
  Filled 2014-10-03: qty 1

## 2014-10-03 MED ORDER — CISATRACURIUM BESYLATE (PF) 10 MG/5ML IV SOLN
INTRAVENOUS | Status: DC | PRN
  Administered 2014-10-03: 1 mg via INTRAVENOUS
  Administered 2014-10-03: 3 mg via INTRAVENOUS
  Administered 2014-10-03: 8 mg via INTRAVENOUS

## 2014-10-03 MED ORDER — DEXAMETHASONE SODIUM PHOSPHATE 10 MG/ML IJ SOLN
INTRAMUSCULAR | Status: DC | PRN
  Administered 2014-10-03: 10 mg via INTRAVENOUS

## 2014-10-03 MED ORDER — HYDROMORPHONE HCL 1 MG/ML IJ SOLN
INTRAMUSCULAR | Status: AC
  Filled 2014-10-03: qty 1

## 2014-10-03 MED ORDER — POTASSIUM CHLORIDE IN NACL 20-0.9 MEQ/L-% IV SOLN
INTRAVENOUS | Status: DC
  Administered 2014-10-03 – 2014-10-04 (×2): via INTRAVENOUS
  Filled 2014-10-03 (×2): qty 1000

## 2014-10-03 MED ORDER — GLYCOPYRROLATE 0.2 MG/ML IJ SOLN
INTRAMUSCULAR | Status: DC | PRN
  Administered 2014-10-03: 0.4 mg via INTRAVENOUS
  Administered 2014-10-03: 0.2 mg via INTRAVENOUS

## 2014-10-03 MED ORDER — ONDANSETRON HCL 4 MG/2ML IJ SOLN
INTRAMUSCULAR | Status: DC | PRN
  Administered 2014-10-03: 4 mg via INTRAVENOUS

## 2014-10-03 MED ORDER — ETOMIDATE 2 MG/ML IV SOLN
INTRAVENOUS | Status: AC
  Filled 2014-10-03: qty 10

## 2014-10-03 MED ORDER — GLYCOPYRROLATE 0.2 MG/ML IJ SOLN
INTRAMUSCULAR | Status: AC
  Filled 2014-10-03: qty 1

## 2014-10-03 MED ORDER — ETOMIDATE 2 MG/ML IV SOLN
INTRAVENOUS | Status: DC | PRN
  Administered 2014-10-03: 10 mg via INTRAVENOUS

## 2014-10-03 MED ORDER — ONDANSETRON HCL 4 MG PO TABS: 4.0000 mg | ORAL_TABLET | Freq: Four times a day (QID) | ORAL | Status: DC | PRN

## 2014-10-03 MED ORDER — ONDANSETRON HCL 4 MG/2ML IJ SOLN: 4.0000 mg | Freq: Four times a day (QID) | INTRAMUSCULAR | Status: DC | PRN

## 2014-10-03 MED ORDER — EPHEDRINE SULFATE 50 MG/ML IJ SOLN
INTRAMUSCULAR | Status: DC | PRN
  Administered 2014-10-03: 5 mg via INTRAVENOUS
  Administered 2014-10-03: 10 mg via INTRAVENOUS

## 2014-10-03 MED ORDER — LORAZEPAM 2 MG/ML IJ SOLN: 0.5000 mg | INTRAMUSCULAR | Status: DC | PRN

## 2014-10-03 MED ORDER — METOCLOPRAMIDE HCL 5 MG/ML IJ SOLN
INTRAMUSCULAR | Status: DC | PRN
  Administered 2014-10-03: 10 mg via INTRAVENOUS

## 2014-10-03 MED ORDER — FENTANYL CITRATE 0.05 MG/ML IJ SOLN
12.5000 ug | INTRAMUSCULAR | Status: DC | PRN
  Administered 2014-10-03: 25 ug via INTRAVENOUS
  Administered 2014-10-04 (×3): 12.5 ug via INTRAVENOUS
  Administered 2014-10-04 (×4): 25 ug via INTRAVENOUS
  Administered 2014-10-05: 12.5 ug via INTRAVENOUS
  Filled 2014-10-03 (×9): qty 2

## 2014-10-03 MED ORDER — ONDANSETRON HCL 4 MG/2ML IJ SOLN
INTRAMUSCULAR | Status: AC
  Filled 2014-10-03: qty 2

## 2014-10-03 MED ORDER — LACTATED RINGERS IV SOLN
INTRAVENOUS | Status: DC
Start: 2014-10-03 — End: 2014-10-03

## 2014-10-03 MED ORDER — BUPIVACAINE-EPINEPHRINE 0.5% -1:200000 IJ SOLN
INTRAMUSCULAR | Status: DC | PRN
  Administered 2014-10-03: 20 mL

## 2014-10-03 MED ORDER — SODIUM CHLORIDE 0.9 % IV SOLN
INTRAVENOUS | Status: DC
  Administered 2014-10-03: 12:00:00 via INTRAVENOUS

## 2014-10-03 MED ORDER — CARBAMIDE PEROXIDE 6.5 % OT SOLN: 5.0000 [drp] | OTIC | Status: DC

## 2014-10-03 MED ORDER — CISATRACURIUM BESYLATE 20 MG/10ML IV SOLN
INTRAVENOUS | Status: AC
  Filled 2014-10-03: qty 10

## 2014-10-03 MED ORDER — METOPROLOL TARTRATE 1 MG/ML IV SOLN
2.5000 mg | Freq: Four times a day (QID) | INTRAVENOUS | Status: DC
  Administered 2014-10-04 (×2): 2.5 mg via INTRAVENOUS
  Filled 2014-10-03 (×3): qty 5

## 2014-10-03 MED ORDER — PROPOFOL 10 MG/ML IV BOLUS
INTRAVENOUS | Status: AC
  Filled 2014-10-03: qty 20

## 2014-10-03 MED ORDER — PROPOFOL 10 MG/ML IV BOLUS
INTRAVENOUS | Status: DC | PRN
  Administered 2014-10-03: 100 mg via INTRAVENOUS

## 2014-10-03 SURGICAL SUPPLY — 67 items
ADH SKN CLS APL DERMABOND .7 (GAUZE/BANDAGES/DRESSINGS) ×1
APL SKNCLS STERI-STRIP NONHPOA (GAUZE/BANDAGES/DRESSINGS) ×1
APPLIER CLIP ROT 10 11.4 M/L (STAPLE)
APR CLP MED LRG 11.4X10 (STAPLE)
BENZOIN TINCTURE PRP APPL 2/3 (GAUZE/BANDAGES/DRESSINGS) ×2 IMPLANT
CANISTER SUCTION 2500CC (MISCELLANEOUS) IMPLANT
CATH FOLEY 2WAY SLVR 30CC 22FR (CATHETERS) ×1 IMPLANT
CHLORAPREP W/TINT 26ML (MISCELLANEOUS) ×2 IMPLANT
CLAMP ENDO BABCK 10MM (STAPLE) IMPLANT
CLIP APPLIE ROT 10 11.4 M/L (STAPLE) IMPLANT
CLIP SUT LAPRA TY ABSORB (SUTURE) ×1 IMPLANT
DECANTER SPIKE VIAL GLASS SM (MISCELLANEOUS) ×2 IMPLANT
DERMABOND ADVANCED (GAUZE/BANDAGES/DRESSINGS) ×1
DERMABOND ADVANCED .7 DNX12 (GAUZE/BANDAGES/DRESSINGS) IMPLANT
DEVICE SUT QUICK LOAD TK 5 (STAPLE) ×8 IMPLANT
DEVICE SUT TI-KNOT TK 5X26 (MISCELLANEOUS) ×2 IMPLANT
DEVICE SUTURE ENDOST 10MM (ENDOMECHANICALS) ×2 IMPLANT
DEVICE TROCAR PUNCTURE CLOSURE (ENDOMECHANICALS) ×2 IMPLANT
DISSECTOR BLUNT TIP ENDO 5MM (MISCELLANEOUS) ×2 IMPLANT
DRAIN CHANNEL 19F RND (DRAIN) IMPLANT
DRAIN PENROSE 18X1/2 LTX STRL (DRAIN) ×2 IMPLANT
DRAPE LAPAROSCOPIC ABDOMINAL (DRAPES) ×2 IMPLANT
DRAPE POUCH INSTRU U-SHP 10X18 (DRAPES) ×2 IMPLANT
ELECT REM PT RETURN 9FT ADLT (ELECTROSURGICAL) ×2
ELECTRODE REM PT RTRN 9FT ADLT (ELECTROSURGICAL) ×1 IMPLANT
EVACUATOR 1/8 PVC DRAIN (DRAIN) IMPLANT
FELT TEFLON 4 X1 (Mesh General) ×1 IMPLANT
GLOVE BIOGEL PI IND STRL 7.0 (GLOVE) ×1 IMPLANT
GLOVE BIOGEL PI INDICATOR 7.0 (GLOVE) ×1
GLOVE EUDERMIC 7 POWDERFREE (GLOVE) ×3 IMPLANT
GOWN STRL REUS W/TWL LRG LVL3 (GOWN DISPOSABLE) ×3 IMPLANT
GOWN STRL REUS W/TWL XL LVL3 (GOWN DISPOSABLE) ×4 IMPLANT
GRASPER ENDO BABCOCK 10 (MISCELLANEOUS) IMPLANT
GRASPER ENDO BABCOCK 10MM (MISCELLANEOUS)
KIT BASIN OR (CUSTOM PROCEDURE TRAY) ×2 IMPLANT
NS IRRIG 1000ML POUR BTL (IV SOLUTION) ×2 IMPLANT
PENCIL BUTTON HOLSTER BLD 10FT (ELECTRODE) IMPLANT
PLUG CATH AND CAP STER (CATHETERS) ×1 IMPLANT
RETRACTOR LAPSCP 12X46 CVD (ENDOMECHANICALS) IMPLANT
RTRCTR LAPSCP 12X46 CVD (ENDOMECHANICALS)
SCISSORS LAP 5X45 EPIX DISP (ENDOMECHANICALS) ×1 IMPLANT
SET IRRIG TUBING LAPAROSCOPIC (IRRIGATION / IRRIGATOR) ×2 IMPLANT
SHEARS HARMONIC ACE PLUS 36CM (ENDOMECHANICALS) ×2 IMPLANT
SLEEVE XCEL OPT CAN 5 100 (ENDOMECHANICALS) ×2 IMPLANT
SOLUTION ANTI FOG 6CC (MISCELLANEOUS) ×1 IMPLANT
SPONGE DRAIN TRACH 4X4 STRL 2S (GAUZE/BANDAGES/DRESSINGS) ×1 IMPLANT
STAPLER VISISTAT 35W (STAPLE) IMPLANT
STRIP CLOSURE SKIN 1/2X4 (GAUZE/BANDAGES/DRESSINGS) IMPLANT
SUT ETHILON 2 0 PS N (SUTURE) IMPLANT
SUT MNCRL AB 4-0 PS2 18 (SUTURE) ×2 IMPLANT
SUT PROLENE 2 0 SH DA (SUTURE) ×4 IMPLANT
SUT SILK 0 SH 30 (SUTURE) ×1 IMPLANT
SUT SILK 2 0 SH (SUTURE) ×1 IMPLANT
SUT SURGIDAC NAB ES-9 0 48 120 (SUTURE) ×8 IMPLANT
SYR BULB IRRIGATION 50ML (SYRINGE) ×1 IMPLANT
TIP INNERVISION DETACH 40FR (MISCELLANEOUS) IMPLANT
TIP INNERVISION DETACH 50FR (MISCELLANEOUS) IMPLANT
TIP INNERVISION DETACH 56FR (MISCELLANEOUS) IMPLANT
TIPS INNERVISION DETACH 40FR (MISCELLANEOUS)
TOWEL OR 17X26 10 PK STRL BLUE (TOWEL DISPOSABLE) ×2 IMPLANT
TRAY FOLEY CATH 14FRSI W/METER (CATHETERS) ×2 IMPLANT
TRAY LAPAROSCOPIC (CUSTOM PROCEDURE TRAY) ×2 IMPLANT
TROCAR BLADELESS OPT 5 100 (ENDOMECHANICALS) ×2 IMPLANT
TROCAR XCEL BLUNT TIP 100MML (ENDOMECHANICALS) IMPLANT
TROCAR XCEL NON-BLD 11X100MML (ENDOMECHANICALS) ×2 IMPLANT
TROCAR XCEL UNIV SLVE 11M 100M (ENDOMECHANICALS) ×2 IMPLANT
TUBING INSUFFLATION 10FT LAP (TUBING) ×2 IMPLANT

## 2014-10-03 NOTE — Anesthesia Postprocedure Evaluation (Signed)
  Anesthesia Post-op Note  Patient: Larry Mcfarland  Procedure(s) Performed: Procedure(s) (LRB): LAPAROSCOPIC REPAIR GIANT HIATAL HERNIA, POSSIBLE OPEN (N/A) GASTROSTOMY TUBE (N/A)  Patient Location: PACU  Anesthesia Type: General  Level of Consciousness: awake and alert   Airway and Oxygen Therapy: Patient Spontanous Breathing  Post-op Pain: mild  Post-op Assessment: Post-op Vital signs reviewed, Patient's Cardiovascular Status Stable, Respiratory Function Stable, Patent Airway and No signs of Nausea or vomiting  Last Vitals:  Filed Vitals:   10/03/14 1226  BP: 148/78  Pulse: 78  Temp:   Resp: 16    Post-op Vital Signs: stable   Complications: No apparent anesthesia complications

## 2014-10-03 NOTE — Op Note (Signed)
Patient Name:           Larry Mcfarland   Date of Surgery:        10/03/2014  Pre op Diagnosis:      Large, type III hiatal hernia Esophageal motility disorder Moderate protein calorie malnutrition  Post op Diagnosis:    same  Procedure:                 Laparoscopic reduction of complex, type III hiatal hernia, diaphragmatic closure, laparoscopic gastrostomy tube  Surgeon:                     Edsel Petrin. Dalbert Batman, M.D., FACS  Assistant:                      Adonis Housekeeper, M.D.  Operative Indications:   The patient is a 80 year old male who  returns and wants to have his hiatal hernia repair. He's had at least 2 more attacks one of which was worse than the others. He tried to vomit but could not. The pain resolved after about 5 hours. He has felt fine in between. He state trhat he cannot continue to live like this.  Able to swallow and having normal bowel movements. .. Recall that he has been having obstructive symptoms from his giant hiatal hernia. Upper GI showed large type III hiatal hernia but no obstruction. Ultrasound showed a normal gallbladder. EGD showed very large hiatal hernia containing entire stomach except for distal gastric antrum otherwise normal. Healthy mucosa. First and second duodenum was normal. He continues to play golf and is otherwise active. Comorbidities include hypertension. Chronic kidney disease stage III, anxiety, BPH, remote history CAD with cardiac cath by Dr. Daneen Schick in the past, hypertension, single episode of atrial fibrillation in past. Functional status is actually fairly good independent.  We will proceed with planning this surgery. He knows he is high risk.he has undergone cardiac evaluation risk assessment preop.  He does not have reflux symptoms. Because of this and because I suspect his motility is poor, I advised against anti-reflux surgery.  Operative Findings:       Most of the stomach was up in the chest behind the heart. Just the very distal  gastric antrum was visible. We were able to reduce about two thirds of the stomach immediately, but we had to do an extensive dissection of the hernia sac to get  complete reduction of the stomach into the abdomen. We probably had 4 or 5 cm of intra-abdominal esophagus. We  closed the diaphragm with 3 pledgeted sutures of #1 Surgidek,  That we did not close the diaphragm very tightly because the diaphragm and crura were thin and under a little bit of tension. Laparoscopic gastrostomy tube with 22 French Foley with 30 mL balloon was placed uneventfully.  Procedure in Detail:          Following the induction of general endotracheal anesthesia a Foley catheter was placed. This was uneventful. The abdomen was prepped and draped in a sterile fashion. Intravenous antibiotics were given. Surgical timeout was performed. 0.5% Marcaine with epinephrine was used at all trocar sites.      An 11 mm Hassan trocar was placed in the midline just below the umbilicus with an open technique. Entry was uneventful and the trocar was secured with the Pursestring suture of 0 Vicryl. Pneumoperitoneum was created. The abdomen and pelvis were visualized with findings as described above. Two 5 mm  trochars were placed in the left upper quadrant. A 5 mm trocar and an 11 mm trocar placed in the right upper quadrant. Nathanson retractor was placed through a puncture wound in the subxiphoid area area and this was used to retract the left lobe of the liver and gave [place] good exposure.     We slowly reduced the stomach as much as possible. We got more than half of the stomach reduced and. using the harmonic scalpel we then incised the hernia sac at the apex of the crura and down the right and left crura. We spent a long time dissecting the hernia sac away out of the mediastinum. We could ultimately visualize the esophagus. The anterior vagus nerve might have been divided. The posterior vagus nerve was preserved. We took down all the short  gastrics. We had a little bit of bleeding, but not much. We slowly mobilized the fundus and cardia away from the left crus. We went over to the right side and then made sure we completed the dissection of the stomach off of the right crus. A posterior window was created without difficulty, and a Penrose drain through a window below the diaphragmatic crura  and this helped [place] with the final completion of the hernia sac dissection. We could see that we had a adequate amount of intra-abdominal esophagus. It did not appear to be any injury. There was no bleeding. The diaphragmatic crura were closed posteriorly with 3 interrupted pledgeted sutures of #1 Surgidek. I felt this was all that needs to be done.     We then proceeded to perform a laparoscopic gastrostomy tube. We selected a suitable site on the anterior wall of the proximal antrum and found that this would come up to the anterior abdominal wall without difficulty. I marked the area of the intended gastrostomy tube site with electrocautery. I placed a pursestring suture of 2-0 silk around the intended gastrotomy site and left this loose. I then placed stay sutures on the left upper and left lower regions of the stomach outside the Pursestring with 2-0 Prolene and brought these up through the abdominal wall with an Endo Close device. I then used a harmonic scalpel and made a gastrotomy tube. A 22 French Foley with 30 mL balloon was passed through the left upper quadrant abdominal wall, through one of the trocar sites. This was inserted into the stomach and the balloon was inflated without difficulty. Catheter flushed easily and aspirated easily. The pursestring suture was cinched up and closed with a Lapra-Ty. The 2 stay sutures of Prolene were then lifted up and tied. I placed 2 or 3 more sutures of 2-0 Prolene to further secure the gastric seromuscular layer to the anterior abdominal wall. I put 20 mL of saline in the balloon and pulled it up and then sutured  the catheter to the scan with a 0 silk tie. The gastrostomy tube was ultimately connected to a gravity bag.      I went back and inspected the diaphragmatic closure, the esophagus, and the stomach and found no evidence of bleeding or injury. Pneumoperitoneum was released and trocars were removed. The skin incisions were closed with subcuticular sutures of 4-0 Monocryl and Dermabond. The patient tolerated procedure well was taken to PACU in stable condition. EBL 30 mL. Counts correct. Complications none.         Edsel Petrin. Dalbert Batman, M.D., FACS General and Minimally Invasive Surgery Breast and Colorectal Surgery  10/03/2014 11:16 AM

## 2014-10-03 NOTE — Interval H&P Note (Signed)
History and Physical Interval Note:  10/03/2014 7:41 AM  Larry Mcfarland  has presented today for surgery, with the diagnosis of giant hiatal hernia  The goals and the various methods of treatment have been discussed with the patient and family. After consideration of risks, benefits and other options for treatment, the patient has consented to  Procedure(s): [place], POSSIBLE OPEN (N/A) GASTROSTOMY TUBE (N/A) as a surgical intervention .  The patient's history has been reviewed, patient examined today , no change in status, stable for surgery. He knows he is high risk due to advanced age and magnitude of surgery. I have reviewed the patient's chart and labs.  Questions were answered to the patient's satisfaction.     Larry Mcfarland

## 2014-10-03 NOTE — Anesthesia Preprocedure Evaluation (Addendum)
Anesthesia Evaluation  Patient identified by MRN, date of birth, ID band Patient awake    Reviewed: Allergy & Precautions, H&P , NPO status , Patient's Chart, lab work & pertinent test results, reviewed documented beta blocker date and time   Airway Mallampati: II  TM Distance: >3 FB Neck ROM: full    Dental no notable dental hx. (+) Dental Advisory Given, Teeth Intact   Pulmonary neg pulmonary ROS,  breath sounds clear to auscultation  Pulmonary exam normal       Cardiovascular Exercise Tolerance: Good hypertension, Pt. on home beta blockers and Pt. on medications + CAD negative cardio ROS  + dysrhythmias Atrial Fibrillation Rhythm:regular Rate:Normal     Neuro/Psych vertigo negative neurological ROS  negative psych ROS   GI/Hepatic negative GI ROS, Neg liver ROS, hiatal hernia, GERD-  Medicated and Controlled,  Endo/Other  negative endocrine ROS  Renal/GU Renal diseasenegative Renal ROSStage 3 chronic kidney disease  negative genitourinary   Musculoskeletal   Abdominal   Peds  Hematology negative hematology ROS (+)   Anesthesia Other Findings   Reproductive/Obstetrics negative OB ROS                           Anesthesia Physical Anesthesia Plan  ASA: III  Anesthesia Plan: General   Post-op Pain Management:    Induction: Intravenous  Airway Management Planned: Oral ETT  Additional Equipment:   Intra-op Plan:   Post-operative Plan: Extubation in OR  Informed Consent: I have reviewed the patients History and Physical, chart, labs and discussed the procedure including the risks, benefits and alternatives for the proposed anesthesia with the patient or authorized representative who has indicated his/her understanding and acceptance.   Dental Advisory Given  Plan Discussed with: CRNA and Surgeon  Anesthesia Plan Comments:         Anesthesia Quick Evaluation

## 2014-10-03 NOTE — Transfer of Care (Signed)
Immediate Anesthesia Transfer of Care Note  Patient: Larry Mcfarland  Procedure(s) Performed: Procedure(s): LAPAROSCOPIC REPAIR GIANT HIATAL HERNIA, POSSIBLE OPEN (N/A) GASTROSTOMY TUBE (N/A)  Patient Location: PACU  Anesthesia Type:General  Level of Consciousness: awake, sedated, patient cooperative and confused  Airway & Oxygen Therapy: Patient Spontanous Breathing and Patient connected to face mask oxygen  Post-op Assessment: Report given to PACU RN and Post -op Vital signs reviewed and stable  Post vital signs: Reviewed and stable  Complications: No apparent anesthesia complications

## 2014-10-04 ENCOUNTER — Inpatient Hospital Stay (HOSPITAL_COMMUNITY): Payer: Medicare Other

## 2014-10-04 ENCOUNTER — Encounter (HOSPITAL_COMMUNITY): Payer: Self-pay | Admitting: General Surgery

## 2014-10-04 LAB — CBC
HCT: 33.6 % — ABNORMAL LOW (ref 39.0–52.0)
Hemoglobin: 11.3 g/dL — ABNORMAL LOW (ref 13.0–17.0)
MCH: 32 pg (ref 26.0–34.0)
MCHC: 33.6 g/dL (ref 30.0–36.0)
MCV: 95.2 fL (ref 78.0–100.0)
Platelets: 180 10*3/uL (ref 150–400)
RBC: 3.53 MIL/uL — ABNORMAL LOW (ref 4.22–5.81)
RDW: 13 % (ref 11.5–15.5)
WBC: 14 10*3/uL — ABNORMAL HIGH (ref 4.0–10.5)

## 2014-10-04 LAB — BASIC METABOLIC PANEL
Anion gap: 9 (ref 5–15)
BUN: 29 mg/dL — ABNORMAL HIGH (ref 6–23)
CO2: 22 mEq/L (ref 19–32)
Calcium: 8.9 mg/dL (ref 8.4–10.5)
Chloride: 108 mEq/L (ref 96–112)
Creatinine, Ser: 1.5 mg/dL — ABNORMAL HIGH (ref 0.50–1.35)
GFR calc Af Amer: 45 mL/min — ABNORMAL LOW (ref >90)
GFR calc non Af Amer: 39 mL/min — ABNORMAL LOW (ref >90)
Glucose, Bld: 147 mg/dL — ABNORMAL HIGH (ref 70–99)
Potassium: 4.9 mEq/L (ref 3.7–5.3)
Sodium: 139 mEq/L (ref 137–147)

## 2014-10-04 MED ORDER — METOPROLOL TARTRATE 12.5 MG HALF TABLET
12.5000 mg | ORAL_TABLET | Freq: Two times a day (BID) | ORAL | Status: DC
  Administered 2014-10-04 – 2014-10-05 (×4): 12.5 mg via ORAL
  Filled 2014-10-04 (×5): qty 1

## 2014-10-04 MED ORDER — ALPRAZOLAM 0.25 MG PO TABS
0.1250 mg | ORAL_TABLET | Freq: Every evening | ORAL | Status: DC | PRN
  Administered 2014-10-04: 0.125 mg via ORAL
  Filled 2014-10-04: qty 1

## 2014-10-04 MED ORDER — TAMSULOSIN HCL 0.4 MG PO CAPS
0.4000 mg | ORAL_CAPSULE | Freq: Every day | ORAL | Status: DC
  Administered 2014-10-04 – 2014-10-08 (×5): 0.4 mg via ORAL
  Filled 2014-10-04 (×7): qty 1

## 2014-10-04 MED ORDER — PRAVASTATIN SODIUM 20 MG PO TABS
20.0000 mg | ORAL_TABLET | Freq: Every day | ORAL | Status: DC
  Administered 2014-10-04 – 2014-10-08 (×5): 20 mg via ORAL
  Filled 2014-10-04 (×7): qty 1

## 2014-10-04 MED ORDER — LISINOPRIL 10 MG PO TABS
10.0000 mg | ORAL_TABLET | Freq: Two times a day (BID) | ORAL | Status: DC
  Administered 2014-10-04 – 2014-10-09 (×11): 10 mg via ORAL
  Filled 2014-10-04 (×13): qty 1

## 2014-10-04 MED ORDER — SODIUM CHLORIDE 0.9 % IV SOLN
INTRAVENOUS | Status: DC
  Administered 2014-10-04 – 2014-10-05 (×2): via INTRAVENOUS

## 2014-10-04 MED ORDER — IOHEXOL 300 MG/ML  SOLN
100.0000 mL | Freq: Once | INTRAMUSCULAR | Status: AC | PRN
  Administered 2014-10-04: 100 mL via ORAL

## 2014-10-04 MED ORDER — SODIUM CHLORIDE 0.9 % IV SOLN
INTRAVENOUS | Status: DC
  Filled 2014-10-04: qty 1000

## 2014-10-04 MED ORDER — AMLODIPINE BESYLATE 2.5 MG PO TABS
2.5000 mg | ORAL_TABLET | Freq: Every day | ORAL | Status: DC
  Administered 2014-10-04 – 2014-10-09 (×6): 2.5 mg via ORAL
  Filled 2014-10-04 (×6): qty 1

## 2014-10-04 MED ORDER — CETYLPYRIDINIUM CHLORIDE 0.05 % MT LIQD
7.0000 mL | Freq: Two times a day (BID) | OROMUCOSAL | Status: DC
  Administered 2014-10-04 – 2014-10-09 (×9): 7 mL via OROMUCOSAL

## 2014-10-04 NOTE — Evaluation (Signed)
Physical Therapy Evaluation Patient Details Name: Larry Mcfarland MRN: [id number] DOB: [birth year] Today's Date: 10/04/2014   History of Present Illness  Pt is a 80 year old male s/p lap repair giant hiatal hernia and gastrostomy tube on 10/03/14  Clinical Impression  Pt currently with functional limitations due to the deficits listed below (see PT Problem List).  Pt will benefit from skilled PT to increase their independence and safety with mobility to allow discharge to the venue listed below.  Pt reporting weakness during ambulation and plans to d/c to SNF for further rehab prior to home alone.     Follow Up Recommendations SNF    Equipment Recommendations  None recommended by PT    Recommendations for Other Services       Precautions / Restrictions Precautions Precautions: Fall Precaution Comments: g-tube      Mobility  Bed Mobility Overal bed mobility: Needs Assistance Bed Mobility: Sidelying to Sit;Rolling Rolling: Max assist Sidelying to sit: Mod assist;+2 for safety/equipment       General bed mobility comments: pt with increased pain attempting supine to sit so verbal cues and assist for rolling with bed pad then assist for trunk upright  Transfers Overall transfer level: Needs assistance Equipment used: Rolling walker (2 wheeled) Transfers: Sit to/from Stand Sit to Stand: +2 safety/equipment;Min assist         General transfer comment: verbal cues for safe technique  Ambulation/Gait Ambulation/Gait assistance: Min assist;+2 safety/equipment Ambulation Distance (Feet): 120 Feet Assistive device: Rolling walker (2 wheeled) Gait Pattern/deviations: Step-through pattern;Trunk flexed;Decreased stride length     General Gait Details: verbal cues for use of RW, pt reports only "a little" pain, ambulation distance to pt tolerance  Stairs            Wheelchair Mobility    Modified Rankin (Stroke Patients Only)       Balance                                             Pertinent Vitals/Pain Pain Assessment: Faces Faces Pain Scale: Hurts even more Pain Location: pain not rated however in abdomen mostly with position changes Pain Descriptors / Indicators: Operative site guarding;Grimacing;Discomfort Pain Intervention(s): Monitored during session;Limited activity within patient's tolerance;Repositioned    Home Living Family/patient expects to be discharged to:: Skilled nursing facility Living Arrangements: Alone                    Prior Function Level of Independence: Independent         Comments: has RW but does not use assistive device, reports ambulating 1 mile at least 5 days/week     Hand Dominance        Extremity/Trunk Assessment               Lower Extremity Assessment: Generalized weakness         Communication   Communication: HOH  Cognition Arousal/Alertness: Awake/alert Behavior During Therapy: WFL for tasks assessed/performed Overall Cognitive Status: Within Functional Limits for tasks assessed                      General Comments      Exercises        Assessment/Plan    PT Assessment Patient needs continued PT services  PT Diagnosis Difficulty walking   PT Problem List Decreased strength;Decreased activity tolerance;Decreased  mobility;Decreased knowledge of use of DME;Pain  PT Treatment Interventions DME instruction;Gait training;Therapeutic activities;Therapeutic exercise;Functional mobility training;Patient/family education   PT Goals (Current goals can be found in the Care Plan section) Acute Rehab PT Goals PT Goal Formulation: With patient/family Time For Goal Achievement: 10/11/14 Potential to Achieve Goals: Good    Frequency Min 3X/week   Barriers to discharge        Co-evaluation               End of Session   Activity Tolerance: Patient tolerated treatment well Patient left: in chair;with call bell/phone within  reach;with family/visitor present Nurse Communication: Mobility status         Time: [id number] PT Time Calculation (min): 21 min   Charges:   PT Evaluation $Initial PT Evaluation Tier I: 1 Procedure PT Treatments $Gait Training: 8-22 mins   PT G Codes:          LEMYRE,KATHrine E 10/04/2014, 12:42 PM Carmelia Bake, PT, DPT 10/04/2014 Pager: [phone]

## 2014-10-04 NOTE — Progress Notes (Addendum)
Clinical Social Work Department BRIEF PSYCHOSOCIAL ASSESSMENT 10/05/2014  Patient:  Larry Mcfarland, Larry Mcfarland     Account Number:  [id number]     Admit date:  10/03/2014  Clinical Social Worker:  Ulyess Blossom  Date/Time:  10/04/2014 01:30 PM  Referred by:  Physician  Date Referred:  10/04/2014 Referred for  SNF Placement   Other Referral:   Interview type:  Patient Other interview type:   and patient son via telephone    PSYCHOSOCIAL DATA Living Status:  ALONE Admitted from facility:   Level of care:   Primary support name:  Larry Mcfarland [id number]/[phone] Primary support relationship to patient:  CHILD, ADULT Degree of support available:   adequate    CURRENT CONCERNS Current Concerns  Post-Acute Placement   Other Concerns:    SOCIAL WORK ASSESSMENT / PLAN CSW received referral for New SNF.    CSW met with pt alone at bedside. Pt reports that his son is here at the hospital, but went to get some lunch. Pt discussed that currently he lives alone, but anticipates that he will need rehab upon discharge. CSW confirmed with pt that recommendation is for short term rehab before returning home. Pt shared that he explored some options prior to admission and prefers [facility] or [facility]. Pt stated that his first choice will be [facility]. CSW expressed understanding and discuss process of referral to SNF facilities. CSW provided CSW contact information in order for pt to provide to pt son.    CSW completed FL2 and initiated SNF search to [facility]. CSW contacted [facility] who reviewed referral and confirmed that they can offer pt a bed.    CSW received phone call from pt son, Larry Mcfarland. CSW shared with pt son that [facility] was able to offer pt a bed and had private room availability. Pt and pt son confirmed that they want to accept bed offer from [facility].    CSW to continue to follow and assist with pt discharge needs to [facility] when medically stable.    Assessment/plan status:  Psychosocial Support/Ongoing Assessment of Needs Other assessment/ plan:   discharge planning   Information/referral to community resources:   [facility] list    PATIENT'S/FAMILY'S RESPONSE TO PLAN OF CARE: Pt alert and oriented x 4. Pt pleasant and actively engaged in conversation. Pt agreeable to SNF for rehab and pleased that [facility] is able to offer a bed.    [name], MSW, [place] [facility] [phone]

## 2014-10-04 NOTE — Care Management Note (Signed)
    Page 1 of 1   10/04/2014     2:26:32 PM CARE MANAGEMENT NOTE 10/04/2014  Patient:  Larry Mcfarland, Larry Mcfarland   Account Number:  [id number]  Date Initiated:  10/04/2014  Documentation initiated by:  DAVIS,RHONDA  Subjective/Objective Assessment:   80 year old male who  returns and wants to have his hiatal hernia repair. He's had at least 2 more attacks one of which was worse than the others. He tried to vomit but could not. The pain resolved after about 5 hours. He has felt fine in b     Action/Plan:   homw when stable   Anticipated DC Date:  10/07/2014   Anticipated DC Plan:  HOME/SELF CARE         Choice offered to / List presented to:             Status of service:  In process, will continue to follow Medicare Important Message given?   (If response is "NO", the following Medicare IM given date fields will be blank) Date Medicare IM given:   Medicare IM given by:   Date Additional Medicare IM given:   Additional Medicare IM given by:    Discharge Disposition:    Per UR Regulation:  Reviewed for med. necessity/level of care/duration of stay  If discussed at [place] of Stay Meetings, dates discussed:    Comments:  [id number]/Rhonda Rosana Hoes, RN, BSN, CCM Chart reviewed. Discharge needs and patient's stay to be reviewed and followed by case manager.

## 2014-10-04 NOTE — Plan of Care (Signed)
Problem: Phase I Progression Outcomes Goal: Pain controlled with appropriate interventions Outcome: Completed/Met Date Met:  10/04/14 Goal: Incision/dressings dry and intact Outcome: Completed/Met Date Met:  10/04/14 Goal: Sutures/staples intact Outcome: Completed/Met Date Met:  10/04/14 Goal: Tubes/drains patent Outcome: Completed/Met Date Met:  10/04/14

## 2014-10-04 NOTE — Progress Notes (Signed)
1 Day Post-Op  Subjective: Seems to be doing reasonably well. Abdomen sore, but appropriate. Denies nausea or vomiting. Denies any shortness of breath. Alert. Friendly. Cooperative. No agitation. Good urine output. Urine clear. Next line gastrostomy tube draining normally. BP 146/75. Heart rate 65, sinus rhythm. SPO2 100% on 2 L nasal. Pain score 3  Hemoglobin 11.3, stable. The VBC 14,000. BUN 29 and creatinine 1.5, which is baseline. Potassium 4.9. Glucose 147.  Objective: Vital signs in last 24 hours: Temp:  [96.9 F (36.1 C)-98.1 F (36.7 C)] 98.1 F (36.7 C) (11/05 0400) Pulse Rate:  [52-93] 65 (11/05 0600) Resp:  [10-26] 21 (11/05 0600) BP: (110-172)/(30-79) 146/75 mmHg (11/05 0600) SpO2:  [94 %-100 %] 100 % (11/05 0600) Weight:  [125 lb (56.7 kg)-130 lb 8.2 oz (59.2 kg)] 130 lb 8.2 oz (59.2 kg) (11/05 0457)    Intake/Output from previous day: 11/04 0701 - 11/05 0700 In: 3275 [I.V.:3225] Out: 1295 [Urine:1010; Drains:210; Blood:75] Intake/Output this shift: Total I/O In: 865 [I.V.:825; Other:40] Out: 760 [Urine:560; Drains:200]   EXAM: General appearance: alert. Oriented. Cooperative. Mental status normal. Personality back to baseline. Resp: clear to auscultation bilaterally GI: abdomen soft. Nondistended. Occasional bowel sounds. Wounds and G-tube site looked good. Appropriate incisional tenderness.  Lab Results:  Results for orders placed or performed during the hospital encounter of 10/03/14 (from the past 24 hour(s))  CBC     Status: Abnormal   Collection Time: 10/03/14 11:42 AM  Result Value Ref Range   WBC 14.4 (H) 4.0 - 10.5 K/uL   RBC 3.40 (L) 4.22 - 5.81 MIL/uL   Hemoglobin 10.8 (L) 13.0 - 17.0 g/dL   HCT 33.0 (L) 39.0 - 52.0 %   MCV 97.1 78.0 - 100.0 fL   MCH 31.8 26.0 - 34.0 pg   MCHC 32.7 30.0 - 36.0 g/dL   RDW 13.1 11.5 - 15.5 %   Platelets 158 150 - 400 K/uL  Basic metabolic panel     Status: Abnormal   Collection Time: 10/03/14 11:42 AM   Result Value Ref Range   Sodium 140 137 - 147 mEq/L   Potassium 4.5 3.7 - 5.3 mEq/L   Chloride 107 96 - 112 mEq/L   CO2 21 19 - 32 mEq/L   Glucose, Bld 195 (H) 70 - 99 mg/dL   BUN 32 (H) 6 - 23 mg/dL   Creatinine, Ser 1.62 (H) 0.50 - 1.35 mg/dL   Calcium 8.4 8.4 - 10.5 mg/dL   GFR calc non Af Amer 36 (L) >90 mL/min   GFR calc Af Amer 41 (L) >90 mL/min   Anion gap 12 5 - 15  Basic metabolic panel     Status: Abnormal   Collection Time: 10/04/14  3:35 AM  Result Value Ref Range   Sodium 139 137 - 147 mEq/L   Potassium 4.9 3.7 - 5.3 mEq/L   Chloride 108 96 - 112 mEq/L   CO2 22 19 - 32 mEq/L   Glucose, Bld 147 (H) 70 - 99 mg/dL   BUN 29 (H) 6 - 23 mg/dL   Creatinine, Ser 1.50 (H) 0.50 - 1.35 mg/dL   Calcium 8.9 8.4 - 10.5 mg/dL   GFR calc non Af Amer 39 (L) >90 mL/min   GFR calc Af Amer 45 (L) >90 mL/min   Anion gap 9 5 - 15  CBC     Status: Abnormal   Collection Time: 10/04/14  3:35 AM  Result Value Ref Range   WBC 14.0 (H) 4.0 -  10.5 K/uL   RBC 3.53 (L) 4.22 - 5.81 MIL/uL   Hemoglobin 11.3 (L) 13.0 - 17.0 g/dL   HCT 33.6 (L) 39.0 - 52.0 %   MCV 95.2 78.0 - 100.0 fL   MCH 32.0 26.0 - 34.0 pg   MCHC 33.6 30.0 - 36.0 g/dL   RDW 13.0 11.5 - 15.5 %   Platelets 180 150 - 400 K/uL     Studies/Results: No results found.  Marland Kitchen antiseptic oral rinse  7 mL Mouth Rinse BID  . heparin  5,000 Units Subcutaneous 3 times per day  . metoprolol  2.5 mg Intravenous 4 times per day  . pantoprazole (PROTONIX) IV  40 mg Intravenous Q12H     Assessment/Plan: s/p Procedure(s): LAPAROSCOPIC REPAIR GIANT HIATAL HERNIA, POSSIBLE OPEN GASTROSTOMY TUBE  POD#1.  Stable Proceed with Gastrografin swallow this morning. If that goes well will allow sips of clear liquids and oral medications Leave G-tube on drainage for now, but if tolerates clear liquids will need to clamp and see how he does. Some risk for gastroparesis Discontinue Foley catheter tomorrow Continue telemetry for 1 more  day  PT, OT, case management consults. Anticipate need for transient inpatient rehabilitation or SNF(?target Monday)  prior to transition home.   patient has already done some is on research about facilities that he might want to use.  Hypertension.  Restart oral meds once we are sure that he can swallow safely. Chronic kidney disease stage III Anxiety BPH Remote history CAD, underwent preoperative evaluation by Dr. Johnsie Cancel single episode atrial fibrillation in past, currently in NSR. Heart rate and BP stable on low-dose IV Lopressor.  I will be out of town for the next 3 days. My partners will care for him in my absence. Patient is aware of this.   @PROBHOSP @  LOS: 1 day    INGRAM,HAYWOOD M 10/04/2014  . .prob

## 2014-10-04 NOTE — Plan of Care (Signed)
Problem: Phase I Progression Outcomes Goal: OOB as tolerated unless otherwise ordered Outcome: Progressing Goal: Vital signs/hemodynamically stable Outcome: Progressing

## 2014-10-04 NOTE — Evaluation (Addendum)
Occupational Therapy Evaluation Patient Details Name: Larry Mcfarland MRN: [id number] DOB: 08-07-1921 Today's Date: 10/04/2014    History of Present Illness Pt is a 80 year old male s/p lap repair giant hiatal hernia and gastrostomy tube on 10/03/14   Clinical Impression   This 80 year old man was admitted for the above surgeries.  Pt will benefit from skilled OT to increase safety and independence with ADLs.  Pt currently needs up to max A for LB adls due to pain/inability to bend over.  Will follow in acute and goals are for overall min A.    Follow Up Recommendations  SNF    Equipment Recommendations   (likely none by OT)    Recommendations for Other Services       Precautions / Restrictions Precautions Precautions: Fall Precaution Comments: g-tube Restrictions Weight Bearing Restrictions: No      Mobility Bed Mobility           General bed mobility comments: not tested with OT  Transfers Overall transfer level: Needs assistance Equipment used: Rolling walker (2 wheeled) Transfers: Sit to/from Stand Sit to Stand: Min assist         General transfer comment: vcs for hand placement    Balance                                            ADL Overall ADL's : Needs assistance/impaired     Grooming: Set up;Sitting   Upper Body Bathing: Set up;Sitting   Lower Body Bathing: Maximal assistance;Sit to/from stand   Upper Body Dressing : Minimal assistance;Sitting   Lower Body Dressing: Maximal assistance;Sit to/from stand                 General ADL Comments: pt has high pain when moving. Educated on AE but did not practice with this yet. Pt plans rehab prior to home     Vision                     Perception     Praxis      Pertinent Vitals/Pain Pain Assessment: Faces Faces Pain Scale: Hurts even more Pain Location: abdomen Pain Descriptors / Indicators: Operative site guarding Pain Intervention(s):  Monitored during session     Hand Dominance     Extremity/Trunk Assessment Upper Extremity Assessment Upper Extremity Assessment: Overall WFL for tasks assessed:  MMT deferred due to abd surgery          Communication Communication Communication: HOH   Cognition Arousal/Alertness: Awake/alert Behavior During Therapy: WFL for tasks assessed/performed Overall Cognitive Status: Within Functional Limits for tasks assessed                     General Comments       Exercises       Shoulder Instructions      Home Living Family/patient expects to be discharged to:: Skilled nursing facility Living Arrangements: Alone (from [place])                                      Prior Functioning/Environment Level of Independence: Independent        Comments: has RW but does not use assistive device, reports ambulating 1 mile at least 5 days/week    OT  Diagnosis: Generalized weakness   OT Problem List: Decreased strength;Decreased activity tolerance;Pain;Decreased knowledge of use of DME or AE   OT Treatment/Interventions: Self-care/ADL training;DME and/or AE instruction;Balance training;Patient/family education    OT Goals(Current goals can be found in the care plan section) Acute Rehab OT Goals Patient Stated Goal: go to rehab.  Get back to walking OT Goal Formulation: With patient Time For Goal Achievement: 10/18/14 Potential to Achieve Goals: Good ADL Goals Pt Will Perform Grooming: with supervision;standing Pt Will Perform Lower Body Bathing: with min assist;with adaptive equipment;sit to/from stand Pt Will Perform Lower Body Dressing: with min assist;with adaptive equipment;sit to/from stand Pt Will Transfer to Toilet: with supervision;ambulating;bedside commode Pt Will Perform Toileting - Clothing Manipulation and hygiene: with min assist;sit to/from stand  OT Frequency: Min 2X/week   Barriers to D/C:            Co-evaluation               End of Session    Activity Tolerance: Patient tolerated treatment well Patient left: in chair;with call bell/phone within reach   Time: 1145-1209 OT Time Calculation (min): 24 min Charges:  OT General Charges $OT Visit: 1 Procedure OT Evaluation $Initial OT Evaluation Tier I: 1 Procedure OT Treatments $Self Care/Home Management : 8-22 mins G-Codes:    SPENCER,MARYELLEN 10-12-14, 1:16 PM   Lesle Chris, OTR/L [phone] 2014-10-12

## 2014-10-05 DIAGNOSIS — Z934 Other artificial openings of gastrointestinal tract status: Secondary | ICD-10-CM

## 2014-10-05 MED ORDER — ACETAMINOPHEN 500 MG PO TABS
1000.0000 mg | ORAL_TABLET | Freq: Three times a day (TID) | ORAL | Status: DC
  Administered 2014-10-05 – 2014-10-09 (×12): 1000 mg via ORAL
  Filled 2014-10-05 (×15): qty 2

## 2014-10-05 MED ORDER — PHENOL 1.4 % MT LIQD: 2.0000 | OROMUCOSAL | Status: DC | PRN

## 2014-10-05 MED ORDER — METOPROLOL TARTRATE 12.5 MG HALF TABLET: 12.5000 mg | ORAL_TABLET | Freq: Two times a day (BID) | ORAL | Status: DC | PRN

## 2014-10-05 MED ORDER — HYDROMORPHONE HCL 1 MG/ML IJ SOLN: 0.5000 mg | INTRAMUSCULAR | Status: DC | PRN

## 2014-10-05 MED ORDER — DIPHENHYDRAMINE HCL 50 MG/ML IJ SOLN: 12.5000 mg | Freq: Four times a day (QID) | INTRAMUSCULAR | Status: DC | PRN

## 2014-10-05 MED ORDER — OXYCODONE HCL 5 MG PO TABS
2.5000 mg | ORAL_TABLET | Freq: Four times a day (QID) | ORAL | Status: DC | PRN
Start: 2014-10-05 — End: 2014-10-09
  Administered 2014-10-05 – 2014-10-06 (×4): 5 mg via ORAL
  Filled 2014-10-05 (×6): qty 1

## 2014-10-05 MED ORDER — METHOCARBAMOL 1000 MG/10ML IJ SOLN
500.0000 mg | Freq: Four times a day (QID) | INTRAVENOUS | Status: DC | PRN
  Filled 2014-10-05: qty 5

## 2014-10-05 MED ORDER — PROMETHAZINE HCL 25 MG/ML IJ SOLN: 6.2500 mg | INTRAMUSCULAR | Status: DC | PRN

## 2014-10-05 MED ORDER — METOPROLOL TARTRATE 1 MG/ML IV SOLN
5.0000 mg | Freq: Four times a day (QID) | INTRAVENOUS | Status: DC | PRN
  Administered 2014-10-07: 5 mg via INTRAVENOUS
  Filled 2014-10-05 (×2): qty 5

## 2014-10-05 MED ORDER — MENTHOL 3 MG MT LOZG
1.0000 | LOZENGE | OROMUCOSAL | Status: DC | PRN
Start: 2014-10-05 — End: 2014-10-09

## 2014-10-05 MED ORDER — BISACODYL 10 MG RE SUPP: 10.0000 mg | Freq: Two times a day (BID) | RECTAL | Status: DC | PRN

## 2014-10-05 MED ORDER — SODIUM CHLORIDE 0.9 % IJ SOLN
3.0000 mL | Freq: Two times a day (BID) | INTRAMUSCULAR | Status: DC
  Administered 2014-10-05 – 2014-10-07 (×4): 3 mL via INTRAVENOUS

## 2014-10-05 MED ORDER — ENSURE PLUS PO LIQD: 237.0000 mL | Freq: Every day | ORAL | Status: DC

## 2014-10-05 MED ORDER — SODIUM CHLORIDE 0.9 % IV SOLN: 250.0000 mL | INTRAVENOUS | Status: DC | PRN

## 2014-10-05 MED ORDER — ALPRAZOLAM 0.25 MG PO TABS: 0.1250 mg | ORAL_TABLET | Freq: Every evening | ORAL | Status: DC | PRN

## 2014-10-05 MED ORDER — ENSURE COMPLETE PO LIQD
237.0000 mL | Freq: Every day | ORAL | Status: DC
  Administered 2014-10-05 – 2014-10-06 (×2): 237 mL via ORAL

## 2014-10-05 MED ORDER — LIP MEDEX EX OINT
1.0000 "application " | TOPICAL_OINTMENT | Freq: Two times a day (BID) | CUTANEOUS | Status: DC
  Administered 2014-10-05 – 2014-10-09 (×8): 1 via TOPICAL
  Filled 2014-10-05: qty 7

## 2014-10-05 MED ORDER — LACTATED RINGERS IV BOLUS (SEPSIS): 1000.0000 mL | Freq: Three times a day (TID) | INTRAVENOUS | Status: AC | PRN

## 2014-10-05 MED ORDER — ALUM & MAG HYDROXIDE-SIMETH 200-200-20 MG/5ML PO SUSP: 30.0000 mL | Freq: Four times a day (QID) | ORAL | Status: DC | PRN

## 2014-10-05 MED ORDER — MAGIC MOUTHWASH
15.0000 mL | Freq: Four times a day (QID) | ORAL | Status: DC | PRN
  Filled 2014-10-05: qty 15

## 2014-10-05 MED ORDER — METOPROLOL TARTRATE 25 MG PO TABS
12.5000 mg | ORAL_TABLET | Freq: Two times a day (BID) | ORAL | Status: DC | PRN
  Filled 2014-10-05: qty 1

## 2014-10-05 MED ORDER — METOPROLOL TARTRATE 1 MG/ML IV SOLN: 5.0000 mg | Freq: Four times a day (QID) | INTRAVENOUS | Status: DC | PRN

## 2014-10-05 MED ORDER — SODIUM CHLORIDE 0.9 % IJ SOLN: 3.0000 mL | INTRAMUSCULAR | Status: DC | PRN

## 2014-10-05 NOTE — Progress Notes (Signed)
[facility]  [address] [place], [place] [address] Phone: [phone] FAX: [place] [phone] 09/18/23  CARE TEAM:  PCP: Dorian Heckle, MD  Outpatient Care Team: Patient Care Team: Dorian Heckle, MD as PCP - General (Internal Medicine)  Inpatient Treatment Team: Treatment Team: Attending Provider: Fanny Skates, MD; Registered Nurse: Leitha Bleak, RN; Registered Nurse: Heriberto Antigua, RN; Consulting Physician: Michael Boston, MD   Subjective:  C/o upper abdominal & chest wall pain, esp when standing up Claims pain medicines not working but falls asleep w IV fentanyl according to ICU staff Tol liquids - no more retching/chest pain  Objective:  Vital signs:  Filed Vitals:   10/04/14 2200 10/04/14 2300 10/05/14 0000 10/05/14 0200  BP: 164/66  157/89 169/87  Pulse: 69 68 74 70  Temp:      TempSrc:      Resp: $Remo'17 18 20 18  'uwStN$ Height:      Weight:      SpO2: 95% 96% 87% 93%       Intake/Output   Yesterday:  11/05 0701 - 11/06 0700 In: 0350 [P.O.:425; I.V.:950] Out: 0938 [Urine:1490; Drains:200] This shift:  Total I/O In: 575 [P.O.:75; I.V.:500] Out: 755 [Urine:755]  Bowel function:  Flatus: y  BM: n  Drain: G tube clamped  Physical Exam:  General: Pt awake/alert/oriented x4 in no acute distress Eyes: PERRL, normal EOM.  Sclera clear.  No icterus Neuro: CN II-XII intact w/o focal sensory/motor deficits. Lymph: No head/neck/groin lymphadenopathy Psych:  No delerium/psychosis/paranoia.  Complaining a lot but consolable.  Not profoundly anxious/depressed HENT: Normocephalic, Mucus membranes moist.  No thrush Neck: Supple, No tracheal deviation Chest: No chest wall pain w good excursion.  CTA bilaterally CV:  Pulses intact.  Regular rhythm MS: Normal AROM mjr joints.  No obvious deformity Abdomen: Soft.  Nondistended.  Mildly tender at incisions only.  No evidence of  peritonitis.  No incarcerated hernias.  G tube site clean Ext:  SCDs BLE.  No mjr edema.  No cyanosis Skin: No petechiae / purpura   Problem List:   Principal Problem:   Hiatal hernia s/p lap Repair with G-tube Nov 2015 Active Problems:   Gastrojejunostomy tube status   Assessment  Larry Mcfarland  80 y.o. male  2 Days Post-Op  Procedure(s): Date of Surgery: 10/03/2014  Pre op Diagnosis: Large, type III hiatal hernia Esophageal motility disorder Moderate protein calorie malnutrition  Post op Diagnosis: same  Procedure: Laparoscopic reduction of complex, type III hiatal hernia, diaphragmatic closure, laparoscopic gastrostomy tube   Stabilizing w fair pain control  Plan:  -change pain control regimen.  Ice/heat.  Standing tylenol.  Different narcotic -transfer to floor - ICU team agrees w move -anxiolysis -HTN controlled w home regimen -d/c foley -VTE prophylaxis- SCDs, etc -mobilize as tolerated to help recovery.  GET HIM UP!!!! -Work on d/c planing - probable SNF/rehab Monday if improves  I listened to & updated the patient's status to the patient & ICU RN.  Recommendations were made.  Questions were answered.  The patient expressed understanding & appreciation.   Adin Hector, M.D., F.A.C.S. Gastrointestinal and Minimally Invasive Surgery [facility] [address], [place], [place] [address] [phone] Main / Paging   10/05/2014   Results:   Labs: Results for orders placed or performed during the hospital encounter of 10/03/14 (from the past 48 hour(s))  CBC     Status:  Abnormal   Collection Time: 10/03/14 11:42 AM  Result Value Ref Range   WBC 14.4 (H) 4.0 - 10.5 K/uL   RBC 3.40 (L) 4.22 - 5.81 MIL/uL   Hemoglobin 10.8 (L) 13.0 - 17.0 g/dL   HCT 33.0 (L) 39.0 - 52.0 %   MCV 97.1 78.0 - 100.0 fL   MCH 31.8 26.0 - 34.0 pg   MCHC 32.7 30.0 - 36.0 g/dL   RDW 13.1 11.5 - 15.5 %    Platelets 158 150 - 400 K/uL  Basic metabolic panel     Status: Abnormal   Collection Time: 10/03/14 11:42 AM  Result Value Ref Range   Sodium 140 137 - 147 mEq/L   Potassium 4.5 3.7 - 5.3 mEq/L   Chloride 107 96 - 112 mEq/L   CO2 21 19 - 32 mEq/L   Glucose, Bld 195 (H) 70 - 99 mg/dL   BUN 32 (H) 6 - 23 mg/dL   Creatinine, Ser 1.62 (H) 0.50 - 1.35 mg/dL   Calcium 8.4 8.4 - 10.5 mg/dL   GFR calc non Af Amer 36 (L) >90 mL/min   GFR calc Af Amer 41 (L) >90 mL/min    Comment: (NOTE) The eGFR has been calculated using the CKD EPI equation. This calculation has not been validated in all clinical situations. eGFR's persistently <90 mL/min signify possible Chronic Kidney Disease.    Anion gap 12 5 - 15  Basic metabolic panel     Status: Abnormal   Collection Time: 10/04/14  3:35 AM  Result Value Ref Range   Sodium 139 137 - 147 mEq/L   Potassium 4.9 3.7 - 5.3 mEq/L   Chloride 108 96 - 112 mEq/L   CO2 22 19 - 32 mEq/L   Glucose, Bld 147 (H) 70 - 99 mg/dL   BUN 29 (H) 6 - 23 mg/dL   Creatinine, Ser 1.50 (H) 0.50 - 1.35 mg/dL   Calcium 8.9 8.4 - 10.5 mg/dL   GFR calc non Af Amer 39 (L) >90 mL/min   GFR calc Af Amer 45 (L) >90 mL/min    Comment: (NOTE) The eGFR has been calculated using the CKD EPI equation. This calculation has not been validated in all clinical situations. eGFR's persistently <90 mL/min signify possible Chronic Kidney Disease.    Anion gap 9 5 - 15  CBC     Status: Abnormal   Collection Time: 10/04/14  3:35 AM  Result Value Ref Range   WBC 14.0 (H) 4.0 - 10.5 K/uL   RBC 3.53 (L) 4.22 - 5.81 MIL/uL   Hemoglobin 11.3 (L) 13.0 - 17.0 g/dL   HCT 33.6 (L) 39.0 - 52.0 %   MCV 95.2 78.0 - 100.0 fL   MCH 32.0 26.0 - 34.0 pg   MCHC 33.6 30.0 - 36.0 g/dL   RDW 13.0 11.5 - 15.5 %   Platelets 180 150 - 400 K/uL    Imaging / Studies: Dg Ugi W/water Sol Cm  10/04/2014   CLINICAL DATA:  One day postop from hiatal hernia repair.  EXAM: WATER SOLUBLE UPPER GI SERIES   TECHNIQUE: Single-column upper GI series was performed using water soluble contrast.  CONTRAST:  125mL OMNIPAQUE IOHEXOL 300 MG/ML  SOLN  COMPARISON:  None.  FLUOROSCOPY TIME:  1 min 43 seconds  FINDINGS: Scout radiograph shows no evidence of dilated bowel loops.  There is no evidence of esophageal obstruction or postoperative leak. No evidence of residual hiatal hernia. Stomach is incompletely distended but otherwise unremarkable  in appearance. Esophageal dysmotility with tertiary contractions noted.  IMPRESSION: No evidence of postop leak or obstruction status post hiatal hernia repair.   Electronically Signed   By: Earle Gell M.D.   On: 10/04/2014 10:55    Medications / Allergies: per chart  Antibiotics: Anti-infectives    Start     Dose/Rate Route Frequency Ordered Stop   10/03/14 0616  ceFAZolin (ANCEF) IVPB 2 g/50 mL premix     2 g100 mL/hr over 30 Minutes Intravenous On call to O.R. 10/03/14 0616 10/03/14 0825       Note: Portions of this report may have been transcribed using voice recognition software. Every effort was made to ensure accuracy; however, inadvertent computerized transcription errors may be present.   Any transcriptional errors that result from this process are unintentional.

## 2014-10-05 NOTE — Progress Notes (Signed)
Clinical Social Work Department CLINICAL SOCIAL WORK PLACEMENT NOTE 10/05/2014  Patient:  SHONE, CECERE  Account Number:  [id number] Admit date:  10/03/2014  Clinical Social Worker:  Ulyess Blossom  Date/time:  10/04/2014 01:30 PM  Clinical Social Work is seeking post-discharge placement for this patient at the following level of care:   SKILLED NURSING   (*CSW will update this form in Epic as items are completed)   10/04/2014  Patient/family provided with [facility] Department of Clinical Social Work's list of facilities offering this level of care within the geographic area requested by the patient (or if unable, by the patient's family).  10/04/2014  Patient/family informed of their freedom to choose among providers that offer the needed level of care, that participate in Medicare, Medicaid or managed care program needed by the patient, have an available bed and are willing to accept the patient.  10/04/2014  Patient/family informed of [facility]' ownership interest in [facility], as well as of the fact that they are under no obligation to receive care at this facility.  PASARR submitted to EDS on 10/04/2014 PASARR number received on 10/04/2014  FL2 transmitted to all facilities in geographic area requested by pt/family on  10/04/2014 FL2 transmitted to all facilities within larger geographic area on   Patient informed that his/her managed care company has contracts with or will negotiate with  certain facilities, including the following:     Patient/family informed of bed offers received:  10/04/2014 Patient chooses bed at [facility] Physician recommends and patient chooses bed at    Patient to be transferred to  on   Patient to be transferred to facility by  Patient and family notified of transfer on  Name of family member notified:    The following physician request were entered in Epic:   Additional Comments:   [name], MSW,  [facility] Work [phone]

## 2014-10-06 MED ORDER — SODIUM CHLORIDE 0.9 % IJ SOLN: 3.0000 mL | Freq: Two times a day (BID) | INTRAMUSCULAR | Status: DC

## 2014-10-06 MED ORDER — ENSURE COMPLETE PO LIQD
237.0000 mL | Freq: Three times a day (TID) | ORAL | Status: DC
  Administered 2014-10-07 – 2014-10-09 (×4): 237 mL via ORAL

## 2014-10-06 MED ORDER — SODIUM CHLORIDE 0.9 % IV SOLN: 250.0000 mL | INTRAVENOUS | Status: DC | PRN

## 2014-10-06 MED ORDER — LACTATED RINGERS IV BOLUS (SEPSIS): 1000.0000 mL | Freq: Three times a day (TID) | INTRAVENOUS | Status: AC | PRN

## 2014-10-06 MED ORDER — METOPROLOL TARTRATE 25 MG PO TABS
25.0000 mg | ORAL_TABLET | Freq: Two times a day (BID) | ORAL | Status: DC
Start: 2014-10-06 — End: 2014-10-09
  Administered 2014-10-06 – 2014-10-09 (×7): 25 mg via ORAL
  Filled 2014-10-06 (×8): qty 1

## 2014-10-06 MED ORDER — HYDROMORPHONE HCL 1 MG/ML IJ SOLN: 0.2500 mg | INTRAMUSCULAR | Status: DC | PRN

## 2014-10-06 MED ORDER — ENSURE COMPLETE PO LIQD: 237.0000 mL | Freq: Three times a day (TID) | ORAL | Status: DC

## 2014-10-06 MED ORDER — PANTOPRAZOLE SODIUM 40 MG PO TBEC
40.0000 mg | DELAYED_RELEASE_TABLET | Freq: Two times a day (BID) | ORAL | Status: DC
  Administered 2014-10-06 – 2014-10-09 (×6): 40 mg via ORAL
  Filled 2014-10-06 (×8): qty 1

## 2014-10-06 MED ORDER — SODIUM CHLORIDE 0.9 % IJ SOLN: 3.0000 mL | INTRAMUSCULAR | Status: DC | PRN

## 2014-10-06 MED ORDER — POLYETHYLENE GLYCOL 3350 17 G PO PACK
17.0000 g | PACK | Freq: Two times a day (BID) | ORAL | Status: DC
  Administered 2014-10-06 – 2014-10-09 (×4): 17 g via ORAL
  Filled 2014-10-06 (×9): qty 1

## 2014-10-06 MED ORDER — SACCHAROMYCES BOULARDII 250 MG PO CAPS
250.0000 mg | ORAL_CAPSULE | Freq: Two times a day (BID) | ORAL | Status: DC
  Administered 2014-10-06 – 2014-10-09 (×7): 250 mg via ORAL
  Filled 2014-10-06 (×9): qty 1

## 2014-10-06 NOTE — Progress Notes (Signed)
[facility]  [address] [place], [place] [address]26-[address] Phone: [phone] FAX: [place] [id number] 11/05/23  CARE TEAM:  PCP: Dorian Heckle, MD  Outpatient Care Team: Patient Care Team: Dorian Heckle, MD as PCP - General (Internal Medicine)  Inpatient Treatment Team: Treatment Team: Attending Provider: Fanny Skates, MD; Registered Nurse: Heriberto Antigua, RN; Consulting Physician: Michael Boston, MD; Registered Nurse: Eston Mould, RN   Subjective:  Inc HR while standing - kept in [place] upper abdominal & chest wall pain, esp when standing up - pain medicines controlling better Tol pureed OK but does not like taste Son in room  Objective:  Vital signs:  Filed Vitals:   10/05/14 2130 10/05/14 2200 10/06/14 0000 10/06/14 0400  BP:  107/50  158/89  Pulse: 95 79  102  Temp:   97.6 F (36.4 C) 98.8 F (37.1 C)  TempSrc:   Oral Oral  Resp: 27 18  21   Height:      Weight:    132 lb 7.9 oz (60.1 kg)  SpO2: 96% 96%  91%       Intake/Output   Yesterday:  11/06 0701 - 11/07 0700 In: 1150 [I.V.:1150] Out: 1600 [Urine:1600] This shift:     Bowel function:  Flatus: y  BM: n  Drain: G tube clamped  Physical Exam:  General: Pt awake/alert/oriented x4 in no acute distress Eyes: PERRL, normal EOM.  Sclera clear.  No icterus Neuro: CN II-XII intact w/o focal sensory/motor deficits. Lymph: No head/neck/groin lymphadenopathy Psych:  No delerium/psychosis/paranoia.  Complaining a lot but consolable.  Not profoundly anxious/depressed HENT: Normocephalic, Mucus membranes moist.  No thrush Neck: Supple, No tracheal deviation Chest: No chest wall pain w good excursion.  CTA bilaterally CV:  Pulses intact.  Regular rhythm MS: Normal AROM mjr joints.  No obvious deformity Abdomen: Soft.  Nondistended.  Mildly tender at incisions only.  No evidence of peritonitis.  No incarcerated  hernias.  G tube site clean Ext:  SCDs BLE.  No mjr edema.  No cyanosis Skin: No petechiae / purpura   Problem List:   Principal Problem:   Hiatal hernia s/p lap Repair with G-tube Nov 2015 Active Problems:   Gastrojejunostomy tube status   Assessment  Larry Mcfarland  80 y.o. male  3 Days Post-Op  Procedure(s): Date of Surgery: 10/03/2014  Pre op Diagnosis: Large, type III hiatal hernia Esophageal motility disorder Moderate protein calorie malnutrition  Post op Diagnosis: same  Procedure: Laparoscopic reduction of complex, type III hiatal hernia, diaphragmatic closure, laparoscopic gastrostomy tube   Stabilizing w fair pain control  Plan:  -Pain control regimen.  Ice/heat.  Standing tylenol.  Oxycodone for backup -transfer to tele - ICU team agrees w move -HTN & inc HR - improved w extra metoprolol.  Inc metolprolol 25 BID & follow.  Check EKG for prob PACs vs Afib.  Cardiology consult if not better -anxiolysis -inc PO shakes -VTE prophylaxis- SCDs, etc -mobilize as tolerated to help recovery.  GET HIM UP!!!! -Work on d/c planing - probable SNF/rehab Monday if improves  I listened to & updated the patient's status to the patient & son.  Recommendations were made.  Questions were answered.  The patient expressed understanding & appreciation.   Adin Hector, M.D., F.A.C.S. Gastrointestinal and Minimally Invasive Surgery [facility] [address], [place], [place] [address] [phone] Main / Paging   10/06/2014  Results:   Labs: No results found for this or any previous visit (from the past 48 hour(s)).  Imaging / Studies: Dg Ugi W/water Sol Cm  10/04/2014   CLINICAL DATA:  One day postop from hiatal hernia repair.  EXAM: WATER SOLUBLE UPPER GI SERIES  TECHNIQUE: Single-column upper GI series was performed using water soluble contrast.  CONTRAST:  118mL OMNIPAQUE IOHEXOL 300 MG/ML  SOLN   COMPARISON:  None.  FLUOROSCOPY TIME:  1 min 43 seconds  FINDINGS: Scout radiograph shows no evidence of dilated bowel loops.  There is no evidence of esophageal obstruction or postoperative leak. No evidence of residual hiatal hernia. Stomach is incompletely distended but otherwise unremarkable in appearance. Esophageal dysmotility with tertiary contractions noted.  IMPRESSION: No evidence of postop leak or obstruction status post hiatal hernia repair.   Electronically Signed   By: Earle Gell M.D.   On: 10/04/2014 10:55    Medications / Allergies: per chart  Antibiotics: Anti-infectives    Start     Dose/Rate Route Frequency Ordered Stop   10/03/14 0616  ceFAZolin (ANCEF) IVPB 2 g/50 mL premix     2 g100 mL/hr over 30 Minutes Intravenous On call to O.R. 10/03/14 0616 10/03/14 0825       Note: Portions of this report may have been transcribed using voice recognition software. Every effort was made to ensure accuracy; however, inadvertent computerized transcription errors may be present.   Any transcriptional errors that result from this process are unintentional.

## 2014-10-07 ENCOUNTER — Inpatient Hospital Stay (HOSPITAL_COMMUNITY): Payer: Medicare Other

## 2014-10-07 LAB — CBC
HCT: 35.6 % — ABNORMAL LOW (ref 39.0–52.0)
Hemoglobin: 11.8 g/dL — ABNORMAL LOW (ref 13.0–17.0)
MCH: 31.6 pg (ref 26.0–34.0)
MCHC: 33.1 g/dL (ref 30.0–36.0)
MCV: 95.2 fL (ref 78.0–100.0)
Platelets: 198 10*3/uL (ref 150–400)
RBC: 3.74 MIL/uL — ABNORMAL LOW (ref 4.22–5.81)
RDW: 13.4 % (ref 11.5–15.5)
WBC: 11.2 10*3/uL — ABNORMAL HIGH (ref 4.0–10.5)

## 2014-10-07 LAB — BASIC METABOLIC PANEL
Anion gap: 11 (ref 5–15)
BUN: 28 mg/dL — ABNORMAL HIGH (ref 6–23)
CO2: 23 mEq/L (ref 19–32)
Calcium: 9.2 mg/dL (ref 8.4–10.5)
Chloride: 105 mEq/L (ref 96–112)
Creatinine, Ser: 1.36 mg/dL — ABNORMAL HIGH (ref 0.50–1.35)
GFR calc Af Amer: 51 mL/min — ABNORMAL LOW (ref >90)
GFR calc non Af Amer: 44 mL/min — ABNORMAL LOW (ref >90)
Glucose, Bld: 129 mg/dL — ABNORMAL HIGH (ref 70–99)
Potassium: 3.9 mEq/L (ref 3.7–5.3)
Sodium: 139 mEq/L (ref 137–147)

## 2014-10-07 LAB — PRO B NATRIURETIC PEPTIDE: Pro B Natriuretic peptide (BNP): 2812 pg/mL — ABNORMAL HIGH (ref 0–450)

## 2014-10-07 MED ORDER — SODIUM CHLORIDE 0.9 % IJ SOLN: 3.0000 mL | INTRAMUSCULAR | Status: DC | PRN

## 2014-10-07 MED ORDER — SODIUM CHLORIDE 0.9 % IV SOLN: 250.0000 mL | INTRAVENOUS | Status: DC | PRN

## 2014-10-07 MED ORDER — SODIUM CHLORIDE 0.9 % IJ SOLN
3.0000 mL | Freq: Two times a day (BID) | INTRAMUSCULAR | Status: DC
  Administered 2014-10-07 – 2014-10-09 (×5): 3 mL via INTRAVENOUS

## 2014-10-07 MED ORDER — ALPRAZOLAM 0.25 MG PO TABS
0.1250 mg | ORAL_TABLET | Freq: Two times a day (BID) | ORAL | Status: DC | PRN
  Administered 2014-10-08: 0.125 mg via ORAL
  Filled 2014-10-07: qty 1

## 2014-10-07 MED ORDER — BISACODYL 10 MG RE SUPP
10.0000 mg | Freq: Every day | RECTAL | Status: DC
  Administered 2014-10-07: 10 mg via RECTAL
  Filled 2014-10-07: qty 1

## 2014-10-07 NOTE — Progress Notes (Signed)
[facility]  [address]  [place], [place] [address] Phone: [phone] FAX: [phone]    Larry Mcfarland [id number] 06/24/1921  CARE TEAM:  PCP: SCHOOLER, KAREN, MD  Outpatient Care Team: Patient Care Team: Karen Schooler, MD as PCP - General (Internal Medicine)  Inpatient Treatment Team: Treatment Team: Attending Provider: Haywood Ingram, MD; Registered Nurse: Elizabeth H Shepherd, RN; Consulting Physician: Steven Gross, MD; Technician: Shataun Lechelle Russell, NT; Technician: Jessica Dawn Holland, NT   Subjective:  Pt moved to telemetry - no major events Tol pureed OK - hesitant to move to solids yesterday but wanting to try today Wanting to get up more  Objective:  Vital signs:  Filed Vitals:   10/06/14 1400 10/06/14 1626 10/06/14 2132 10/07/14 0645  BP: 118/56 119/69 107/72 130/75  Pulse: 79  85 83  Temp:   98.2 F (36.8 C) 98.4 F (36.9 C)  TempSrc:   Oral Oral  Resp: 25 26 22 20  Height:      Weight:      SpO2: 98% 98% 97% 95%    Last BM Date: 10/01/14 (per pt report)  Intake/Output   Yesterday:  11/07 0701 - 11/08 0700 In: 400 [I.V.:400] Out: 625 [Urine:625] This shift:     Bowel function:  Flatus: y  BM: n  Drain: G tube clamped  Physical Exam:  General: Pt awake/alert/oriented x4 in no acute distress.  Smiling Eyes: PERRL, normal EOM.  Sclera clear.  No icterus Neuro: CN II-XII intact w/o focal sensory/motor deficits. Lymph: No head/neck/groin lymphadenopathy Psych:  No delerium/psychosis/paranoia.  Complaining less.  Not profoundly anxious/depressed HENT: Normocephalic, Mucus membranes moist.  No thrush Neck: Supple, No tracheal deviation Chest: No chest wall pain w good excursion.  CTA bilaterally CV:  Pulses intact.  Regular rhythm MS: Normal AROM mjr joints.  No obvious deformity Abdomen: Soft.  Nondistended.  Mildly tender at incisions only.  No evidence of peritonitis.  No  incarcerated hernias.  G tube site clean Ext:  SCDs BLE.  No mjr edema.  No cyanosis Skin: No petechiae / purpura   Problem List:   Principal Problem:   Hiatal hernia s/p lap Repair with G-tube Nov 2015 Active Problems:   Gastrojejunostomy tube status   Assessment  Larry Mcfarland  80 y.o. male  4 Days Post-Op  Procedure(s): Date of Surgery: 10/03/2014  Pre op Diagnosis: Large, type III hiatal hernia Esophageal motility disorder Moderate protein calorie malnutrition  Post op Diagnosis: same  Procedure: Laparoscopic reduction of complex, type III hiatal hernia, diaphragmatic closure, laparoscopic gastrostomy tube   Stabilizing w better pain control  Plan:  -Pain control regimen.  Ice/heat.  Standing tylenol.  Oxycodone for backup -transfer to tele - ICU team agrees w move -HTN & inc HR - improved w extra metoprolol BID & follow.  EKG c/w prob PACs vs Afib.  Cardiology consult if not better -anxiolysis -inc PO shakes -VTE prophylaxis- SCDs, etc -mobilize as tolerated to help recovery.  GET HIM UP!!!! -Work on d/c planing - probable SNF/rehab Monday if improves  I listened to & updated the patient's status to the patient & son.  Recommendations were made.  Questions were answered.  The patient expressed understanding & appreciation.   Steven C. Gross, M.D., F.A.C.S. Gastrointestinal and Minimally Invasive Surgery [facility] [address] [place], [place] [address] [phone] Main / Paging   10/07/2014   Results:   Labs: Results for orders placed   or performed during the hospital encounter of 10/03/14 (from the past 48 hour(s))  CBC     Status: Abnormal   Collection Time: 10/07/14  6:05 AM  Result Value Ref Range   WBC 11.2 (H) 4.0 - 10.5 K/uL   RBC 3.74 (L) 4.22 - 5.81 MIL/uL   Hemoglobin 11.8 (L) 13.0 - 17.0 g/dL   HCT 35.6 (L) 39.0 - 52.0 %   MCV 95.2 78.0 - 100.0 fL   MCH 31.6 26.0  - 34.0 pg   MCHC 33.1 30.0 - 36.0 g/dL   RDW 13.4 11.5 - 15.5 %   Platelets 198 150 - 400 K/uL  Basic metabolic panel     Status: Abnormal   Collection Time: 10/07/14  6:05 AM  Result Value Ref Range   Sodium 139 137 - 147 mEq/L   Potassium 3.9 3.7 - 5.3 mEq/L   Chloride 105 96 - 112 mEq/L   CO2 23 19 - 32 mEq/L   Glucose, Bld 129 (H) 70 - 99 mg/dL   BUN 28 (H) 6 - 23 mg/dL   Creatinine, Ser 1.36 (H) 0.50 - 1.35 mg/dL   Calcium 9.2 8.4 - 10.5 mg/dL   GFR calc non Af Amer 44 (L) >90 mL/min   GFR calc Af Amer 51 (L) >90 mL/min    Comment: (NOTE) The eGFR has been calculated using the CKD EPI equation. This calculation has not been validated in all clinical situations. eGFR's persistently <90 mL/min signify possible Chronic Kidney Disease.    Anion gap 11 5 - 15  Pro b natriuretic peptide (BNP)     Status: Abnormal   Collection Time: 10/07/14  6:05 AM  Result Value Ref Range   Pro B Natriuretic peptide (BNP) 2812.0 (H) 0 - 450 pg/mL    Imaging / Studies: No results found.  Medications / Allergies: per chart  Antibiotics: Anti-infectives    Start     Dose/Rate Route Frequency Ordered Stop   10/03/14 0616  ceFAZolin (ANCEF) IVPB 2 g/50 mL premix     2 g100 mL/hr over 30 Minutes Intravenous On call to O.R. 10/03/14 0616 10/03/14 0825       Note: Portions of this report may have been transcribed using voice recognition software. Every effort was made to ensure accuracy; however, inadvertent computerized transcription errors may be present.   Any transcriptional errors that result from this process are unintentional.

## 2014-10-07 NOTE — Plan of Care (Signed)
Problem: Phase I Progression Outcomes Goal: OOB as tolerated unless otherwise ordered Outcome: Completed/Met Date Met:  10/07/14 Goal: Voiding-avoid urinary catheter unless indicated Outcome: Completed/Met Date Met:  10/07/14 Goal: Vital signs/hemodynamically stable Outcome: Completed/Met Date Met:  10/07/14

## 2014-10-07 NOTE — Progress Notes (Signed)
Patient ID: Larry Mcfarland, male   DOB: [birth year], 80 y.o.   MRN: [id number] 4 Days Post-Op  Subjective: No major complaints. He has felt some shortness of breath but he thinks this may be a little bit of anxiety. Has been walking in the halls and doing well according to his son. Eating small amounts but no nausea or dysphagia. Mild abdominal soreness.  Objective: Vital signs in last 24 hours: Temp:  [98.2 F (36.8 C)-98.4 F (36.9 C)] 98.4 F (36.9 C) (11/08 0645) Pulse Rate:  [79-85] 83 (11/08 0645) Resp:  [20-26] 20 (11/08 0645) BP: (107-130)/(56-75) 130/75 mmHg (11/08 0645) SpO2:  [95 %-98 %] 95 % (11/08 0645) Last BM Date: 10/01/14 (per pt report)  Intake/Output from previous day: 11/07 0701 - 11/08 0700 In: 640 [P.O.:240; I.V.:400] Out: 775 [Urine:775] Intake/Output this shift:    General appearance: alert, cooperative and no distress Resp: breath sounds clear and equal without wheezing or increased work of breathing GI: mild tenderness around the gastrostomy tube. Incisions clean and dry.  Lab Results:   Recent Labs  10/07/14 0605  WBC 11.2*  HGB 11.8*  HCT 35.6*  PLT 198   BMET  Recent Labs  10/07/14 0605  NA 139  K 3.9  CL 105  CO2 23  GLUCOSE 129*  BUN 28*  CREATININE 1.36*  CALCIUM 9.2     Studies/Results: No results found.  Anti-infectives: Anti-infectives    Start     Dose/Rate Route Frequency Ordered Stop   10/03/14 0616  ceFAZolin (ANCEF) IVPB 2 g/50 mL premix     2 g100 mL/hr over 30 Minutes Intravenous On call to O.R. 10/03/14 0616 10/03/14 0825      Assessment/Plan: s/p Procedure(s): LAPAROSCOPIC REPAIR GIANT HIATAL HERNIA, POSSIBLE OPEN GASTROSTOMY TUBE Generally he appears to be doing very well. Mild shortness of breath that may be anxiety related. Will increase Xanax 2 twice a day when necessary. Check chest x-ray.   LOS: 4 days    HOXWORTH,BENJAMIN T 10/07/2014

## 2014-10-08 DIAGNOSIS — I471 Supraventricular tachycardia: Secondary | ICD-10-CM

## 2014-10-08 DIAGNOSIS — I499 Cardiac arrhythmia, unspecified: Secondary | ICD-10-CM

## 2014-10-08 DIAGNOSIS — K440 Diaphragmatic hernia with obstruction, without gangrene: Secondary | ICD-10-CM

## 2014-10-08 MED ORDER — PNEUMOCOCCAL VAC POLYVALENT 25 MCG/0.5ML IJ INJ
0.5000 mL | INJECTION | INTRAMUSCULAR | Status: DC
  Filled 2014-10-08 (×2): qty 0.5

## 2014-10-08 NOTE — Progress Notes (Signed)
Notified by [facility] that pt had 10-beat run of SVT at 0441. Found pt asleep in room. Rhythm converted back to NSR. Notified hospitalist and obtained vital signs. VSS, pt denies CP, SOB.

## 2014-10-08 NOTE — Consult Note (Signed)
CARDIOLOGY CONSULT NOTE       Patient ID: Larry Mcfarland MRN: [id number] DOB/AGE: 1921-02-24 80 y.o.  Admit date: 10/03/2014 Referring Physician:  Dalbert Batman Primary Physician: Dorian Heckle, MD Primary Cardiologist:  Johnsie Cancel Reason for Consultation:  PAC;s / SVT  Principal Problem:   Hiatal hernia s/p lap Repair with G-tube Nov 2015 Active Problems:   Gastrojejunostomy tube status   HPI:   80 yo seen by me a few weeks ago for preop clearance  Cleared without testing  Had  a large hiatal hernia causing significant pain, indigestion and bloating. Had lost about 30lbs  He has no active cardiac history and  had uncomplicated inguinal hernia surgery with general anesthesia about a year ago He is on good Rx for HTN and elevated lipids Distant history of PAF over 10 years ago with no recurrence Occasionally gets "double" beat but nothing sustained. His attacks are GI in nature Had bad spell this weekend after eating Epigastirc pain vomiting and nausea. Lasted most of the day. Discussed nature of laprascopic stomach surgery and primary risk being his age and possibly PAF. Underwent uncomplicated surgery [id number]  His home beta blocker has been resumed.  Due to go to [facility] in am  Taking regular food as of today.  Telemetry review shows SR PAC;s  No long runs of SVT  Skipped beats are asymptomatic   ROS All other systems reviewed and negative except as noted above  Past Medical History  Diagnosis Date  . Hypertension   . Hyperlipemia   . Inguinal hernia without mention of obstruction or gangrene, unilateral or unspecified, (not specified as recurrent)   . CKD (chronic kidney disease), stage III   . Anxiety   . Osteopenia 2002  . BPH (benign prostatic hyperplasia)   . Osteoarthritis   . Gout   . CAD (coronary artery disease)   . GERD (gastroesophageal reflux disease)   . Vision disturbance   . Lumbar radiculopathy   . Vertigo 1999    negative ct scan head; rare  recurrance, last in 2013  . Bleeding from the nose 2009    cautery by Dr. Erik Obey  . Cataract     right eye  . Impaired fasting glucose   . Skin cancer of forehead     left (BCE)  . Distal radius fracture 2013    Dr. Fredna Dow  . Major depressive disorder, single episode, unspecified   . Nosebleed     most recent 05/24/13  to have  cauterized prior to surgery   . Dysrhythmia     hx of atrial fib- 10 years ago   . H/O hiatal hernia   . Anginal pain     Family History  Problem Relation Age of Onset  . Heart disease Mother   . Heart disease Father   . Cancer Sister     breast     History   Social History  . Marital Status: Widowed    Spouse Name: N/A    Number of Children: N/A  . Years of Education: N/A   Occupational History  . Not on file.   Social History Main Topics  . Smoking status: Never Smoker   . Smokeless tobacco: Never Used  . Alcohol Use: Yes     Comment: occasional  . Drug Use: No  . Sexual Activity: Not on file   Other Topics Concern  . Not on file   Social History Narrative    Past Surgical History  Procedure Laterality  Date  . Tonsillectomy      at age 76 yrs old  . Eye surgery      right eye cataract surgery   . Inguinal hernia repair Right 06/07/2013    Procedure: LAPAROSCOPIC INGUINAL HERNIA REPAIR;  Surgeon: Adin Hector, MD;  Location: [facility];  Service: General;  Laterality: Right;  With Mesh  . Insertion of mesh Right 06/07/2013    Procedure: INSERTION OF MESH;  Surgeon: Adin Hector, MD;  Location: [facility];  Service: General;  Laterality: Right;  . Esophagogastroduodenoscopy N/A 07/10/2014    Procedure: ESOPHAGOGASTRODUODENOSCOPY (EGD);  Surgeon: Garlan Fair, MD;  Location: [facility];  Service: Endoscopy;  Laterality: N/A;  . Hiatal hernia repair N/A 10/03/2014    Procedure: LAPAROSCOPIC REPAIR GIANT HIATAL HERNIA, POSSIBLE OPEN;  Surgeon: Fanny Skates, MD;  Location: [facility];  Service: General;  Laterality: N/A;  . Gastrostomy  N/A 10/03/2014    Procedure: GASTROSTOMY TUBE;  Surgeon: Fanny Skates, MD;  Location: [facility];  Service: General;  Laterality: N/A;     . acetaminophen  1,000 mg Oral TID  . amLODipine  2.5 mg Oral Daily  . antiseptic oral rinse  7 mL Mouth Rinse BID  . bisacodyl  10 mg Rectal Daily  . feeding supplement (ENSURE COMPLETE)  237 mL Oral TID WC  . heparin  5,000 Units Subcutaneous 3 times per day  . lip balm  1 application Topical BID  . lisinopril  10 mg Oral BID  . metoprolol tartrate  25 mg Oral BID  . pantoprazole  40 mg Oral BID AC  . polyethylene glycol  17 g Oral BID  . pravastatin  20 mg Oral q1800  . saccharomyces boulardii  250 mg Oral BID  . sodium chloride  3 mL Intravenous Q12H  . tamsulosin  0.4 mg Oral QPC supper      Physical Exam: Blood pressure 109/69, pulse 67, temperature 98 F (36.7 C), temperature source Oral, resp. rate 18, height 5\' 6"  (1.676 m), weight 60.1 kg (132 lb 7.9 oz), SpO2 98 %.   Affect appropriate Elderly thin male  HEENT: normal Neck supple with no adenopathy JVP normal no bruits no thyromegaly Lungs clear with no wheezing and good diaphragmatic motion Heart:  S1/S2 SEM  murmur, no rub, gallop or click PMI normal Abdomen: S/P laproscopic surgery BS positive not distended  no bruit.  No HSM or HJR Distal pulses intact with no bruits No edema Neuro non-focal Skin warm and dry No muscular weakness   Labs:   Lab Results  Component Value Date   WBC 11.2* 10/07/2014   HGB 11.8* 10/07/2014   HCT 35.6* 10/07/2014   MCV 95.2 10/07/2014   PLT 198 10/07/2014    Recent Labs Lab 10/07/14 0605  NA 139  K 3.9  CL 105  CO2 23  BUN 28*  CREATININE 1.36*  CALCIUM 9.2  GLUCOSE 129*   Lab Results  Component Value Date   TROPONINI <0.30 03/24/2014   No results found for: CHOL No results found for: HDL No results found for: LDLCALC No results found for: TRIG No results found for: CHOLHDL No results found for: LDLDIRECT      Radiology: Dg Chest 2 View  10/07/2014   CLINICAL DATA:  80 year old male with shortness of breath. History of recent hiatal hernia repair.  EXAM: CHEST  2 VIEW  COMPARISON:  Postoperative upper GI study 10/04/2014 ; prior acute abdominal series 4 04/18/2014  FINDINGS: Cardiac and  mediastinal contours are within normal limits. The previously identified large hiatal hernia is no longer evident. Inspiratory volumes are low. There are small bilateral pleural effusions with associated bibasilar atelectasis. The mid and upper lungs remain relatively well aerated. There is a moderate amount of free air under the right hemidiaphragm. This was not clearly seen on 10/04/2014. No pneumothorax. No acute osseous abnormality.  IMPRESSION: 1. Free air under the right hemidiaphragm was not seen on the limited upper GI study dated seen on 10/04/2014. While this may represent redistribution of intra-abdominal air related to the patient's recent surgical intervention, interval perforation of a hollow viscus cannot be excluded radiographically. After discussing with Dr. Excell Seltzer, redistribution of previously present intra-abdominal air related to the prior surgery is favored. The patient is clinically doing well without evidence of abdominal tenderness or leukocytosis. 2. Small bilateral pleural effusions and associated bibasilar atelectasis. These results were called by telephone at the time of interpretation on 10/07/2014 at 2:08 pm to Dr. Excell Seltzer , who verbally acknowledged these results.   Electronically Signed   By: Jacqulynn Cadet M.D.   On: 10/07/2014 14:09   Dg Ugi W/water Sol Cm  10/04/2014   CLINICAL DATA:  One day postop from hiatal hernia repair.  EXAM: WATER SOLUBLE UPPER GI SERIES  TECHNIQUE: Single-column upper GI series was performed using water soluble contrast.  CONTRAST:  140mL OMNIPAQUE IOHEXOL 300 MG/ML  SOLN  COMPARISON:  None.  FLUOROSCOPY TIME:  1 min 43 seconds  FINDINGS: Scout radiograph  shows no evidence of dilated bowel loops.  There is no evidence of esophageal obstruction or postoperative leak. No evidence of residual hiatal hernia. Stomach is incompletely distended but otherwise unremarkable in appearance. Esophageal dysmotility with tertiary contractions noted.  IMPRESSION: No evidence of postop leak or obstruction status post hiatal hernia repair.   Electronically Signed   By: Earle Gell M.D.   On: 10/04/2014 10:55    EKG:  SR PVC otherwise normal ECG   ASSESSMENT AND PLAN:  Arrhythmia:  Benign and asymptomatic  Ok to go to [place] in am Continue beta blocker.  Primarily PAC;s and occasional PVC;s that are asymptomatic No associated symptoms HTN:  Continue amlodipine and ACE  Stable Chol:  Continue statin although not clear of indication in 80 yo with no vascular disease Hernia:  Doing well with BM and taking PO  To [place] in am   Signed: Jenkins Rouge 10/08/2014, 4:51 PM

## 2014-10-08 NOTE — Plan of Care (Signed)
Problem: Phase II Progression Outcomes Goal: Pain controlled Outcome: Completed/Met Date Met:  10/08/14 Goal: Progress activity as tolerated unless otherwise ordered Outcome: Completed/Met Date Met:  10/08/14 Goal: Progressing with IS, TCDB Outcome: Completed/Met Date Met:  10/08/14 Goal: Vital signs stable Outcome: Completed/Met Date Met:  10/08/14 Goal: Surgical site without signs of infection Outcome: Completed/Met Date Met:  10/08/14 Goal: Dressings dry/intact Outcome: Completed/Met Date Met:  10/08/14 Goal: Return of bowel function (flatus, BM) IF ABDOMINAL SURGERY:  Outcome: Completed/Met Date Met:  10/08/14 Goal: Foley discontinued Outcome: Completed/Met Date Met:  10/08/14 Goal: Tolerating diet Outcome: Completed/Met Date Met:  10/08/14

## 2014-10-08 NOTE — Discharge Instructions (Signed)
-  see above 

## 2014-10-08 NOTE — Progress Notes (Signed)
PT Cancellation Note  Patient Details Name: Larry Mcfarland MRN: [id number] DOB: Feb 18, 1921   Cancelled Treatment:    Reason Eval/Treat Not Completed: Other (comment) (patient reports that he walked around the unit already and plans to walk again. will check back later as schedule allows.)   Claretha Cooper 10/08/2014, 12:05 PM Tresa Endo PT [phone]

## 2014-10-08 NOTE — Progress Notes (Signed)
Physical Therapy Treatment Patient Details Name: Larry Mcfarland MRN: [id number] DOB: 1921-03-14 Today's Date: 10/08/2014    History of Present Illness Pt is a 81 year old male s/p lap repair giant hiatal hernia and gastrostomy tube on 10/03/14    PT Comments    Progressing in ambulation   Follow Up Recommendations  SNF     Equipment Recommendations  None recommended by PT    Recommendations for Other Services       Precautions / Restrictions Precautions Precautions: Fall    Mobility  Bed Mobility                  Transfers Overall transfer level: Needs assistance Equipment used: Rolling walker (2 wheeled)   Sit to Stand: Supervision         General transfer comment: noted uop in room to door  with RW alone  Ambulation/Gait Ambulation/Gait assistance: Supervision Ambulation Distance (Feet): 700 Feet Assistive device: Rolling walker (2 wheeled) Gait Pattern/deviations: Step-through pattern     General Gait Details: verbal cues for use of RW, pt reports only "a little" pain, ambulation distance to pt tolerance   Stairs            Wheelchair Mobility    Modified Rankin (Stroke Patients Only)       Balance                                    Cognition Arousal/Alertness: Awake/alert Behavior During Therapy: Agitated (about noone coming to walk with him)                        Exercises      General Comments        Pertinent Vitals/Pain Faces Pain Scale: Hurts even more Pain Location: abd Pain Intervention(s): Monitored during session    Home Living                      Prior Function            PT Goals (current goals can now be found in the care plan section) Progress towards PT goals: Progressing toward goals    Frequency  Min 3X/week    PT Plan Current plan remains appropriate    Co-evaluation             End of Session   Activity Tolerance: Patient tolerated treatment  well Patient left: in chair;with call bell/phone within reach     Time: 1500-1515 PT Time Calculation (min): 15 min  Charges:  $Gait Training: 8-22 mins                    G Codes:      Claretha Cooper 10/08/2014, 5:33 PM

## 2014-10-08 NOTE — Progress Notes (Signed)
5 Days Post-Op  Subjective: Patient alert. Feels fine. No complaints. Next line had a small BM Swallowing reasonably well. No no nausea no bloating no reflux. No dysphagia. Voiding reasonably well. Same as at home. No chest pain or dyspnea. Ambulatory.  Hoping to transfer to [facility] for [facility]-term rehabilitation prior to transfer home Spoke with patient and his son about this this morning. Apparently all plans are in place  Cardiac monitor shows normal sinus rhythm, rate 79, PVCs. Had a 10 beat run of SVT. Was asymptomatic. SPO2 98% on room air Potassium 3.9. Creatinine 1.36. Actually better than baseline. Glucose 129. BNP 2812. WBC 11,200. Hemoglobin 11.3. All stable.  Chest x-ray shows no pneumonia. Small pneumoperitoneum, Felt to be benign postop since patient doing well.  Objective: Vital signs in last 24 hours: Temp:  [97.4 F (36.3 C)-98.4 F (36.9 C)] 98.1 F (36.7 C) (11/09 0456) Pulse Rate:  [72-85] 72 (11/09 0456) Resp:  [16-20] 16 (11/09 0456) BP: (102-130)/(59-75) 118/59 mmHg (11/09 0456) SpO2:  [95 %-97 %] 97 % (11/09 0456) Last BM Date: 10/07/14 ("small")  Intake/Output from previous day: 11/08 0701 - 11/09 0700 In: 1080 [P.O.:1080] Out: 475 [Urine:475] Intake/Output this shift: Total I/O In: 600 [P.O.:600] Out: 225 [Urine:225]  General appearance: alert. Cooperative. Oriented. Mental status normal, at baseline. Resp: clear to auscultation bilaterally GI: abdomen soft. Nondistended. Wounds look good. Gastrostomy tube flushes well.  Lab Results:  No results found for this or any previous visit (from the past 24 hour(s)).   Studies/Results: Dg Chest 2 View  10/07/2014   CLINICAL DATA:  80 year old male with shortness of breath. History of recent hiatal hernia repair.  EXAM: CHEST  2 VIEW  COMPARISON:  Postoperative upper GI study 10/04/2014 ; prior acute abdominal series 4 04/18/2014  FINDINGS: Cardiac and mediastinal contours are within normal limits.  The previously identified large hiatal hernia is no longer evident. Inspiratory volumes are low. There are small bilateral pleural effusions with associated bibasilar atelectasis. The mid and upper lungs remain relatively well aerated. There is a moderate amount of free air under the right hemidiaphragm. This was not clearly seen on 10/04/2014. No pneumothorax. No acute osseous abnormality.  IMPRESSION: 1. Free air under the right hemidiaphragm was not seen on the limited upper GI study dated seen on 10/04/2014. While this may represent redistribution of intra-abdominal air related to the patient's recent surgical intervention, interval perforation of a hollow viscus cannot be excluded radiographically. After discussing with Dr. Excell Seltzer, redistribution of previously present intra-abdominal air related to the prior surgery is favored. The patient is clinically doing well without evidence of abdominal tenderness or leukocytosis. 2. Small bilateral pleural effusions and associated bibasilar atelectasis. These results were called by telephone at the time of interpretation on 10/07/2014 at 2:08 pm to Dr. Excell Seltzer , who verbally acknowledged these results.   Electronically Signed   By: Jacqulynn Cadet M.D.   On: 10/07/2014 14:09    . acetaminophen  1,000 mg Oral TID  . amLODipine  2.5 mg Oral Daily  . antiseptic oral rinse  7 mL Mouth Rinse BID  . bisacodyl  10 mg Rectal Daily  . feeding supplement (ENSURE COMPLETE)  237 mL Oral TID WC  . heparin  5,000 Units Subcutaneous 3 times per day  . lip balm  1 application Topical BID  . lisinopril  10 mg Oral BID  . metoprolol tartrate  25 mg Oral BID  . pantoprazole  40 mg Oral BID AC  .  polyethylene glycol  17 g Oral BID  . pravastatin  20 mg Oral q1800  . saccharomyces boulardii  250 mg Oral BID  . sodium chloride  3 mL Intravenous Q12H  . tamsulosin  0.4 mg Oral QPC supper     Assessment/Plan: s/p Procedure(s): LAPAROSCOPIC REPAIR GIANT  HIATAL HERNIA, POSSIBLE OPEN GASTROSTOMY TUBE  POD#5. Stable From the surgical standpoint he is ready to transfer to [facility] Pain is well controlled by Tylenol, so no prescription for narcotics will be written   Bland, soft diet Flush G-tube with water twice a day Follow-up with me in office, 2 weeks Cardiology consult prior to discharge.  Coronary artery disease, nonsustained SVT, asymptomatic, PVCs. I am not sure whether there is any clinical significance to this, given his age and prior workup with Dr. Roosvelt Harps We will ask for cardiology consultation this morning to make sure that no further workup is indicated.  PT, OT, case management consults. These are completed. Appreciated.  Hypertension.stable on oral meds Chronic kidney disease stage III.  Stable. At baseline. Anxiety BPH.  On Flomax. Voiding well Remote history CAD, underwent preoperative evaluation by Dr. Johnsie Cancel single episode atrial fibrillation in past, currently in NSR with PVC's and non sustained run SVT.  Heart rate and BP stable on low-dose IV Lopressor.     @PROBHOSP @  LOS: 5 days    INGRAM,HAYWOOD M 10/08/2014  . .prob

## 2014-10-09 DIAGNOSIS — N183 Chronic kidney disease, stage 3 (moderate): Secondary | ICD-10-CM | POA: Diagnosis not present

## 2014-10-09 DIAGNOSIS — K219 Gastro-esophageal reflux disease without esophagitis: Secondary | ICD-10-CM | POA: Diagnosis not present

## 2014-10-09 DIAGNOSIS — N400 Enlarged prostate without lower urinary tract symptoms: Secondary | ICD-10-CM | POA: Diagnosis not present

## 2014-10-09 DIAGNOSIS — Z79899 Other long term (current) drug therapy: Secondary | ICD-10-CM | POA: Diagnosis not present

## 2014-10-09 DIAGNOSIS — F419 Anxiety disorder, unspecified: Secondary | ICD-10-CM | POA: Diagnosis not present

## 2014-10-09 DIAGNOSIS — K449 Diaphragmatic hernia without obstruction or gangrene: Secondary | ICD-10-CM | POA: Diagnosis not present

## 2014-10-09 DIAGNOSIS — R5381 Other malaise: Secondary | ICD-10-CM | POA: Diagnosis not present

## 2014-10-09 DIAGNOSIS — I482 Chronic atrial fibrillation: Secondary | ICD-10-CM | POA: Diagnosis not present

## 2014-10-09 DIAGNOSIS — I480 Paroxysmal atrial fibrillation: Secondary | ICD-10-CM | POA: Diagnosis not present

## 2014-10-09 DIAGNOSIS — F411 Generalized anxiety disorder: Secondary | ICD-10-CM | POA: Diagnosis not present

## 2014-10-09 DIAGNOSIS — I10 Essential (primary) hypertension: Secondary | ICD-10-CM | POA: Diagnosis not present

## 2014-10-09 DIAGNOSIS — Z931 Gastrostomy status: Secondary | ICD-10-CM | POA: Diagnosis not present

## 2014-10-09 DIAGNOSIS — Z48815 Encounter for surgical aftercare following surgery on the digestive system: Secondary | ICD-10-CM | POA: Diagnosis not present

## 2014-10-09 DIAGNOSIS — K440 Diaphragmatic hernia with obstruction, without gangrene: Secondary | ICD-10-CM | POA: Diagnosis not present

## 2014-10-09 DIAGNOSIS — M6281 Muscle weakness (generalized): Secondary | ICD-10-CM | POA: Diagnosis not present

## 2014-10-09 DIAGNOSIS — E785 Hyperlipidemia, unspecified: Secondary | ICD-10-CM | POA: Diagnosis not present

## 2014-10-09 DIAGNOSIS — F4323 Adjustment disorder with mixed anxiety and depressed mood: Secondary | ICD-10-CM | POA: Diagnosis not present

## 2014-10-09 DIAGNOSIS — K5900 Constipation, unspecified: Secondary | ICD-10-CM | POA: Diagnosis not present

## 2014-10-09 DIAGNOSIS — D72829 Elevated white blood cell count, unspecified: Secondary | ICD-10-CM | POA: Diagnosis not present

## 2014-10-09 NOTE — Progress Notes (Signed)
Occupational Therapy Treatment Patient Details Name: KYRUS SPRIGG MRN: [id number] DOB: [birth year] Today's Date: 10/09/2014    History of present illness Pt is a 80 year old male s/p lap repair giant hiatal hernia and gastrostomy tube on 10/03/14   OT comments    Follow Up Recommendations  SNF          Precautions / Restrictions Precautions Precautions: Fall       Mobility Bed Mobility               General bed mobility comments: pitn chair  Transfers Overall transfer level: Needs assistance Equipment used: Rolling walker (2 wheeled) Transfers: Sit to/from Stand Sit to Stand: Independent;Supervision                  ADL Overall ADL's : Needs assistance/impaired         Upper Body Bathing: Set up;Sitting   Lower Body Bathing: Moderate assistance;Sit to/from stand               Toileting- Water quality scientist and Hygiene: Supervision/safety;Sit to/from stand       Functional mobility during ADLs: Minimal assistance;Rolling walker General ADL Comments: pt plans to go to rehab  for a few weeks before going back to [facility]. Pt very motivated!                Cognition   Behavior During Therapy: WFL for tasks assessed/performed Overall Cognitive Status: Within Functional Limits for tasks assessed                               General Comments  good participation - ready for rehab     Pertinent Vitals/ Pain       Faces Pain Scale: Hurts a little bit Pain Location: abdomen Pain Descriptors / Indicators: Sore  Home Living                                          Prior Functioning/Environment              Frequency Min 2X/week     Progress Toward Goals  OT Goals(current goals can now be found in the care plan section)  Progress towards OT goals: Progressing toward goals     Plan Discharge plan remains appropriate       End of Session     Activity Tolerance Patient tolerated  treatment well   Patient Left in chair with call bell and chair alarm   Nurse Communication Mobility status        Time: [id number] OT Time Calculation (min): 9 min  Charges: OT General Charges $OT Visit: 1 Procedure OT Treatments $Self Care/Home Management : 8-22 mins  [place], Thereasa Parkin 10/09/2014, 12:43 PM

## 2014-10-09 NOTE — Plan of Care (Signed)
Problem: Phase II Progression Outcomes Goal: Discharge plan established Outcome: Completed/Met Date Met:  10/09/14     

## 2014-10-09 NOTE — Plan of Care (Signed)
Problem: Phase II Progression Outcomes Goal: Sutures/staples intact Outcome: Completed/Met Date Met:  10/09/14

## 2014-10-09 NOTE — Plan of Care (Signed)
Problem: Phase II Progression Outcomes Goal: Other Phase II Outcomes/Goals Outcome: Not Applicable Date Met:  10/09/14     

## 2014-10-09 NOTE — Progress Notes (Signed)
Patient is set to discharge to [facility] today. Patient & son, [name] aware. Discharge packet given to RN, Anderson Malta. [facility] to transport to SNF @ 1:00pm.   [place] NOTE 10/09/2014  Patient:  Larry Mcfarland, Larry Mcfarland  Account Number:  [id number] Admit date:  10/03/2014  Clinical Social Worker:  Ulyess Blossom  Date/time:  10/04/2014 01:30 PM  Clinical Social Work is seeking post-discharge placement for this patient at the following level of care:   SKILLED NURSING   (*CSW will update this form in Epic as items are completed)   10/04/2014  Patient/family provided with [facility] Department of Clinical Social Work's list of facilities offering this level of care within the geographic area requested by the patient (or if unable, by the patient's family).  10/04/2014  Patient/family informed of their freedom to choose among providers that offer the needed level of care, that participate in Medicare, Medicaid or managed care program needed by the patient, have an available bed and are willing to accept the patient.  10/04/2014  Patient/family informed of [facility]' ownership interest in [facility], as well as of the fact that they are under no obligation to receive care at this facility.  PASARR submitted to EDS on 10/04/2014 PASARR number received on 10/04/2014  FL2 transmitted to all facilities in geographic area requested by pt/family on  10/04/2014 FL2 transmitted to all facilities within larger geographic area on   Patient informed that his/her managed care company has contracts with or will negotiate with  certain facilities, including the following:     Patient/family informed of bed offers received:  10/04/2014 Patient chooses bed at [place] Physician recommends and patient chooses bed at    Patient to be transferred to [place] on  10/09/2014 Patient to be transferred to facility by  [facility] wheelchair van Patient and family notified of transfer on 10/09/2014 Name of family member notified:  patient's son at bedside  The following physician request were entered in Epic:   Additional Comments:   Raynaldo Opitz, [place] Social Worker cell #: [phone]

## 2014-10-09 NOTE — Plan of Care (Signed)
Problem: Phase III Progression Outcomes Goal: Activity at appropriate level-compared to baseline (UP IN CHAIR FOR HEMODIALYSIS)  Outcome: Completed/Met Date Met:  10/09/14     

## 2014-10-09 NOTE — Discharge Summary (Signed)
Patient ID: Larry Mcfarland [id number] 80 y.o. 05-25-1921  Admit date: 10/03/2014  Discharge date and time: 10/09/2014  Admitting Physician: Adin Hector  Discharge Physician: Adin Hector  Admission Diagnoses: giant hiatal hernia  Discharge Diagnoses: giant hiatal hernia  Hypertension, benign Chronic kidney disease, stage III BPH without urinary obstruction Arrhythmia, benign and asymptomatic. PACs, PVCs, and short, nonsustained run of SVT.   Operations: Procedure(s): LAPAROSCOPIC REPAIR GIANT HIATAL HERNIA, POSSIBLE OPEN GASTROSTOMY TUBE  Admission Condition: good  Discharged Condition: good  Indication for Admission: The patient is a 80 year old male who returns and wants to have his hiatal hernia repair. He continues to have intermittent obstructive symptoms, but no reflux.He's had at least 2 more attacks. He tried to vomit but could not. The pain resolved after about 5 hours. He has felt fine in between. He state trhat he cannot continue to live like this. Able to swallow and having normal bowel movements. .. Recall that he has been having obstructive symptoms from his giant hiatal hernia. Upper GI showed large type III hiatal hernia but no obstruction. Ultrasound showed a normal gallbladder. EGD showed very large hiatal hernia containing entire stomach except for distal gastric antrum otherwise normal. Healthy mucosa. First and second duodenum was normal. He continues to play golf and is otherwise active. Comorbidities include hypertension. Chronic kidney disease stage III, anxiety, BPH, remote history CAD with cardiac cath by Dr. Daneen Schick in the past, hypertension, single episode of atrial fibrillation in past. Functional status is actually fairly good. independent.   Hospital Course: on the day of admission the patient was taken to the operating room. He underwent laparoscopic reduction and repair of his hiatal hernia. This consisted of 6 stripping the  large hiatal hernia sac, closure of the diaphragm, and gastrostomy tube placement. The surgery and operative findings were otherwise uneventful. Postoperatively the patient progressed slowly but uneventfully. Gastrografin swallow on the  following day looked good without any obstruction or leak. He began liquid diet and advanced to a bland soft diet without difficulty. He had no dysphagia nausea or vomiting. He had a bowel movement or 2. Physical therapy and occupational therapy and case management was involved. As had been planned preop, plans were made for him to go to [facility] for short-term skilled nursing and rehabilitation prior to transitioning back to independent living at home. His son and brother all were here to support the transition. He was maintained on telemetry and he was noted to have PACs and PVCs. On November 8 had one nonsustained run of SVT. Dr. Jenkins Rouge was kind enough to evaluate him and felt this was benign and that no further intervention was warranted from a cardiology standpoint. On the day of discharge, the patient was doing well with diet and activities. His abdomen was soft and nontender. The wounds and the gastrostomy to site looked good. I discussed the care plan with the patient and his son. If he continues to tolerate diet, the gastrostomy tube was separately clamped and hopefully we can remove this in about 6 weeks. There'll be no need for narcotic analgesics as he is tolerating Tylenol and has very little pain. He will continue all his usual medications. Activities were discussed. He will return to see me in the office in 2 weeks. I encouraged him to follow-up with his primary care physician in the next week to 10 days for reassessment of his medical problems.  Consults: cardiology  Significant Diagnostic Studies: Gastrografin upper GI, laboratory blood  tests  Treatments: surgery: laparoscopic repair of giant hiatal hernia, laparoscopic gastrostomy tube  placement  Disposition: Skilled nursing facility  Patient Instructions:    Medication List    TAKE these medications        ALPRAZolam 0.25 MG tablet  Commonly known as:  XANAX  Take 0.125 mg by mouth at bedtime as needed for sleep.     amLODipine 2.5 MG tablet  Commonly known as:  NORVASC  Take 2.5 mg by mouth daily as needed.     carbamide peroxide 6.5 % otic solution  Commonly known as:  DEBROX  Place 5 drops into both ears every 3 (three) months. As needed for ear wax removal.     ENSURE PLUS Liqd  Take 237 mLs by mouth daily.     lisinopril 20 MG tablet  Commonly known as:  PRINIVIL,ZESTRIL  Take 10 mg by mouth 2 (two) times daily.     metoprolol tartrate 25 MG tablet  Commonly known as:  LOPRESSOR  Take 12.5 mg by mouth 2 (two) times daily.     multivitamin with minerals Tabs tablet  Take 0.5 tablets by mouth daily. Centrum Silver     omeprazole 20 MG capsule  Commonly known as:  PRILOSEC  Take 40 mg by mouth every morning.     OVER THE COUNTER MEDICATION  Apply 1 application topically daily as needed (to cover skin on nose when out in the sun.).     pravastatin 20 MG tablet  Commonly known as:  PRAVACHOL  Take 20 mg by mouth at bedtime.     tamsulosin 0.4 MG Caps capsule  Commonly known as:  FLOMAX  Take 0.4 mg by mouth at bedtime.        Activity: activity as tolerated. Ambulate frequently. No lifting more than 20 pounds. No driving for 2 weeks. May shower. May walk up steps. Diet: bland, soft diet Wound Care: change dry dressing on gastrostomy tube site daily. Patient may shower  Follow-up:  With Dr. Dalbert Batman in 2 weeks.  Signed: Edsel Petrin. Dalbert Batman, M.D., FACS General and minimally invasive surgery Breast and Colorectal Surgery  10/09/2014, 6:02 AM

## 2014-10-09 NOTE — Plan of Care (Signed)
Problem: Phase III Progression Outcomes Goal: Pain controlled on oral analgesia Outcome: Completed/Met Date Met:  10/09/14 Goal: Voiding independently Outcome: Completed/Met Date Met:  10/09/14 Goal: IV changed to normal saline lock Outcome: Completed/Met Date Met:  10/09/14

## 2014-10-09 NOTE — Plan of Care (Signed)
Problem: Phase III Progression Outcomes Goal: Nasogastric tube discontinued Outcome: Not Applicable Date Met:  10/09/14     

## 2014-10-09 NOTE — Plan of Care (Signed)
Problem: Phase III Progression Outcomes Goal: Demonstrates TCDB, IS independently Outcome: Completed/Met Date Met:  10/09/14

## 2014-10-09 NOTE — Plan of Care (Signed)
Problem: Phase III Progression Outcomes Goal: Discharge plan remains appropriate-arrangements made Outcome: Completed/Met Date Met:  10/09/14

## 2014-10-09 NOTE — Progress Notes (Signed)
Report called to O'Brien with Binnie Rail RN accepting report.

## 2014-10-11 ENCOUNTER — Non-Acute Institutional Stay (SKILLED_NURSING_FACILITY): Payer: Medicare Other | Admitting: Adult Health

## 2014-10-11 ENCOUNTER — Encounter: Payer: Self-pay | Admitting: Adult Health

## 2014-10-11 DIAGNOSIS — K440 Diaphragmatic hernia with obstruction, without gangrene: Secondary | ICD-10-CM

## 2014-10-11 DIAGNOSIS — F419 Anxiety disorder, unspecified: Secondary | ICD-10-CM

## 2014-10-11 DIAGNOSIS — K5900 Constipation, unspecified: Secondary | ICD-10-CM

## 2014-10-11 DIAGNOSIS — N400 Enlarged prostate without lower urinary tract symptoms: Secondary | ICD-10-CM

## 2014-10-11 DIAGNOSIS — I480 Paroxysmal atrial fibrillation: Secondary | ICD-10-CM | POA: Diagnosis not present

## 2014-10-11 DIAGNOSIS — E785 Hyperlipidemia, unspecified: Secondary | ICD-10-CM | POA: Diagnosis not present

## 2014-10-11 DIAGNOSIS — I10 Essential (primary) hypertension: Secondary | ICD-10-CM | POA: Diagnosis not present

## 2014-10-12 ENCOUNTER — Non-Acute Institutional Stay (SKILLED_NURSING_FACILITY): Payer: Medicare Other | Admitting: Internal Medicine

## 2014-10-12 ENCOUNTER — Encounter: Payer: Self-pay | Admitting: Internal Medicine

## 2014-10-12 DIAGNOSIS — F4323 Adjustment disorder with mixed anxiety and depressed mood: Secondary | ICD-10-CM

## 2014-10-12 DIAGNOSIS — I10 Essential (primary) hypertension: Secondary | ICD-10-CM

## 2014-10-12 DIAGNOSIS — K449 Diaphragmatic hernia without obstruction or gangrene: Secondary | ICD-10-CM | POA: Diagnosis not present

## 2014-10-12 DIAGNOSIS — N400 Enlarged prostate without lower urinary tract symptoms: Secondary | ICD-10-CM

## 2014-10-12 DIAGNOSIS — K5900 Constipation, unspecified: Secondary | ICD-10-CM | POA: Diagnosis not present

## 2014-10-12 DIAGNOSIS — I482 Chronic atrial fibrillation, unspecified: Secondary | ICD-10-CM

## 2014-10-12 DIAGNOSIS — R5381 Other malaise: Secondary | ICD-10-CM

## 2014-10-12 NOTE — Progress Notes (Signed)
Patient ID: Larry Mcfarland, male   DOB: [birth year], 80 y.o.   MRN: [id number]    [facility]  Chief Complaint  Patient presents with  . New Admit To SNF   Allergies  Allergen Reactions  . Penicillin G Other (See Comments)    Upset stomach  . Penicillins Nausea Only    Upset stomach   Code status: DNR  HPI 80 y/o male pt is here for STR post hospital admission from 10/03/14-10/09/14 with giant hiatal hernia. He underwent laproscopic repair and has a g tube placement. He is seen in his room today. He denies any concerns. He had a bowel movement today. He has pmh of afib, anxiety, BPH and HTN  Review of Systems  Constitutional: Negative for fever, chills, malaise/fatigue and diaphoresis.  HENT: Negative for congestion Respiratory: Negative for cough, shortness of breath and wheezing.   Cardiovascular: Negative for chest pain, palpitations, leg swelling.  Gastrointestinal: Negative for heartburn, nausea, vomiting, abdominal pain, diarrhea and constipation.  Genitourinary: Negative for dysuria, urgency, frequency and flank pain.  Musculoskeletal: Negative for back pain, falls Skin: Negative for itching and rash.  Neurological: Negative for dizziness, tingling, focal weakness and headaches.  Psychiatric/Behavioral: Negative for depression   Past Medical History  Diagnosis Date  . Hypertension   . Hyperlipemia   . Inguinal hernia without mention of obstruction or gangrene, unilateral or unspecified, (not specified as recurrent)   . CKD (chronic kidney disease), stage III   . Anxiety   . Osteopenia 2002  . BPH (benign prostatic hyperplasia)   . Osteoarthritis   . Gout   . CAD (coronary artery disease)   . GERD (gastroesophageal reflux disease)   . Vision disturbance   . Lumbar radiculopathy   . Vertigo 1999    negative ct scan head; rare recurrance, last in 2013  . Bleeding from the nose 2009    cautery by Larry Mcfarland  . Cataract    right eye  . Impaired fasting glucose   . Skin cancer of forehead     left (BCE)  . Distal radius fracture 2013    Larry Mcfarland  . Major depressive disorder, single episode, unspecified   . Nosebleed     most recent 05/24/13  to have  cauterized prior to surgery   . Dysrhythmia     hx of atrial fib- 10 years ago   . H/O hiatal hernia   . Anginal pain    Past Surgical History  Procedure Laterality Date  . Tonsillectomy      at age 68 yrs old  . Eye surgery      right eye cataract surgery   . Inguinal hernia repair Right 06/07/2013    Procedure: LAPAROSCOPIC INGUINAL HERNIA REPAIR;  Surgeon: Larry Mcfarland;  Location: [facility];  Service: General;  Laterality: Right;  With Mesh  . Insertion of mesh Right 06/07/2013    Procedure: INSERTION OF MESH;  Surgeon: Larry Mcfarland;  Location: [facility];  Service: General;  Laterality: Right;  . Esophagogastroduodenoscopy N/A 07/10/2014    Procedure: ESOPHAGOGASTRODUODENOSCOPY (EGD);  Surgeon: Larry Mcfarland;  Location: [facility];  Service: Endoscopy;  Laterality: N/A;  . Hiatal hernia repair N/A 10/03/2014    Procedure: LAPAROSCOPIC REPAIR GIANT HIATAL HERNIA, POSSIBLE OPEN;  Surgeon: Larry Mcfarland;  Location: [facility];  Service: General;  Laterality: N/A;  . Gastrostomy N/A 10/03/2014    Procedure: GASTROSTOMY TUBE;  Surgeon: Larry Mcfarland;  Location: [facility];  Service: General;  Laterality: N/A;   Medication reviewed. See MAR  Family History  Problem Relation Age of Onset  . Heart disease Mother   . Heart disease Father   . Cancer Sister     breast    History   Social History  . Marital Status: Widowed    Spouse Name: N/A    Number of Children: N/A  . Years of Education: N/A   Occupational History  . Not on file.   Social History Main Topics  . Smoking status: Never Smoker   . Smokeless tobacco: Never Used  . Alcohol Use: Yes     Comment: occasional  . Drug Use: No  . Sexual Activity: Not on file   Other  Topics Concern  . Not on file   Social History Narrative   Physical exam BP 128/64 mmHg  Pulse 73  Temp(Src) 98 F (36.7 C)  Resp 18  Wt 131 lb 12.8 oz (59.784 kg)  SpO2 95%  General- elderly male in no acute distress Head- atraumatic, normocephalic Eyes- no pallor, no icterus, no discharge Cardiovascular- normal s1,s2, no murmurs Respiratory- bilateral clear to auscultation, no wheeze, no rhonchi, no crackles Abdomen- bowel sounds present, soft, non tender Musculoskeletal- able to move all 4 extremities, no leg edema Neurological- no focal deficit Skin- warm and dry, 5 surgical incision on abdominal wall with glue in place, gastrostomy tube site clean Psychiatry- alert and oriented to person, place and time, normal mood and affect  Labs reviewed CBC Latest Ref Rng 10/07/2014 10/04/2014 10/03/2014  WBC 4.0 - 10.5 K/uL 11.2(H) 14.0(H) 14.4(H)  Hemoglobin 13.0 - 17.0 g/dL 11.8(L) 11.3(L) 10.8(L)  Hematocrit 39.0 - 52.0 % 35.6(L) 33.6(L) 33.0(L)  Platelets 150 - 400 K/uL 198 180 158   CMP Latest Ref Rng 10/07/2014 10/04/2014 10/03/2014  Glucose 70 - 99 mg/dL 129(H) 147(H) 195(H)  BUN 6 - 23 mg/dL 28(H) 29(H) 32(H)  Creatinine 0.50 - 1.35 mg/dL 1.36(H) 1.50(H) 1.62(H)  Sodium 137 - 147 mEq/L 139 139 140  Potassium 3.7 - 5.3 mEq/L 3.9 4.9 4.5  Chloride 96 - 112 mEq/L 105 108 107  CO2 19 - 32 mEq/L 23 22 21   Calcium 8.4 - 10.5 mg/dL 9.2 8.9 8.4  Total Protein 6.0 - 8.3 g/dL - - -  Total Bilirubin 0.3 - 1.2 mg/dL - - -  Alkaline Phos 39 - 117 U/L - - -  AST 0 - 37 U/L - - -  ALT 0 - 53 U/L - - -    Assessment/plan  Physical deconditioning Will have patient work with PT/OT as tolerated to regain strength and restore function.  Fall precautions are in place.  Hiatal hernia  status post laparoscopic repair and G-tube placement. Advance diet as tolerated for now. Skin care. Has f/u with dr Larry Mcfarland  Hypertension  bp stable, continue lisinopril 10 mg bid, lopressor 12.5 mg bid  and Norvasc 2.5 mg daily   bph Continue flomax 0.4 mg daily  Anxiety  Stable, continue Xanax  0.125 mg by mouth daily prn  atrial fibrillation rate controlled. continue Lopressor 12.5 mg bid  Constipation Stable on senna S 2 tabs bid and Colace 100 mg bid at present

## 2014-10-16 DIAGNOSIS — N400 Enlarged prostate without lower urinary tract symptoms: Secondary | ICD-10-CM | POA: Insufficient documentation

## 2014-10-16 DIAGNOSIS — E785 Hyperlipidemia, unspecified: Secondary | ICD-10-CM | POA: Insufficient documentation

## 2014-10-16 NOTE — Progress Notes (Addendum)
Patient ID: Larry Mcfarland, male   DOB: 1920-12-06, 80 y.o.   MRN: [id number]   10/11/14  Facility:  Nursing Home Location:  [place] Room Number: 1202-P LEVEL OF CARE:  SNF (31)   Chief Complaint  Patient presents with  . Hospitalization Follow-up    Hiatal hernia S/P Laparoscopic repair, Hypertension, Anxiety, BPH, Constipation, Hyperlipidemia and Atrial fibrillation    HISTORY OF PRESENT ILLNESS:  This is a 80 year old male who has been admitted to [facility] on 10/09/14 from [facility] with Giant hiatal hernia S/P Laparoscopic repair and G-tube placement. He has been admitted for a short-term rehabilitation.  REASSESSMENT OF ONGOING PROBLEMS:  HTN: Pt 's HTN remains stable.  Denies CP, sob, DOE, pedal edema, headaches, dizziness or visual disturbances.  No complications from the medications currently being used.  Last BP : 120/60  ATRIAL FIBRILLATION: the patients atrial fibrillation remains stable.  The patient denies DOE, tachycardia, orthopnea, transient neurological sx, pedal edema, palpitations, & PNDs.  No complications noted from the medications currently being used.  BPH: The patient's BPH remains stable. Patient denies urinary hesitancy or dribbling. No complications reported from the current medications being used.  ANXIETY: The anxiety remains stable. Patient denies ongoing anxiety or irritability. No complications reported from the medications currently being used.  PAST MEDICAL HISTORY:  Past Medical History  Diagnosis Date  . Hypertension   . Hyperlipemia   . Inguinal hernia without mention of obstruction or gangrene, unilateral or unspecified, (not specified as recurrent)   . CKD (chronic kidney disease), stage III   . Anxiety   . Osteopenia 2002  . BPH (benign prostatic hyperplasia)   . Osteoarthritis   . Gout   . CAD (coronary artery disease)   . GERD (gastroesophageal reflux disease)   . Vision  disturbance   . Lumbar radiculopathy   . Vertigo 1999    negative ct scan head; rare recurrance, last in 2013  . Bleeding from the nose 2009    cautery by Dr. Erik Obey  . Cataract     right eye  . Impaired fasting glucose   . Skin cancer of forehead     left (BCE)  . Distal radius fracture 2013    Dr. Fredna Dow  . Major depressive disorder, single episode, unspecified   . Nosebleed     most recent 05/24/13  to have  cauterized prior to surgery   . Dysrhythmia     hx of atrial fib- 10 years ago   . H/O hiatal hernia   . Anginal pain     CURRENT MEDICATIONS: Reviewed per MAR/see medication list  Allergies  Allergen Reactions  . Penicillin G Other (See Comments)    Upset stomach  . Penicillins Nausea Only    Upset stomach     REVIEW OF SYSTEMS:  GENERAL: no change in appetite, no fatigue, no weight changes, no fever, chills or weakness RESPIRATORY: no cough, SOB, DOE, wheezing, hemoptysis CARDIAC: no chest pain, edema or palpitations GI: no abdominal pain, diarrhea, heart burn, nausea or vomiting, +constipation  PHYSICAL EXAMINATION  GENERAL: no acute distress, normal body habitus EYES: conjunctivae normal, sclerae normal, normal eye lids NECK: supple, trachea midline, no neck masses, no thyroid tenderness, no thyromegaly LYMPHATICS: no LAN in the neck, no supraclavicular LAN RESPIRATORY: breathing is even & unlabored, BS CTAB CARDIAC: RRR, no murmur,no extra heart sounds, no edema GI: abdomen soft, normal BS, no masses, no tenderness, no hepatomegaly, no splenomegaly  Noted 5 laparoscopic sites on abdomen, g-tube on LLQ clamped EXTREMITIES: able to move all 4 extremities; walks with walker PSYCHIATRIC: the patient is alert & oriented to person, affect & behavior appropriate  LABS/RADIOLOGY: Labs reviewed: Basic Metabolic Panel:  Recent Labs  10/03/14 1142 10/04/14 0335 10/07/14 0605  NA 140 139 139  K 4.5 4.9 3.9  CL 107 108 105  CO2 21 22 23   GLUCOSE 195*  147* 129*  BUN 32* 29* 28*  CREATININE 1.62* 1.50* 1.36*  CALCIUM 8.4 8.9 9.2   Liver Function Tests:  Recent Labs  03/24/14 2118 10/01/14 0915  AST 25 21  ALT 17 15  ALKPHOS 81 75  BILITOT 0.3 0.3  PROT 6.8 6.7  ALBUMIN 3.8 3.8    Recent Labs  03/24/14 2118  LIPASE 42   CBC:  Recent Labs  03/24/14 2118 10/01/14 0915 10/03/14 1142 10/04/14 0335 10/07/14 0605  WBC 10.6* 5.9 14.4* 14.0* 11.2*  NEUTROABS 9.2* 3.8  --   --   --   HGB 12.5* 12.1* 10.8* 11.3* 11.8*  HCT 37.8* 36.5* 33.0* 33.6* 35.6*  MCV 98.4 95.8 97.1 95.2 95.2  PLT 161 206 158 180 198   Cardiac Enzymes:  Recent Labs  03/24/14 2118  TROPONINI <0.30    Dg Chest 2 View  10/07/2014   CLINICAL DATA:  80 year old male with shortness of breath. History of recent hiatal hernia repair.  EXAM: CHEST  2 VIEW  COMPARISON:  Postoperative upper GI study 10/04/2014 ; prior acute abdominal series 4 04/18/2014  FINDINGS: Cardiac and mediastinal contours are within normal limits. The previously identified large hiatal hernia is no longer evident. Inspiratory volumes are low. There are small bilateral pleural effusions with associated bibasilar atelectasis. The mid and upper lungs remain relatively well aerated. There is a moderate amount of free air under the right hemidiaphragm. This was not clearly seen on 10/04/2014. No pneumothorax. No acute osseous abnormality.  IMPRESSION: 1. Free air under the right hemidiaphragm was not seen on the limited upper GI study dated seen on 10/04/2014. While this may represent redistribution of intra-abdominal air related to the patient's recent surgical intervention, interval perforation of a hollow viscus cannot be excluded radiographically. After discussing with Dr. Excell Seltzer, redistribution of previously present intra-abdominal air related to the prior surgery is favored. The patient is clinically doing well without evidence of abdominal tenderness or leukocytosis. 2. Small bilateral  pleural effusions and associated bibasilar atelectasis. These results were called by telephone at the time of interpretation on 10/07/2014 at 2:08 pm to Dr. Excell Seltzer , who verbally acknowledged these results.   Electronically Signed   By: Jacqulynn Cadet M.D.   On: 10/07/2014 14:09   Dg Ugi W/water Sol Cm  10/04/2014   CLINICAL DATA:  One day postop from hiatal hernia repair.  EXAM: WATER SOLUBLE UPPER GI SERIES  TECHNIQUE: Single-column upper GI series was performed using water soluble contrast.  CONTRAST:  177mL OMNIPAQUE IOHEXOL 300 MG/ML  SOLN  COMPARISON:  None.  FLUOROSCOPY TIME:  1 min 43 seconds  FINDINGS: Scout radiograph shows no evidence of dilated bowel loops.  There is no evidence of esophageal obstruction or postoperative leak. No evidence of residual hiatal hernia. Stomach is incompletely distended but otherwise unremarkable in appearance. Esophageal dysmotility with tertiary contractions noted.  IMPRESSION: No evidence of postop leak or obstruction status post hiatal hernia repair.   Electronically Signed   By: Earle Gell M.D.   On: 10/04/2014 10:55  ASSESSMENT/PLAN:  Hiatal hernia status post laparoscopic repair and G-tube placement - if he continues to tolerate diet, then gastrostomy tube can be removed in 6 weeks; follow-up with Dr. Fanny Skates Hypertension - well controlled; continue lisinopril 10 mg by mouth twice a day, Norvasc 2.5 mg by mouth daily and Lopressor 12.5 mg by mouth twice a day Anxiety - stable; continue Xanax  0.125 mg by mouth daily at bedtime when necessary BPH - stable; continue Flomax 0.4 mg by mouth daily at bedtime Hyperlipidemia - continue Lipitor 10 mg by mouth daily Paroxysmal atrial fibrillation - rate controlled; continue Lopressor 12.5 mg by mouth twice a day Constipation - start senna S 2 tabs by mouth twice a day 4 days then daily at bedtime and Colace 100 mg by mouth twice a day   Spent 50 minutes in patient care.       [facility], NP [employer] [phone]

## 2014-10-19 ENCOUNTER — Inpatient Hospital Stay: Admit: 2014-10-19 | Payer: Self-pay | Admitting: General Surgery

## 2014-10-19 SURGERY — REPAIR, HERNIA, HIATAL, LAPAROSCOPIC
Anesthesia: General

## 2014-10-22 ENCOUNTER — Other Ambulatory Visit (INDEPENDENT_AMBULATORY_CARE_PROVIDER_SITE_OTHER): Payer: Self-pay

## 2014-10-22 ENCOUNTER — Non-Acute Institutional Stay (SKILLED_NURSING_FACILITY): Payer: Medicare Other | Admitting: Adult Health

## 2014-10-22 ENCOUNTER — Encounter: Payer: Self-pay | Admitting: Adult Health

## 2014-10-22 DIAGNOSIS — E785 Hyperlipidemia, unspecified: Secondary | ICD-10-CM | POA: Diagnosis not present

## 2014-10-22 DIAGNOSIS — I10 Essential (primary) hypertension: Secondary | ICD-10-CM | POA: Diagnosis not present

## 2014-10-22 DIAGNOSIS — F419 Anxiety disorder, unspecified: Secondary | ICD-10-CM

## 2014-10-22 DIAGNOSIS — K440 Diaphragmatic hernia with obstruction, without gangrene: Secondary | ICD-10-CM | POA: Diagnosis not present

## 2014-10-22 DIAGNOSIS — R5381 Other malaise: Secondary | ICD-10-CM | POA: Diagnosis not present

## 2014-10-22 DIAGNOSIS — N400 Enlarged prostate without lower urinary tract symptoms: Secondary | ICD-10-CM

## 2014-10-22 DIAGNOSIS — K449 Diaphragmatic hernia without obstruction or gangrene: Secondary | ICD-10-CM

## 2014-10-24 DIAGNOSIS — Z431 Encounter for attention to gastrostomy: Secondary | ICD-10-CM | POA: Diagnosis not present

## 2014-10-24 DIAGNOSIS — F419 Anxiety disorder, unspecified: Secondary | ICD-10-CM | POA: Diagnosis not present

## 2014-10-24 DIAGNOSIS — I129 Hypertensive chronic kidney disease with stage 1 through stage 4 chronic kidney disease, or unspecified chronic kidney disease: Secondary | ICD-10-CM | POA: Diagnosis not present

## 2014-10-24 DIAGNOSIS — Z48815 Encounter for surgical aftercare following surgery on the digestive system: Secondary | ICD-10-CM | POA: Diagnosis not present

## 2014-10-24 DIAGNOSIS — I2510 Atherosclerotic heart disease of native coronary artery without angina pectoris: Secondary | ICD-10-CM | POA: Diagnosis not present

## 2014-10-24 DIAGNOSIS — N183 Chronic kidney disease, stage 3 (moderate): Secondary | ICD-10-CM | POA: Diagnosis not present

## 2014-11-01 ENCOUNTER — Encounter (INDEPENDENT_AMBULATORY_CARE_PROVIDER_SITE_OTHER): Payer: Self-pay

## 2014-11-01 ENCOUNTER — Ambulatory Visit
Admission: RE | Admit: 2014-11-01 | Discharge: 2014-11-01 | Disposition: A | Discharge status: Home / Self Care | Payer: Medicare Other | Source: Ambulatory Visit | Attending: General Surgery | Admitting: General Surgery

## 2014-11-01 DIAGNOSIS — Z4682 Encounter for fitting and adjustment of non-vascular catheter: Secondary | ICD-10-CM | POA: Diagnosis not present

## 2014-11-01 DIAGNOSIS — K449 Diaphragmatic hernia without obstruction or gangrene: Secondary | ICD-10-CM | POA: Diagnosis not present

## 2014-11-01 DIAGNOSIS — K219 Gastro-esophageal reflux disease without esophagitis: Secondary | ICD-10-CM | POA: Diagnosis not present

## 2014-11-12 ENCOUNTER — Other Ambulatory Visit: Payer: Medicare Other

## 2014-11-27 DIAGNOSIS — R279 Unspecified lack of coordination: Secondary | ICD-10-CM | POA: Diagnosis not present

## 2014-11-27 DIAGNOSIS — M6281 Muscle weakness (generalized): Secondary | ICD-10-CM | POA: Diagnosis not present

## 2014-11-27 DIAGNOSIS — R260 Ataxic gait: Secondary | ICD-10-CM | POA: Diagnosis not present

## 2014-11-28 DIAGNOSIS — R260 Ataxic gait: Secondary | ICD-10-CM | POA: Diagnosis not present

## 2014-11-28 DIAGNOSIS — M6281 Muscle weakness (generalized): Secondary | ICD-10-CM | POA: Diagnosis not present

## 2014-11-28 DIAGNOSIS — R279 Unspecified lack of coordination: Secondary | ICD-10-CM | POA: Diagnosis not present

## 2014-11-29 DIAGNOSIS — R279 Unspecified lack of coordination: Secondary | ICD-10-CM | POA: Diagnosis not present

## 2014-11-29 DIAGNOSIS — M6281 Muscle weakness (generalized): Secondary | ICD-10-CM | POA: Diagnosis not present

## 2014-11-29 DIAGNOSIS — R260 Ataxic gait: Secondary | ICD-10-CM | POA: Diagnosis not present

## 2014-12-04 DIAGNOSIS — R279 Unspecified lack of coordination: Secondary | ICD-10-CM | POA: Diagnosis not present

## 2014-12-04 DIAGNOSIS — M6281 Muscle weakness (generalized): Secondary | ICD-10-CM | POA: Diagnosis not present

## 2014-12-04 DIAGNOSIS — R260 Ataxic gait: Secondary | ICD-10-CM | POA: Diagnosis not present

## 2014-12-05 DIAGNOSIS — R260 Ataxic gait: Secondary | ICD-10-CM | POA: Diagnosis not present

## 2014-12-05 DIAGNOSIS — R279 Unspecified lack of coordination: Secondary | ICD-10-CM | POA: Diagnosis not present

## 2014-12-05 DIAGNOSIS — M6281 Muscle weakness (generalized): Secondary | ICD-10-CM | POA: Diagnosis not present

## 2014-12-06 DIAGNOSIS — R351 Nocturia: Secondary | ICD-10-CM | POA: Diagnosis not present

## 2014-12-07 DIAGNOSIS — R260 Ataxic gait: Secondary | ICD-10-CM | POA: Diagnosis not present

## 2014-12-07 DIAGNOSIS — M6281 Muscle weakness (generalized): Secondary | ICD-10-CM | POA: Diagnosis not present

## 2014-12-07 DIAGNOSIS — R279 Unspecified lack of coordination: Secondary | ICD-10-CM | POA: Diagnosis not present

## 2014-12-10 DIAGNOSIS — R351 Nocturia: Secondary | ICD-10-CM | POA: Diagnosis not present

## 2014-12-10 DIAGNOSIS — I129 Hypertensive chronic kidney disease with stage 1 through stage 4 chronic kidney disease, or unspecified chronic kidney disease: Secondary | ICD-10-CM | POA: Diagnosis not present

## 2014-12-10 DIAGNOSIS — I10 Essential (primary) hypertension: Secondary | ICD-10-CM | POA: Diagnosis not present

## 2014-12-10 DIAGNOSIS — Z9889 Other specified postprocedural states: Secondary | ICD-10-CM | POA: Diagnosis not present

## 2014-12-10 DIAGNOSIS — F419 Anxiety disorder, unspecified: Secondary | ICD-10-CM | POA: Diagnosis not present

## 2014-12-12 DIAGNOSIS — R279 Unspecified lack of coordination: Secondary | ICD-10-CM | POA: Diagnosis not present

## 2014-12-12 DIAGNOSIS — M6281 Muscle weakness (generalized): Secondary | ICD-10-CM | POA: Diagnosis not present

## 2014-12-12 DIAGNOSIS — R260 Ataxic gait: Secondary | ICD-10-CM | POA: Diagnosis not present

## 2014-12-13 DIAGNOSIS — R279 Unspecified lack of coordination: Secondary | ICD-10-CM | POA: Diagnosis not present

## 2014-12-13 DIAGNOSIS — R260 Ataxic gait: Secondary | ICD-10-CM | POA: Diagnosis not present

## 2014-12-13 DIAGNOSIS — M6281 Muscle weakness (generalized): Secondary | ICD-10-CM | POA: Diagnosis not present

## 2014-12-14 DIAGNOSIS — M6281 Muscle weakness (generalized): Secondary | ICD-10-CM | POA: Diagnosis not present

## 2014-12-14 DIAGNOSIS — R260 Ataxic gait: Secondary | ICD-10-CM | POA: Diagnosis not present

## 2014-12-14 DIAGNOSIS — R279 Unspecified lack of coordination: Secondary | ICD-10-CM | POA: Diagnosis not present

## 2014-12-17 DIAGNOSIS — R260 Ataxic gait: Secondary | ICD-10-CM | POA: Diagnosis not present

## 2014-12-17 DIAGNOSIS — M6281 Muscle weakness (generalized): Secondary | ICD-10-CM | POA: Diagnosis not present

## 2014-12-17 DIAGNOSIS — R279 Unspecified lack of coordination: Secondary | ICD-10-CM | POA: Diagnosis not present

## 2014-12-19 DIAGNOSIS — R260 Ataxic gait: Secondary | ICD-10-CM | POA: Diagnosis not present

## 2014-12-19 DIAGNOSIS — R279 Unspecified lack of coordination: Secondary | ICD-10-CM | POA: Diagnosis not present

## 2014-12-19 DIAGNOSIS — M6281 Muscle weakness (generalized): Secondary | ICD-10-CM | POA: Diagnosis not present

## 2014-12-20 DIAGNOSIS — R260 Ataxic gait: Secondary | ICD-10-CM | POA: Diagnosis not present

## 2014-12-20 DIAGNOSIS — R279 Unspecified lack of coordination: Secondary | ICD-10-CM | POA: Diagnosis not present

## 2014-12-20 DIAGNOSIS — M6281 Muscle weakness (generalized): Secondary | ICD-10-CM | POA: Diagnosis not present

## 2014-12-21 DIAGNOSIS — R279 Unspecified lack of coordination: Secondary | ICD-10-CM | POA: Diagnosis not present

## 2014-12-21 DIAGNOSIS — M6281 Muscle weakness (generalized): Secondary | ICD-10-CM | POA: Diagnosis not present

## 2014-12-21 DIAGNOSIS — R260 Ataxic gait: Secondary | ICD-10-CM | POA: Diagnosis not present

## 2014-12-24 DIAGNOSIS — M6281 Muscle weakness (generalized): Secondary | ICD-10-CM | POA: Diagnosis not present

## 2014-12-24 DIAGNOSIS — R260 Ataxic gait: Secondary | ICD-10-CM | POA: Diagnosis not present

## 2014-12-24 DIAGNOSIS — R279 Unspecified lack of coordination: Secondary | ICD-10-CM | POA: Diagnosis not present

## 2014-12-25 DIAGNOSIS — R279 Unspecified lack of coordination: Secondary | ICD-10-CM | POA: Diagnosis not present

## 2014-12-25 DIAGNOSIS — R260 Ataxic gait: Secondary | ICD-10-CM | POA: Diagnosis not present

## 2014-12-25 DIAGNOSIS — M6281 Muscle weakness (generalized): Secondary | ICD-10-CM | POA: Diagnosis not present

## 2014-12-26 DIAGNOSIS — R279 Unspecified lack of coordination: Secondary | ICD-10-CM | POA: Diagnosis not present

## 2014-12-26 DIAGNOSIS — R260 Ataxic gait: Secondary | ICD-10-CM | POA: Diagnosis not present

## 2014-12-26 DIAGNOSIS — M6281 Muscle weakness (generalized): Secondary | ICD-10-CM | POA: Diagnosis not present

## 2014-12-27 DIAGNOSIS — M6281 Muscle weakness (generalized): Secondary | ICD-10-CM | POA: Diagnosis not present

## 2014-12-27 DIAGNOSIS — R279 Unspecified lack of coordination: Secondary | ICD-10-CM | POA: Diagnosis not present

## 2014-12-27 DIAGNOSIS — R260 Ataxic gait: Secondary | ICD-10-CM | POA: Diagnosis not present

## 2014-12-28 DIAGNOSIS — R279 Unspecified lack of coordination: Secondary | ICD-10-CM | POA: Diagnosis not present

## 2014-12-28 DIAGNOSIS — R260 Ataxic gait: Secondary | ICD-10-CM | POA: Diagnosis not present

## 2014-12-28 DIAGNOSIS — M6281 Muscle weakness (generalized): Secondary | ICD-10-CM | POA: Diagnosis not present

## 2014-12-31 DIAGNOSIS — M6281 Muscle weakness (generalized): Secondary | ICD-10-CM | POA: Diagnosis not present

## 2014-12-31 DIAGNOSIS — R279 Unspecified lack of coordination: Secondary | ICD-10-CM | POA: Diagnosis not present

## 2014-12-31 DIAGNOSIS — R260 Ataxic gait: Secondary | ICD-10-CM | POA: Diagnosis not present

## 2015-01-01 DIAGNOSIS — M6281 Muscle weakness (generalized): Secondary | ICD-10-CM | POA: Diagnosis not present

## 2015-01-01 DIAGNOSIS — C44311 Basal cell carcinoma of skin of nose: Secondary | ICD-10-CM | POA: Diagnosis not present

## 2015-01-01 DIAGNOSIS — R279 Unspecified lack of coordination: Secondary | ICD-10-CM | POA: Diagnosis not present

## 2015-01-01 DIAGNOSIS — R260 Ataxic gait: Secondary | ICD-10-CM | POA: Diagnosis not present

## 2015-01-02 DIAGNOSIS — M6281 Muscle weakness (generalized): Secondary | ICD-10-CM | POA: Diagnosis not present

## 2015-01-02 DIAGNOSIS — R279 Unspecified lack of coordination: Secondary | ICD-10-CM | POA: Diagnosis not present

## 2015-01-02 DIAGNOSIS — R260 Ataxic gait: Secondary | ICD-10-CM | POA: Diagnosis not present

## 2015-01-03 DIAGNOSIS — R260 Ataxic gait: Secondary | ICD-10-CM | POA: Diagnosis not present

## 2015-01-03 DIAGNOSIS — R279 Unspecified lack of coordination: Secondary | ICD-10-CM | POA: Diagnosis not present

## 2015-01-03 DIAGNOSIS — M6281 Muscle weakness (generalized): Secondary | ICD-10-CM | POA: Diagnosis not present

## 2015-01-04 DIAGNOSIS — R260 Ataxic gait: Secondary | ICD-10-CM | POA: Diagnosis not present

## 2015-01-04 DIAGNOSIS — R279 Unspecified lack of coordination: Secondary | ICD-10-CM | POA: Diagnosis not present

## 2015-01-04 DIAGNOSIS — M6281 Muscle weakness (generalized): Secondary | ICD-10-CM | POA: Diagnosis not present

## 2015-01-07 DIAGNOSIS — R279 Unspecified lack of coordination: Secondary | ICD-10-CM | POA: Diagnosis not present

## 2015-01-07 DIAGNOSIS — M6281 Muscle weakness (generalized): Secondary | ICD-10-CM | POA: Diagnosis not present

## 2015-01-07 DIAGNOSIS — R260 Ataxic gait: Secondary | ICD-10-CM | POA: Diagnosis not present

## 2015-01-09 DIAGNOSIS — M6281 Muscle weakness (generalized): Secondary | ICD-10-CM | POA: Diagnosis not present

## 2015-01-09 DIAGNOSIS — R260 Ataxic gait: Secondary | ICD-10-CM | POA: Diagnosis not present

## 2015-01-09 DIAGNOSIS — R279 Unspecified lack of coordination: Secondary | ICD-10-CM | POA: Diagnosis not present

## 2015-01-16 DIAGNOSIS — M6281 Muscle weakness (generalized): Secondary | ICD-10-CM | POA: Diagnosis not present

## 2015-01-16 DIAGNOSIS — R260 Ataxic gait: Secondary | ICD-10-CM | POA: Diagnosis not present

## 2015-01-16 DIAGNOSIS — R279 Unspecified lack of coordination: Secondary | ICD-10-CM | POA: Diagnosis not present

## 2015-04-01 DIAGNOSIS — R351 Nocturia: Secondary | ICD-10-CM | POA: Diagnosis not present

## 2015-04-01 DIAGNOSIS — M545 Low back pain: Secondary | ICD-10-CM | POA: Diagnosis not present

## 2015-04-01 DIAGNOSIS — F419 Anxiety disorder, unspecified: Secondary | ICD-10-CM | POA: Diagnosis not present

## 2015-04-01 DIAGNOSIS — E780 Pure hypercholesterolemia: Secondary | ICD-10-CM | POA: Diagnosis not present

## 2015-04-01 DIAGNOSIS — N183 Chronic kidney disease, stage 3 (moderate): Secondary | ICD-10-CM | POA: Diagnosis not present

## 2015-04-01 DIAGNOSIS — I10 Essential (primary) hypertension: Secondary | ICD-10-CM | POA: Diagnosis not present

## 2015-04-01 DIAGNOSIS — E441 Mild protein-calorie malnutrition: Secondary | ICD-10-CM | POA: Diagnosis not present

## 2015-04-07 NOTE — Progress Notes (Signed)
Patient ID: Larry Mcfarland, male   DOB: 02/01/1921, 81 y.o.   MRN: [id number]   10/22/14  Facility:  Nursing Home Location:  [place] Room Number: 1202-P LEVEL OF CARE:  SNF (31)   Chief Complaint  Patient presents with  . Discharge Note    Hiatal hernia S/P Laparoscopic repair, Hypertension, Anxiety, BPH, Constipation, Hyperlipidemia and Atrial fibrillation    HISTORY OF PRESENT ILLNESS:  This is a 81 year old male who is for discharge home with [facility] for post surgical care, PT for balance, functional mobility with least restrictive device and OT for self care and high level independent ADLs . She has been admitted to [facility] on 10/09/14 from [facility] with Giant hiatal hernia S/P Laparoscopic repair and G-tube placement.   Patient was admitted to this facility for short-term rehabilitation after the patient's recent hospitalization.  Patient has completed SNF rehabilitation and therapy has cleared the patient for discharge.  REASSESSMENT OF ONGOING PROBLEMS:  HTN: Pt 's HTN remains stable.  Denies CP, sob, DOE, pedal edema, headaches, dizziness or visual disturbances.  No complications from the medications currently being used.  Last BP : 113/60  ATRIAL FIBRILLATION: the patients atrial fibrillation remains stable.  The patient denies DOE, tachycardia, orthopnea, transient neurological sx, pedal edema, palpitations, & PNDs.  No complications noted from the medications currently being used.  BPH: The patient's BPH remains stable. Patient denies urinary hesitancy or dribbling. No complications reported from the current medications being used.  ANXIETY: The anxiety remains stable. Patient denies ongoing anxiety or irritability. No complications reported from the medications currently being used.  PAST MEDICAL HISTORY:  Past Medical History  Diagnosis Date  . Hypertension   . Hyperlipemia   . Inguinal hernia without  mention of obstruction or gangrene, unilateral or unspecified, (not specified as recurrent)   . CKD (chronic kidney disease), stage III   . Anxiety   . Osteopenia 2002  . BPH (benign prostatic hyperplasia)   . Osteoarthritis   . Gout   . CAD (coronary artery disease)   . GERD (gastroesophageal reflux disease)   . Vision disturbance   . Lumbar radiculopathy   . Vertigo 1999    negative ct scan head; rare recurrance, last in 2013  . Bleeding from the nose 2009    cautery by Dr. Erik Obey  . Cataract     right eye  . Impaired fasting glucose   . Skin cancer of forehead     left (BCE)  . Distal radius fracture 2013    Dr. Fredna Dow  . Major depressive disorder, single episode, unspecified   . Nosebleed     most recent 05/24/13  to have  cauterized prior to surgery   . Dysrhythmia     hx of atrial fib- 10 years ago   . H/O hiatal hernia   . Anginal pain     CURRENT MEDICATIONS: Reviewed per MAR/see medication list  Allergies  Allergen Reactions  . Penicillin G Other (See Comments)    Upset stomach  . Penicillins Nausea Only    Upset stomach     REVIEW OF SYSTEMS:  GENERAL: no change in appetite, no fatigue, no weight changes, no fever, chills or weakness RESPIRATORY: no cough, SOB, DOE, wheezing, hemoptysis CARDIAC: no chest pain, edema or palpitations GI: no abdominal pain, diarrhea, heart burn, nausea or vomiting, +constipation  PHYSICAL EXAMINATION  GENERAL: no acute distress, normal body habitus NECK: supple, trachea midline,  no neck masses, no thyroid tenderness, no thyromegaly LYMPHATICS: no LAN in the neck, no supraclavicular LAN RESPIRATORY: breathing is even & unlabored, BS CTAB CARDIAC: RRR, no murmur,no extra heart sounds, no edema GI: abdomen soft, normal BS, no masses, no tenderness, no hepatomegaly, no splenomegaly Noted 5 laparoscopic sites on abdomen, g-tube on LLQ clamped EXTREMITIES: able to move all 4 extremities; walks with walker PSYCHIATRIC: the  patient is alert & oriented to person, affect & behavior appropriate  LABS/RADIOLOGY: Labs reviewed: Basic Metabolic Panel:  Recent Labs  10/03/14 1142 10/04/14 0335 10/07/14 0605  NA 140 139 139  K 4.5 4.9 3.9  CL 107 108 105  CO2 21 22 23   GLUCOSE 195* 147* 129*  BUN 32* 29* 28*  CREATININE 1.62* 1.50* 1.36*  CALCIUM 8.4 8.9 9.2   Liver Function Tests:  Recent Labs  10/01/14 0915  AST 21  ALT 15  ALKPHOS 75  BILITOT 0.3  PROT 6.7  ALBUMIN 3.8   No results for input(s): LIPASE, AMYLASE in the last 8760 hours. CBC:  Recent Labs  10/01/14 0915 10/03/14 1142 10/04/14 0335 10/07/14 0605  WBC 5.9 14.4* 14.0* 11.2*  NEUTROABS 3.8  --   --   --   HGB 12.1* 10.8* 11.3* 11.8*  HCT 36.5* 33.0* 33.6* 35.6*  MCV 95.8 97.1 95.2 95.2  PLT 206 158 180 198   Cardiac Enzymes: No results for input(s): CKTOTAL, CKMB, CKMBINDEX, TROPONINI in the last 8760 hours.  No results found.  ASSESSMENT/PLAN:  Physical deconditioning- for Home health PT, OT and Nursing Hiatal hernia status post laparoscopic repair and G-tube placement -  follow-up with Dr. Fanny Skates, surgery; for removal of gastrostomy tube after a barrium swallow in 2-3 weeks Hypertension - well controlled; continue lisinopril 10 mg by mouth twice a day, Norvasc 2.5 mg by mouth daily and Lopressor 12.5 mg by mouth twice a day Anxiety - stable; continue Xanax  0.125 mg by mouth daily at bedtime when necessary BPH - stable; continue Flomax 0.4 mg by mouth daily at bedtime Hyperlipidemia - continue Lipitor 10 mg by mouth daily Paroxysmal atrial fibrillation - rate controlled; continue Lopressor 12.5 mg by mouth twice a day Constipation - start senna S 2 tabs by mouth at bedtime and Colace 100 mg by mouth twice a day     I have filled out patient's discharge paperwork and written prescriptions.  Patient will receive home health PT, OT and Nursing.  Total discharge time: Less than 30 minutes  Discharge time  involved coordination of the discharge process with Education officer, museum, nursing staff and therapy department. Medical justification for home health services verified.      [facility], NP [employer] [phone]

## 2015-05-14 DIAGNOSIS — M5136 Other intervertebral disc degeneration, lumbar region: Secondary | ICD-10-CM | POA: Diagnosis not present

## 2015-05-14 DIAGNOSIS — M25511 Pain in right shoulder: Secondary | ICD-10-CM | POA: Diagnosis not present

## 2015-05-14 DIAGNOSIS — M419 Scoliosis, unspecified: Secondary | ICD-10-CM | POA: Diagnosis not present

## 2015-05-14 DIAGNOSIS — M5030 Other cervical disc degeneration, unspecified cervical region: Secondary | ICD-10-CM | POA: Diagnosis not present

## 2015-05-20 DIAGNOSIS — M542 Cervicalgia: Secondary | ICD-10-CM | POA: Diagnosis not present

## 2015-05-20 DIAGNOSIS — M545 Low back pain: Secondary | ICD-10-CM | POA: Diagnosis not present

## 2015-05-22 DIAGNOSIS — M542 Cervicalgia: Secondary | ICD-10-CM | POA: Diagnosis not present

## 2015-05-22 DIAGNOSIS — M545 Low back pain: Secondary | ICD-10-CM | POA: Diagnosis not present

## 2015-05-24 DIAGNOSIS — M542 Cervicalgia: Secondary | ICD-10-CM | POA: Diagnosis not present

## 2015-05-24 DIAGNOSIS — M545 Low back pain: Secondary | ICD-10-CM | POA: Diagnosis not present

## 2015-05-27 DIAGNOSIS — M542 Cervicalgia: Secondary | ICD-10-CM | POA: Diagnosis not present

## 2015-05-27 DIAGNOSIS — M545 Low back pain: Secondary | ICD-10-CM | POA: Diagnosis not present

## 2015-05-29 DIAGNOSIS — M542 Cervicalgia: Secondary | ICD-10-CM | POA: Diagnosis not present

## 2015-05-29 DIAGNOSIS — M545 Low back pain: Secondary | ICD-10-CM | POA: Diagnosis not present

## 2015-05-31 DIAGNOSIS — M545 Low back pain: Secondary | ICD-10-CM | POA: Diagnosis not present

## 2015-05-31 DIAGNOSIS — M542 Cervicalgia: Secondary | ICD-10-CM | POA: Diagnosis not present

## 2015-06-04 DIAGNOSIS — M545 Low back pain: Secondary | ICD-10-CM | POA: Diagnosis not present

## 2015-06-04 DIAGNOSIS — M542 Cervicalgia: Secondary | ICD-10-CM | POA: Diagnosis not present

## 2015-06-06 DIAGNOSIS — M545 Low back pain: Secondary | ICD-10-CM | POA: Diagnosis not present

## 2015-06-06 DIAGNOSIS — M542 Cervicalgia: Secondary | ICD-10-CM | POA: Diagnosis not present

## 2015-06-07 DIAGNOSIS — M47817 Spondylosis without myelopathy or radiculopathy, lumbosacral region: Secondary | ICD-10-CM | POA: Diagnosis not present

## 2015-06-07 DIAGNOSIS — M545 Low back pain: Secondary | ICD-10-CM | POA: Diagnosis not present

## 2015-06-10 ENCOUNTER — Other Ambulatory Visit: Payer: Self-pay | Admitting: Orthopaedic Surgery

## 2015-06-10 DIAGNOSIS — M545 Low back pain: Secondary | ICD-10-CM

## 2015-06-10 DIAGNOSIS — H2513 Age-related nuclear cataract, bilateral: Secondary | ICD-10-CM | POA: Diagnosis not present

## 2015-06-12 DIAGNOSIS — M545 Low back pain: Secondary | ICD-10-CM | POA: Diagnosis not present

## 2015-06-12 DIAGNOSIS — M542 Cervicalgia: Secondary | ICD-10-CM | POA: Diagnosis not present

## 2015-06-14 ENCOUNTER — Ambulatory Visit
Admission: RE | Admit: 2015-06-14 | Discharge: 2015-06-14 | Disposition: A | Discharge status: Home / Self Care | Payer: Medicare Other | Source: Ambulatory Visit | Attending: Orthopaedic Surgery | Admitting: Orthopaedic Surgery

## 2015-06-14 DIAGNOSIS — M5126 Other intervertebral disc displacement, lumbar region: Secondary | ICD-10-CM | POA: Diagnosis not present

## 2015-06-14 DIAGNOSIS — M545 Low back pain: Secondary | ICD-10-CM | POA: Diagnosis not present

## 2015-06-14 DIAGNOSIS — M5136 Other intervertebral disc degeneration, lumbar region: Secondary | ICD-10-CM | POA: Diagnosis not present

## 2015-06-14 DIAGNOSIS — M542 Cervicalgia: Secondary | ICD-10-CM | POA: Diagnosis not present

## 2015-06-14 DIAGNOSIS — M47817 Spondylosis without myelopathy or radiculopathy, lumbosacral region: Secondary | ICD-10-CM | POA: Diagnosis not present

## 2015-06-14 DIAGNOSIS — M4186 Other forms of scoliosis, lumbar region: Secondary | ICD-10-CM | POA: Diagnosis not present

## 2015-06-17 DIAGNOSIS — M545 Low back pain: Secondary | ICD-10-CM | POA: Diagnosis not present

## 2015-06-17 DIAGNOSIS — M542 Cervicalgia: Secondary | ICD-10-CM | POA: Diagnosis not present

## 2015-06-24 DIAGNOSIS — M545 Low back pain: Secondary | ICD-10-CM | POA: Diagnosis not present

## 2015-06-25 DIAGNOSIS — M545 Low back pain: Secondary | ICD-10-CM | POA: Diagnosis not present

## 2015-06-25 DIAGNOSIS — M542 Cervicalgia: Secondary | ICD-10-CM | POA: Diagnosis not present

## 2015-06-27 DIAGNOSIS — M545 Low back pain: Secondary | ICD-10-CM | POA: Diagnosis not present

## 2015-06-27 DIAGNOSIS — M542 Cervicalgia: Secondary | ICD-10-CM | POA: Diagnosis not present

## 2015-07-01 DIAGNOSIS — M19042 Primary osteoarthritis, left hand: Secondary | ICD-10-CM | POA: Diagnosis not present

## 2015-07-01 DIAGNOSIS — M6281 Muscle weakness (generalized): Secondary | ICD-10-CM | POA: Diagnosis not present

## 2015-07-01 DIAGNOSIS — M542 Cervicalgia: Secondary | ICD-10-CM | POA: Diagnosis not present

## 2015-07-01 DIAGNOSIS — M545 Low back pain: Secondary | ICD-10-CM | POA: Diagnosis not present

## 2015-07-01 DIAGNOSIS — M19041 Primary osteoarthritis, right hand: Secondary | ICD-10-CM | POA: Diagnosis not present

## 2015-07-01 DIAGNOSIS — R278 Other lack of coordination: Secondary | ICD-10-CM | POA: Diagnosis not present

## 2015-07-03 DIAGNOSIS — M6281 Muscle weakness (generalized): Secondary | ICD-10-CM | POA: Diagnosis not present

## 2015-07-03 DIAGNOSIS — M542 Cervicalgia: Secondary | ICD-10-CM | POA: Diagnosis not present

## 2015-07-03 DIAGNOSIS — M19041 Primary osteoarthritis, right hand: Secondary | ICD-10-CM | POA: Diagnosis not present

## 2015-07-03 DIAGNOSIS — M545 Low back pain: Secondary | ICD-10-CM | POA: Diagnosis not present

## 2015-07-03 DIAGNOSIS — R278 Other lack of coordination: Secondary | ICD-10-CM | POA: Diagnosis not present

## 2015-07-03 DIAGNOSIS — M19042 Primary osteoarthritis, left hand: Secondary | ICD-10-CM | POA: Diagnosis not present

## 2015-07-09 DIAGNOSIS — M5416 Radiculopathy, lumbar region: Secondary | ICD-10-CM | POA: Diagnosis not present

## 2015-07-10 DIAGNOSIS — M6281 Muscle weakness (generalized): Secondary | ICD-10-CM | POA: Diagnosis not present

## 2015-07-10 DIAGNOSIS — M542 Cervicalgia: Secondary | ICD-10-CM | POA: Diagnosis not present

## 2015-07-10 DIAGNOSIS — M545 Low back pain: Secondary | ICD-10-CM | POA: Diagnosis not present

## 2015-07-10 DIAGNOSIS — M19041 Primary osteoarthritis, right hand: Secondary | ICD-10-CM | POA: Diagnosis not present

## 2015-07-10 DIAGNOSIS — R278 Other lack of coordination: Secondary | ICD-10-CM | POA: Diagnosis not present

## 2015-07-10 DIAGNOSIS — M19042 Primary osteoarthritis, left hand: Secondary | ICD-10-CM | POA: Diagnosis not present

## 2015-07-11 DIAGNOSIS — M545 Low back pain: Secondary | ICD-10-CM | POA: Diagnosis not present

## 2015-07-11 DIAGNOSIS — M19041 Primary osteoarthritis, right hand: Secondary | ICD-10-CM | POA: Diagnosis not present

## 2015-07-11 DIAGNOSIS — M6281 Muscle weakness (generalized): Secondary | ICD-10-CM | POA: Diagnosis not present

## 2015-07-11 DIAGNOSIS — R278 Other lack of coordination: Secondary | ICD-10-CM | POA: Diagnosis not present

## 2015-07-11 DIAGNOSIS — M542 Cervicalgia: Secondary | ICD-10-CM | POA: Diagnosis not present

## 2015-07-11 DIAGNOSIS — M19042 Primary osteoarthritis, left hand: Secondary | ICD-10-CM | POA: Diagnosis not present

## 2015-07-12 DIAGNOSIS — M6281 Muscle weakness (generalized): Secondary | ICD-10-CM | POA: Diagnosis not present

## 2015-07-12 DIAGNOSIS — M19042 Primary osteoarthritis, left hand: Secondary | ICD-10-CM | POA: Diagnosis not present

## 2015-07-12 DIAGNOSIS — M545 Low back pain: Secondary | ICD-10-CM | POA: Diagnosis not present

## 2015-07-12 DIAGNOSIS — M542 Cervicalgia: Secondary | ICD-10-CM | POA: Diagnosis not present

## 2015-07-12 DIAGNOSIS — R278 Other lack of coordination: Secondary | ICD-10-CM | POA: Diagnosis not present

## 2015-07-12 DIAGNOSIS — M19041 Primary osteoarthritis, right hand: Secondary | ICD-10-CM | POA: Diagnosis not present

## 2015-07-15 DIAGNOSIS — M5416 Radiculopathy, lumbar region: Secondary | ICD-10-CM | POA: Diagnosis not present

## 2015-07-16 DIAGNOSIS — M545 Low back pain: Secondary | ICD-10-CM | POA: Diagnosis not present

## 2015-07-16 DIAGNOSIS — M6281 Muscle weakness (generalized): Secondary | ICD-10-CM | POA: Diagnosis not present

## 2015-07-16 DIAGNOSIS — M542 Cervicalgia: Secondary | ICD-10-CM | POA: Diagnosis not present

## 2015-07-16 DIAGNOSIS — M19042 Primary osteoarthritis, left hand: Secondary | ICD-10-CM | POA: Diagnosis not present

## 2015-07-16 DIAGNOSIS — R278 Other lack of coordination: Secondary | ICD-10-CM | POA: Diagnosis not present

## 2015-07-16 DIAGNOSIS — M19041 Primary osteoarthritis, right hand: Secondary | ICD-10-CM | POA: Diagnosis not present

## 2015-07-17 DIAGNOSIS — M6281 Muscle weakness (generalized): Secondary | ICD-10-CM | POA: Diagnosis not present

## 2015-07-17 DIAGNOSIS — M545 Low back pain: Secondary | ICD-10-CM | POA: Diagnosis not present

## 2015-07-17 DIAGNOSIS — M19041 Primary osteoarthritis, right hand: Secondary | ICD-10-CM | POA: Diagnosis not present

## 2015-07-17 DIAGNOSIS — M542 Cervicalgia: Secondary | ICD-10-CM | POA: Diagnosis not present

## 2015-07-17 DIAGNOSIS — M19042 Primary osteoarthritis, left hand: Secondary | ICD-10-CM | POA: Diagnosis not present

## 2015-07-17 DIAGNOSIS — R278 Other lack of coordination: Secondary | ICD-10-CM | POA: Diagnosis not present

## 2015-07-18 DIAGNOSIS — M19041 Primary osteoarthritis, right hand: Secondary | ICD-10-CM | POA: Diagnosis not present

## 2015-07-18 DIAGNOSIS — M19042 Primary osteoarthritis, left hand: Secondary | ICD-10-CM | POA: Diagnosis not present

## 2015-07-18 DIAGNOSIS — M6281 Muscle weakness (generalized): Secondary | ICD-10-CM | POA: Diagnosis not present

## 2015-07-18 DIAGNOSIS — R278 Other lack of coordination: Secondary | ICD-10-CM | POA: Diagnosis not present

## 2015-07-18 DIAGNOSIS — M542 Cervicalgia: Secondary | ICD-10-CM | POA: Diagnosis not present

## 2015-07-18 DIAGNOSIS — M545 Low back pain: Secondary | ICD-10-CM | POA: Diagnosis not present

## 2015-07-22 DIAGNOSIS — M545 Low back pain: Secondary | ICD-10-CM | POA: Diagnosis not present

## 2015-07-22 DIAGNOSIS — M19042 Primary osteoarthritis, left hand: Secondary | ICD-10-CM | POA: Diagnosis not present

## 2015-07-22 DIAGNOSIS — M6281 Muscle weakness (generalized): Secondary | ICD-10-CM | POA: Diagnosis not present

## 2015-07-22 DIAGNOSIS — M19041 Primary osteoarthritis, right hand: Secondary | ICD-10-CM | POA: Diagnosis not present

## 2015-07-22 DIAGNOSIS — M542 Cervicalgia: Secondary | ICD-10-CM | POA: Diagnosis not present

## 2015-07-22 DIAGNOSIS — R278 Other lack of coordination: Secondary | ICD-10-CM | POA: Diagnosis not present

## 2015-07-24 DIAGNOSIS — M19041 Primary osteoarthritis, right hand: Secondary | ICD-10-CM | POA: Diagnosis not present

## 2015-07-24 DIAGNOSIS — M542 Cervicalgia: Secondary | ICD-10-CM | POA: Diagnosis not present

## 2015-07-24 DIAGNOSIS — M545 Low back pain: Secondary | ICD-10-CM | POA: Diagnosis not present

## 2015-07-24 DIAGNOSIS — R278 Other lack of coordination: Secondary | ICD-10-CM | POA: Diagnosis not present

## 2015-07-24 DIAGNOSIS — M19042 Primary osteoarthritis, left hand: Secondary | ICD-10-CM | POA: Diagnosis not present

## 2015-07-24 DIAGNOSIS — M6281 Muscle weakness (generalized): Secondary | ICD-10-CM | POA: Diagnosis not present

## 2015-07-26 DIAGNOSIS — M5416 Radiculopathy, lumbar region: Secondary | ICD-10-CM | POA: Diagnosis not present

## 2015-07-30 DIAGNOSIS — M19041 Primary osteoarthritis, right hand: Secondary | ICD-10-CM | POA: Diagnosis not present

## 2015-07-30 DIAGNOSIS — M542 Cervicalgia: Secondary | ICD-10-CM | POA: Diagnosis not present

## 2015-07-30 DIAGNOSIS — M19042 Primary osteoarthritis, left hand: Secondary | ICD-10-CM | POA: Diagnosis not present

## 2015-07-30 DIAGNOSIS — M6281 Muscle weakness (generalized): Secondary | ICD-10-CM | POA: Diagnosis not present

## 2015-07-30 DIAGNOSIS — M545 Low back pain: Secondary | ICD-10-CM | POA: Diagnosis not present

## 2015-07-30 DIAGNOSIS — R278 Other lack of coordination: Secondary | ICD-10-CM | POA: Diagnosis not present

## 2015-08-01 DIAGNOSIS — M6281 Muscle weakness (generalized): Secondary | ICD-10-CM | POA: Diagnosis not present

## 2015-08-01 DIAGNOSIS — R278 Other lack of coordination: Secondary | ICD-10-CM | POA: Diagnosis not present

## 2015-08-01 DIAGNOSIS — M19041 Primary osteoarthritis, right hand: Secondary | ICD-10-CM | POA: Diagnosis not present

## 2015-08-01 DIAGNOSIS — M19042 Primary osteoarthritis, left hand: Secondary | ICD-10-CM | POA: Diagnosis not present

## 2015-08-06 DIAGNOSIS — N183 Chronic kidney disease, stage 3 (moderate): Secondary | ICD-10-CM | POA: Diagnosis not present

## 2015-08-06 DIAGNOSIS — I10 Essential (primary) hypertension: Secondary | ICD-10-CM | POA: Diagnosis not present

## 2015-08-06 DIAGNOSIS — Z23 Encounter for immunization: Secondary | ICD-10-CM | POA: Diagnosis not present

## 2015-08-06 DIAGNOSIS — M545 Low back pain: Secondary | ICD-10-CM | POA: Diagnosis not present

## 2015-08-07 DIAGNOSIS — M6281 Muscle weakness (generalized): Secondary | ICD-10-CM | POA: Diagnosis not present

## 2015-08-07 DIAGNOSIS — M19041 Primary osteoarthritis, right hand: Secondary | ICD-10-CM | POA: Diagnosis not present

## 2015-08-07 DIAGNOSIS — M19042 Primary osteoarthritis, left hand: Secondary | ICD-10-CM | POA: Diagnosis not present

## 2015-08-07 DIAGNOSIS — R278 Other lack of coordination: Secondary | ICD-10-CM | POA: Diagnosis not present

## 2015-08-13 DIAGNOSIS — M19041 Primary osteoarthritis, right hand: Secondary | ICD-10-CM | POA: Diagnosis not present

## 2015-08-13 DIAGNOSIS — R278 Other lack of coordination: Secondary | ICD-10-CM | POA: Diagnosis not present

## 2015-08-13 DIAGNOSIS — M6281 Muscle weakness (generalized): Secondary | ICD-10-CM | POA: Diagnosis not present

## 2015-08-13 DIAGNOSIS — M19042 Primary osteoarthritis, left hand: Secondary | ICD-10-CM | POA: Diagnosis not present

## 2015-08-15 DIAGNOSIS — M19042 Primary osteoarthritis, left hand: Secondary | ICD-10-CM | POA: Diagnosis not present

## 2015-08-15 DIAGNOSIS — M6281 Muscle weakness (generalized): Secondary | ICD-10-CM | POA: Diagnosis not present

## 2015-08-15 DIAGNOSIS — R278 Other lack of coordination: Secondary | ICD-10-CM | POA: Diagnosis not present

## 2015-08-15 DIAGNOSIS — M19041 Primary osteoarthritis, right hand: Secondary | ICD-10-CM | POA: Diagnosis not present

## 2015-08-20 DIAGNOSIS — M19041 Primary osteoarthritis, right hand: Secondary | ICD-10-CM | POA: Diagnosis not present

## 2015-08-20 DIAGNOSIS — M6281 Muscle weakness (generalized): Secondary | ICD-10-CM | POA: Diagnosis not present

## 2015-08-20 DIAGNOSIS — R278 Other lack of coordination: Secondary | ICD-10-CM | POA: Diagnosis not present

## 2015-08-20 DIAGNOSIS — M19042 Primary osteoarthritis, left hand: Secondary | ICD-10-CM | POA: Diagnosis not present

## 2015-08-22 DIAGNOSIS — M6281 Muscle weakness (generalized): Secondary | ICD-10-CM | POA: Diagnosis not present

## 2015-08-22 DIAGNOSIS — R278 Other lack of coordination: Secondary | ICD-10-CM | POA: Diagnosis not present

## 2015-08-22 DIAGNOSIS — M19042 Primary osteoarthritis, left hand: Secondary | ICD-10-CM | POA: Diagnosis not present

## 2015-08-22 DIAGNOSIS — M19041 Primary osteoarthritis, right hand: Secondary | ICD-10-CM | POA: Diagnosis not present

## 2015-08-27 DIAGNOSIS — M19041 Primary osteoarthritis, right hand: Secondary | ICD-10-CM | POA: Diagnosis not present

## 2015-08-27 DIAGNOSIS — R278 Other lack of coordination: Secondary | ICD-10-CM | POA: Diagnosis not present

## 2015-08-27 DIAGNOSIS — M19042 Primary osteoarthritis, left hand: Secondary | ICD-10-CM | POA: Diagnosis not present

## 2015-08-27 DIAGNOSIS — M6281 Muscle weakness (generalized): Secondary | ICD-10-CM | POA: Diagnosis not present

## 2015-09-14 ENCOUNTER — Emergency Department (HOSPITAL_COMMUNITY)
Admission: EM | Admit: 2015-09-14 | Discharge: 2015-09-14 | Disposition: A | Discharge status: Home / Self Care | Payer: Medicare Other | Attending: Emergency Medicine | Admitting: Emergency Medicine

## 2015-09-14 ENCOUNTER — Encounter (HOSPITAL_COMMUNITY): Payer: Self-pay | Admitting: Emergency Medicine

## 2015-09-14 ENCOUNTER — Emergency Department (HOSPITAL_COMMUNITY): Payer: Medicare Other

## 2015-09-14 DIAGNOSIS — R1084 Generalized abdominal pain: Secondary | ICD-10-CM | POA: Diagnosis not present

## 2015-09-14 DIAGNOSIS — F419 Anxiety disorder, unspecified: Secondary | ICD-10-CM | POA: Diagnosis not present

## 2015-09-14 DIAGNOSIS — Z8669 Personal history of other diseases of the nervous system and sense organs: Secondary | ICD-10-CM | POA: Insufficient documentation

## 2015-09-14 DIAGNOSIS — R011 Cardiac murmur, unspecified: Secondary | ICD-10-CM | POA: Diagnosis not present

## 2015-09-14 DIAGNOSIS — I129 Hypertensive chronic kidney disease with stage 1 through stage 4 chronic kidney disease, or unspecified chronic kidney disease: Secondary | ICD-10-CM | POA: Diagnosis not present

## 2015-09-14 DIAGNOSIS — K219 Gastro-esophageal reflux disease without esophagitis: Secondary | ICD-10-CM | POA: Diagnosis not present

## 2015-09-14 DIAGNOSIS — N183 Chronic kidney disease, stage 3 (moderate): Secondary | ICD-10-CM | POA: Diagnosis not present

## 2015-09-14 DIAGNOSIS — E785 Hyperlipidemia, unspecified: Secondary | ICD-10-CM | POA: Diagnosis not present

## 2015-09-14 DIAGNOSIS — M1990 Unspecified osteoarthritis, unspecified site: Secondary | ICD-10-CM | POA: Diagnosis not present

## 2015-09-14 DIAGNOSIS — Z880 Allergy status to penicillin: Secondary | ICD-10-CM | POA: Diagnosis not present

## 2015-09-14 DIAGNOSIS — I2510 Atherosclerotic heart disease of native coronary artery without angina pectoris: Secondary | ICD-10-CM | POA: Diagnosis not present

## 2015-09-14 DIAGNOSIS — Z85828 Personal history of other malignant neoplasm of skin: Secondary | ICD-10-CM | POA: Diagnosis not present

## 2015-09-14 DIAGNOSIS — Z8781 Personal history of (healed) traumatic fracture: Secondary | ICD-10-CM | POA: Diagnosis not present

## 2015-09-14 DIAGNOSIS — N200 Calculus of kidney: Secondary | ICD-10-CM | POA: Diagnosis not present

## 2015-09-14 DIAGNOSIS — N400 Enlarged prostate without lower urinary tract symptoms: Secondary | ICD-10-CM | POA: Diagnosis not present

## 2015-09-14 DIAGNOSIS — R109 Unspecified abdominal pain: Secondary | ICD-10-CM

## 2015-09-14 DIAGNOSIS — K5900 Constipation, unspecified: Secondary | ICD-10-CM | POA: Diagnosis not present

## 2015-09-14 DIAGNOSIS — Z79899 Other long term (current) drug therapy: Secondary | ICD-10-CM | POA: Diagnosis not present

## 2015-09-14 DIAGNOSIS — F329 Major depressive disorder, single episode, unspecified: Secondary | ICD-10-CM | POA: Insufficient documentation

## 2015-09-14 LAB — URINALYSIS, ROUTINE W REFLEX MICROSCOPIC
Bilirubin Urine: NEGATIVE
Glucose, UA: NEGATIVE mg/dL
Hgb urine dipstick: NEGATIVE
Ketones, ur: NEGATIVE mg/dL
Leukocytes, UA: NEGATIVE
Nitrite: NEGATIVE
Protein, ur: NEGATIVE mg/dL
Specific Gravity, Urine: 1.012 (ref 1.005–1.030)
Urobilinogen, UA: 0.2 mg/dL (ref 0.0–1.0)
pH: 6.5 (ref 5.0–8.0)

## 2015-09-14 LAB — CBC WITH DIFFERENTIAL/PLATELET
Basophils Absolute: 0 10*3/uL (ref 0.0–0.1)
Basophils Relative: 0 %
Eosinophils Absolute: 0.1 10*3/uL (ref 0.0–0.7)
Eosinophils Relative: 1 %
HCT: 38.8 % — ABNORMAL LOW (ref 39.0–52.0)
Hemoglobin: 12.6 g/dL — ABNORMAL LOW (ref 13.0–17.0)
Lymphocytes Relative: 11 %
Lymphs Abs: 1 10*3/uL (ref 0.7–4.0)
MCH: 31.6 pg (ref 26.0–34.0)
MCHC: 32.5 g/dL (ref 30.0–36.0)
MCV: 97.2 fL (ref 78.0–100.0)
Monocytes Absolute: 1 10*3/uL (ref 0.1–1.0)
Monocytes Relative: 10 %
Neutro Abs: 7.2 10*3/uL (ref 1.7–7.7)
Neutrophils Relative %: 78 %
Platelets: 171 10*3/uL (ref 150–400)
RBC: 3.99 MIL/uL — ABNORMAL LOW (ref 4.22–5.81)
RDW: 13.2 % (ref 11.5–15.5)
WBC: 9.3 10*3/uL (ref 4.0–10.5)

## 2015-09-14 LAB — COMPREHENSIVE METABOLIC PANEL
ALT: 20 U/L (ref 17–63)
AST: 27 U/L (ref 15–41)
Albumin: 4 g/dL (ref 3.5–5.0)
Alkaline Phosphatase: 90 U/L (ref 38–126)
Anion gap: 10 (ref 5–15)
BUN: 23 mg/dL — ABNORMAL HIGH (ref 6–20)
CO2: 25 mmol/L (ref 22–32)
Calcium: 9.1 mg/dL (ref 8.9–10.3)
Chloride: 101 mmol/L (ref 101–111)
Creatinine, Ser: 1.64 mg/dL — ABNORMAL HIGH (ref 0.61–1.24)
GFR calc Af Amer: 41 mL/min — ABNORMAL LOW (ref >60)
GFR calc non Af Amer: 35 mL/min — ABNORMAL LOW (ref >60)
Glucose, Bld: 94 mg/dL (ref 65–99)
Potassium: 4.1 mmol/L (ref 3.5–5.1)
Sodium: 136 mmol/L (ref 135–145)
Total Bilirubin: 0.6 mg/dL (ref 0.3–1.2)
Total Protein: 6.7 g/dL (ref 6.5–8.1)

## 2015-09-14 LAB — PROTIME-INR
INR: 1.08 (ref 0.00–1.49)
Prothrombin Time: 14.2 seconds (ref 11.6–15.2)

## 2015-09-14 LAB — LIPASE, BLOOD: Lipase: 24 U/L (ref 22–51)

## 2015-09-14 MED ORDER — MORPHINE SULFATE (PF) 2 MG/ML IV SOLN
2.0000 mg | Freq: Once | INTRAVENOUS | Status: AC
Start: 2015-09-14 — End: 2015-09-14
  Administered 2015-09-14: 2 mg via INTRAVENOUS
  Filled 2015-09-14: qty 1

## 2015-09-14 MED ORDER — ACETAMINOPHEN 500 MG PO TABS: 1000.0000 mg | ORAL_TABLET | Freq: Four times a day (QID) | ORAL | Status: DC | PRN

## 2015-09-14 MED ORDER — POLYETHYLENE GLYCOL 3350 17 G PO PACK: 17.0000 g | PACK | Freq: Every day | ORAL | Status: DC

## 2015-09-14 MED ORDER — ACETAMINOPHEN 325 MG PO TABS
650.0000 mg | ORAL_TABLET | Freq: Once | ORAL | Status: AC
  Administered 2015-09-14: 650 mg via ORAL
  Filled 2015-09-14: qty 2

## 2015-09-14 MED ORDER — DOCUSATE SODIUM 100 MG PO CAPS: 100.0000 mg | ORAL_CAPSULE | Freq: Two times a day (BID) | ORAL | Status: DC

## 2015-09-14 NOTE — ED Notes (Signed)
Pt back from ct

## 2015-09-14 NOTE — ED Provider Notes (Signed)
CSN: [id number]     Arrival date & time 09/14/15  1410 History   First MD Initiated Contact with Patient 09/14/15 1455     Chief Complaint  Patient presents with  . Abdominal Pain  . Back Pain     (Consider location/radiation/quality/duration/timing/severity/associated sxs/prior Treatment) HPI Patient reports severe, spasmodic abdominal pain starting yesterday evening. He reports that goes in through his central abdomen and radiates around to his back. It is particularly made worse by movement such as sitting forward and twisting. He reports he has had normal urination. No pain or burning. He has not had fever. He reports some nausea but no vomiting. He reports typically normal bowel movement however for the past 2 days has not had a bowel movement in the a.m. He reports he has had a intra-abdominal hernia repair done laparoscopically in the past. He reports he used to have severe pain before the procedure but was well afterwards. Past Medical History  Diagnosis Date  . Hypertension   . Hyperlipemia   . Inguinal hernia without mention of obstruction or gangrene, unilateral or unspecified, (not specified as recurrent)   . CKD (chronic kidney disease), stage III   . Anxiety   . Osteopenia 2002  . BPH (benign prostatic hyperplasia)   . Osteoarthritis   . Gout   . CAD (coronary artery disease)   . GERD (gastroesophageal reflux disease)   . Vision disturbance   . Lumbar radiculopathy   . Vertigo 1999    negative ct scan head; rare recurrance, last in 2013  . Bleeding from the nose 2009    cautery by Dr. Erik Obey  . Cataract     right eye  . Impaired fasting glucose   . Skin cancer of forehead     left (BCE)  . Distal radius fracture 2013    Dr. Fredna Dow  . Major depressive disorder, single episode, unspecified ([place])   . Nosebleed     most recent 05/24/13  to have  cauterized prior to surgery   . Dysrhythmia     hx of atrial fib- 10 years ago   . H/O hiatal hernia   . Anginal pain  [facility])    Past Surgical History  Procedure Laterality Date  . Tonsillectomy      at age 89 yrs old  . Eye surgery      right eye cataract surgery   . Inguinal hernia repair Right 06/07/2013    Procedure: LAPAROSCOPIC INGUINAL HERNIA REPAIR;  Surgeon: Adin Hector, MD;  Location: [facility];  Service: General;  Laterality: Right;  With Mesh  . Insertion of mesh Right 06/07/2013    Procedure: INSERTION OF MESH;  Surgeon: Adin Hector, MD;  Location: [facility];  Service: General;  Laterality: Right;  . Esophagogastroduodenoscopy N/A 07/10/2014    Procedure: ESOPHAGOGASTRODUODENOSCOPY (EGD);  Surgeon: Garlan Fair, MD;  Location: [facility];  Service: Endoscopy;  Laterality: N/A;  . Hiatal hernia repair N/A 10/03/2014    Procedure: LAPAROSCOPIC REPAIR GIANT HIATAL HERNIA, POSSIBLE OPEN;  Surgeon: Fanny Skates, MD;  Location: [facility];  Service: General;  Laterality: N/A;  . Gastrostomy N/A 10/03/2014    Procedure: GASTROSTOMY TUBE;  Surgeon: Fanny Skates, MD;  Location: [facility];  Service: General;  Laterality: N/A;   Family History  Problem Relation Age of Onset  . Heart disease Mother   . Heart disease Father   . Cancer Sister     breast    Social History  Substance Use Topics  .  Smoking status: Never Smoker   . Smokeless tobacco: Never Used  . Alcohol Use: Yes     Comment: occasional    Review of Systems  10 Systems reviewed and are negative for acute change except as noted in the HPI.   Allergies  Penicillin g and Penicillins  Home Medications   Prior to Admission medications   Medication Sig Start Date End Date Taking? Authorizing Provider  ALPRAZolam (XANAX) 0.25 MG tablet Take 0.125 mg by mouth at bedtime as needed for sleep.    Yes Historical Provider, MD  amLODipine (NORVASC) 2.5 MG tablet Take 2.5 mg by mouth daily as needed (blood pressure).    Yes Historical Provider, MD  ENSURE PLUS (ENSURE PLUS) LIQD Take 237 mLs by mouth daily.   Yes Historical Provider, MD   lisinopril (PRINIVIL,ZESTRIL) 20 MG tablet Take 10 mg by mouth 2 (two) times daily.    Yes Historical Provider, MD  metoprolol tartrate (LOPRESSOR) 25 MG tablet Take 12.5 mg by mouth 2 (two) times daily.   Yes Historical Provider, MD  omeprazole (PRILOSEC) 20 MG capsule Take 40 mg by mouth every morning.   Yes Historical Provider, MD  pravastatin (PRAVACHOL) 20 MG tablet Take 20 mg by mouth at bedtime.   Yes Historical Provider, MD  tamsulosin (FLOMAX) 0.4 MG CAPS Take 0.4 mg by mouth at bedtime.    Yes Historical Provider, MD  acetaminophen (TYLENOL) 500 MG tablet Take 2 tablets (1,000 mg total) by mouth every 6 (six) hours as needed. 09/14/15   Charlesetta Shanks, MD  docusate sodium (COLACE) 100 MG capsule Take 1 capsule (100 mg total) by mouth every 12 (twelve) hours. 09/14/15   Charlesetta Shanks, MD  polyethylene glycol (MIRALAX / GLYCOLAX) packet Take 17 g by mouth daily. 09/14/15   Charlesetta Shanks, MD   BP 179/79 mmHg  Pulse 63  Temp(Src) 97.9 F (36.6 C) (Oral)  Resp 16  Ht 5\' 7"  (1.702 m)  Wt 140 lb (63.504 kg)  BMI 21.92 kg/m2  SpO2 98% Physical Exam  Constitutional: He is oriented to person, place, and time. He appears well-developed and well-nourished.  HENT:  Head: Normocephalic and atraumatic.  Eyes: EOM are normal. Pupils are equal, round, and reactive to light.  Neck: Neck supple.  Cardiovascular: Normal rate, regular rhythm and intact distal pulses.   3/6 systolic ejection murmur.  Pulmonary/Chest: Effort normal and breath sounds normal.  Abdominal: Soft. Bowel sounds are normal. He exhibits no distension. There is tenderness.  Patient expresses severe pain with twisting or forward bending. Palpation does not reveal localizing mass or palpable hernia at this time. He does have well-healed old trocar scars. No mass in the groin. Patient's pain is less reproducible by palpation than by patient movements.  Musculoskeletal: Normal range of motion. He exhibits no edema.   Neurological: He is alert and oriented to person, place, and time. He has normal strength. Coordination normal. GCS eye subscore is 4. GCS verbal subscore is 5. GCS motor subscore is 6.  Skin: Skin is warm, dry and intact.  Psychiatric: He has a normal mood and affect.    ED Course  Procedures (including critical care time) Labs Review Labs Reviewed  CBC WITH DIFFERENTIAL/PLATELET - Abnormal; Notable for the following:    RBC 3.99 (*)    Hemoglobin 12.6 (*)    HCT 38.8 (*)    All other components within normal limits  COMPREHENSIVE METABOLIC PANEL - Abnormal; Notable for the following:    BUN 23 (*)  Creatinine, Ser 1.64 (*)    GFR calc non Af Amer 35 (*)    GFR calc Af Amer 41 (*)    All other components within normal limits  PROTIME-INR  LIPASE, BLOOD  URINALYSIS, ROUTINE W REFLEX MICROSCOPIC (NOT AT [facility])    Imaging Review Ct Abdomen Pelvis Wo Contrast  09/14/2015  CLINICAL DATA:  Abdominal pain. Pt c/o "spasms" across abdomen at level of umbilicus; pain is worse with Movement; no BM x 2 days Hx of repair of hiatel hernia, 2015 Hx of RIGHT inguinal hernia repair, 2014 EXAM: CT ABDOMEN AND PELVIS WITHOUT CONTRAST TECHNIQUE: Multidetector CT imaging of the abdomen and pelvis was performed following the standard protocol without IV contrast. COMPARISON:  Ultrasound of 05/08/2014.  No prior CT. FINDINGS: Lower chest: Bibasilar atelectasis or scarring. Normal heart size without pericardial or pleural effusion. Right coronary artery atherosclerosis. Surgical changes at the gastroesophageal junction. Hepatobiliary: Mild motion degradation. Old granulomatous disease in the liver. Normal gallbladder, without biliary ductal dilatation. Pancreas: Moderate pancreatic atrophy, without duct dilatation. Spleen: Normal in size, without focal abnormality. Adrenals/Urinary Tract: Normal adrenal glands. Renal cortical thinning bilaterally. Left renal collecting system calculus of 6 mm. A smaller  lower pole left renal collecting system calculus. No hydronephrosis. No hydroureter or ureteric calculi. No bladder calculi. Stomach/Bowel: Although the proximal stomach is underdistended, suspicion of concurrent wall thickening, including on image/series 14/2. Example at 2.1 cm. Scattered colonic diverticula. Colonic stool burden suggests constipation. Normal terminal ileum and appendix. Normal small bowel. Vascular/Lymphatic: Aortic and branch vessel atherosclerosis. No abdominopelvic adenopathy. Reproductive: Moderate prostatomegaly. Other: No significant free fluid. Fat containing left inguinal hernia. Right inguinal hernia repair. Tiny fat containing periumbilical ventral hernia. Musculoskeletal: Convex right lumbar spine curvature. Osteopenia. Advanced lumbosacral degenerative disc disease. IMPRESSION: 1.  Possible constipation. 2. Suspect mild gastric wall thickening, as can be seen with gastritis. 3. Left nephrolithiasis, without obstructive uropathy. Electronically Signed   By: Abigail Miyamoto M.D.   On: 09/14/2015 17:47   I have personally reviewed and evaluated these images and lab results as part of my medical decision-making.   EKG Interpretation None      MDM   Final diagnoses:  Constipation, unspecified constipation type   Patient presents well in appearance. He has been having cramping spasmodic pain. CT shows a large stool burden in the colon. At this time findings and history are most consistent with constipation. His abdominal examination is soft without reproducible pain. Pain has been episodic consistent with peristaltic discomfort. At this time patient will be treated with MiraLAX and Colace. Family members are present. The patient is otherwise a very well appearing 81 year old who has good mental function and we will initiate home treatment. Patient and family members are counseled on the need to return if he should develop persistent pain, fever, general illness or any other  concerning symptoms.    Charlesetta Shanks, MD 09/14/15 [phone]

## 2015-09-14 NOTE — ED Notes (Signed)
[facility]. Reports back pain and periumbilical stomach pain with movements like sitting down, standing up, leaning back or forwards.

## 2015-09-14 NOTE — Discharge Instructions (Signed)
Constipation, Adult °Constipation is when a person has fewer than three bowel movements a week, has difficulty having a bowel movement, or has stools that are dry, hard, or larger than normal. As people grow older, constipation is more common. A low-fiber diet, not taking in enough fluids, and taking certain medicines may make constipation worse.  °CAUSES  °· Certain medicines, such as antidepressants, pain medicine, iron supplements, antacids, and water pills.   °· Certain diseases, such as diabetes, irritable bowel syndrome (IBS), thyroid disease, or depression.   °· Not drinking enough water.   °· Not eating enough fiber-rich foods.   °· Stress or travel.   °· Lack of physical activity or exercise.   °· Ignoring the urge to have a bowel movement.   °· Using laxatives too much.   °SIGNS AND SYMPTOMS  °· Having fewer than three bowel movements a week.   °· Straining to have a bowel movement.   °· Having stools that are hard, dry, or larger than normal.   °· Feeling full or bloated.   °· Pain in the lower abdomen.   °· Not feeling relief after having a bowel movement.   °DIAGNOSIS  °Your health care provider will take a medical history and perform a physical exam. Further testing may be done for severe constipation. Some tests may include: °· A barium enema X-ray to examine your rectum, colon, and, sometimes, your small intestine.   °· A sigmoidoscopy to examine your lower colon.   °· A colonoscopy to examine your entire colon. °TREATMENT  °Treatment will depend on the severity of your constipation and what is causing it. Some dietary treatments include drinking more fluids and eating more fiber-rich foods. Lifestyle treatments may include regular exercise. If these diet and lifestyle recommendations do not help, your health care provider may recommend taking over-the-counter laxative medicines to help you have bowel movements. Prescription medicines may be prescribed if over-the-counter medicines do not work.    °HOME CARE INSTRUCTIONS  °· Eat foods that have a lot of fiber, such as fruits, vegetables, whole grains, and beans. °· Limit foods high in fat and processed sugars, such as french fries, hamburgers, cookies, candies, and soda.   °· A fiber supplement may be added to your diet if you cannot get enough fiber from foods.   °· Drink enough fluids to keep your urine clear or pale yellow.   °· Exercise regularly or as directed by your health care provider.   °· Go to the restroom when you have the urge to go. Do not hold it.   °· Only take over-the-counter or prescription medicines as directed by your health care provider. Do not take other medicines for constipation without talking to your health care provider first.   °SEEK IMMEDIATE MEDICAL CARE IF:  °· You have bright red blood in your stool.   °· Your constipation lasts for more than 4 days or gets worse.   °· You have abdominal or rectal pain.   °· You have thin, pencil-like stools.   °· You have unexplained weight loss. °MAKE SURE YOU:  °· Understand these instructions. °· Will watch your condition. °· Will get help right away if you are not doing well or get worse. °  °This information is not intended to replace advice given to you by your health care provider. Make sure you discuss any questions you have with your health care provider. °  °Document Released: 08/14/2004 Document Revised: 12/07/2014 Document Reviewed: 08/28/2013 °Elsevier Interactive Patient Education ©2016 [employer] Inc[employer] ° °Abdominal Pain, Adult °Many things can cause abdominal pain. Usually, abdominal pain is not caused by a disease and   will improve without treatment. It can often be observed and treated at home. Your health care provider will do a physical exam and possibly order blood tests and X-rays to help determine the seriousness of your pain. However, in many cases, more time must pass before a clear cause of the pain can be found. Before that point, your health care provider may not  know if you need more testing or further treatment. °HOME CARE INSTRUCTIONS °Monitor your abdominal pain for any changes. The following actions may help to alleviate any discomfort you are experiencing: °· Only take over-the-counter or prescription medicines as directed by your health care provider. °· Do not take laxatives unless directed to do so by your health care provider. °· Try a clear liquid diet (broth, tea, or water) as directed by your health care provider. Slowly move to a bland diet as tolerated. °SEEK MEDICAL CARE IF: °· You have unexplained abdominal pain. °· You have abdominal pain associated with nausea or diarrhea. °· You have pain when you urinate or have a bowel movement. °· You experience abdominal pain that wakes you in the night. °· You have abdominal pain that is worsened or improved by eating food. °· You have abdominal pain that is worsened with eating fatty foods. °· You have a fever. °SEEK IMMEDIATE MEDICAL CARE IF: °· Your pain does not go away within 2 hours. °· You keep throwing up (vomiting). °· Your pain is felt only in portions of the abdomen, such as the right side or the left lower portion of the abdomen. °· You pass bloody or black tarry stools. °MAKE SURE YOU: °· Understand these instructions. °· Will watch your condition. °· Will get help right away if you are not doing well or get worse. °  °This information is not intended to replace advice given to you by your health care provider. Make sure you discuss any questions you have with your health care provider. °  °Document Released: 08/26/2005 Document Revised: 08/07/2015 Document Reviewed: 07/26/2013 °Elsevier Interactive Patient Education ©2016 Elsevier Inc. ° °

## 2015-09-16 DIAGNOSIS — R109 Unspecified abdominal pain: Secondary | ICD-10-CM | POA: Diagnosis not present

## 2015-09-16 DIAGNOSIS — K5900 Constipation, unspecified: Secondary | ICD-10-CM | POA: Diagnosis not present

## 2015-09-16 DIAGNOSIS — M47817 Spondylosis without myelopathy or radiculopathy, lumbosacral region: Secondary | ICD-10-CM | POA: Diagnosis not present

## 2015-09-19 DIAGNOSIS — K5900 Constipation, unspecified: Secondary | ICD-10-CM | POA: Diagnosis not present

## 2015-09-19 DIAGNOSIS — I2510 Atherosclerotic heart disease of native coronary artery without angina pectoris: Secondary | ICD-10-CM | POA: Diagnosis not present

## 2015-09-19 DIAGNOSIS — I700 Atherosclerosis of aorta: Secondary | ICD-10-CM | POA: Diagnosis not present

## 2015-09-19 DIAGNOSIS — M545 Low back pain: Secondary | ICD-10-CM | POA: Diagnosis not present

## 2015-09-23 DIAGNOSIS — K5900 Constipation, unspecified: Secondary | ICD-10-CM | POA: Diagnosis not present

## 2015-09-23 DIAGNOSIS — R109 Unspecified abdominal pain: Secondary | ICD-10-CM | POA: Diagnosis not present

## 2015-09-30 DIAGNOSIS — M5416 Radiculopathy, lumbar region: Secondary | ICD-10-CM | POA: Diagnosis not present

## 2015-10-04 DIAGNOSIS — K5900 Constipation, unspecified: Secondary | ICD-10-CM | POA: Diagnosis not present

## 2015-10-21 DIAGNOSIS — M5416 Radiculopathy, lumbar region: Secondary | ICD-10-CM | POA: Diagnosis not present

## 2015-10-21 DIAGNOSIS — M47817 Spondylosis without myelopathy or radiculopathy, lumbosacral region: Secondary | ICD-10-CM | POA: Diagnosis not present

## 2015-10-29 ENCOUNTER — Other Ambulatory Visit (HOSPITAL_COMMUNITY): Payer: Self-pay | Admitting: Orthopaedic Surgery

## 2015-10-29 DIAGNOSIS — R194 Change in bowel habit: Secondary | ICD-10-CM | POA: Diagnosis not present

## 2015-10-29 DIAGNOSIS — K5900 Constipation, unspecified: Secondary | ICD-10-CM | POA: Diagnosis not present

## 2015-10-29 DIAGNOSIS — M79662 Pain in left lower leg: Secondary | ICD-10-CM | POA: Diagnosis not present

## 2015-10-29 DIAGNOSIS — R52 Pain, unspecified: Secondary | ICD-10-CM

## 2015-10-30 ENCOUNTER — Ambulatory Visit (HOSPITAL_COMMUNITY)
Admission: RE | Admit: 2015-10-30 | Discharge: 2015-10-30 | Disposition: A | Discharge status: Home / Self Care | Payer: Medicare Other | Source: Ambulatory Visit | Attending: Internal Medicine | Admitting: Internal Medicine

## 2015-10-30 DIAGNOSIS — R52 Pain, unspecified: Secondary | ICD-10-CM | POA: Diagnosis not present

## 2015-10-30 DIAGNOSIS — M79662 Pain in left lower leg: Secondary | ICD-10-CM | POA: Insufficient documentation

## 2015-10-30 NOTE — Progress Notes (Signed)
Preliminary results by tech - Left Lower Ext. Venous duplex completed. Negative for deep and superficial vein thrombosis in the left leg. Oda Cogan, BS, RDMS, RVT

## 2015-11-14 DIAGNOSIS — B353 Tinea pedis: Secondary | ICD-10-CM | POA: Diagnosis not present

## 2015-11-14 DIAGNOSIS — Z1389 Encounter for screening for other disorder: Secondary | ICD-10-CM | POA: Diagnosis not present

## 2015-11-14 DIAGNOSIS — Z0000 Encounter for general adult medical examination without abnormal findings: Secondary | ICD-10-CM | POA: Diagnosis not present

## 2015-11-14 DIAGNOSIS — N183 Chronic kidney disease, stage 3 (moderate): Secondary | ICD-10-CM | POA: Diagnosis not present

## 2015-11-14 DIAGNOSIS — R05 Cough: Secondary | ICD-10-CM | POA: Diagnosis not present

## 2015-11-14 DIAGNOSIS — I10 Essential (primary) hypertension: Secondary | ICD-10-CM | POA: Diagnosis not present

## 2015-11-14 DIAGNOSIS — E785 Hyperlipidemia, unspecified: Secondary | ICD-10-CM | POA: Diagnosis not present

## 2015-11-14 DIAGNOSIS — G4700 Insomnia, unspecified: Secondary | ICD-10-CM | POA: Diagnosis not present

## 2015-12-04 DIAGNOSIS — M5416 Radiculopathy, lumbar region: Secondary | ICD-10-CM | POA: Diagnosis not present

## 2015-12-17 DIAGNOSIS — R0982 Postnasal drip: Secondary | ICD-10-CM | POA: Diagnosis not present

## 2015-12-19 DIAGNOSIS — M4806 Spinal stenosis, lumbar region: Secondary | ICD-10-CM | POA: Diagnosis not present

## 2015-12-24 DIAGNOSIS — H0015 Chalazion left lower eyelid: Secondary | ICD-10-CM | POA: Diagnosis not present

## 2015-12-29 DIAGNOSIS — H00035 Abscess of left lower eyelid: Secondary | ICD-10-CM | POA: Diagnosis not present

## 2016-01-16 ENCOUNTER — Ambulatory Visit (INDEPENDENT_AMBULATORY_CARE_PROVIDER_SITE_OTHER): Payer: Medicare Other | Admitting: Podiatry

## 2016-01-16 ENCOUNTER — Encounter: Payer: Self-pay | Admitting: Podiatry

## 2016-01-16 VITALS — BP 128/69 | HR 69 | Resp 14

## 2016-01-16 DIAGNOSIS — B351 Tinea unguium: Secondary | ICD-10-CM

## 2016-01-16 DIAGNOSIS — M79609 Pain in unspecified limb: Principal | ICD-10-CM

## 2016-01-16 DIAGNOSIS — M79676 Pain in unspecified toe(s): Secondary | ICD-10-CM

## 2016-01-16 NOTE — Progress Notes (Signed)
   Subjective:    Patient ID: Larry Mcfarland, male    DOB: 1920-12-10, 82 y.o.   MRN: [id number]  HPI this patient presents to the office with chief complaint of long thick painful nails. He states his nails have grown thick and long, but especially the big toe nail on the left foot is painful. He states the nails are painful walking and wearing his shoes. He presents the office for preventive foot care services.      Review of Systems  All other systems reviewed and are negative.      Objective:   Physical Exam GENERAL APPEARANCE: Alert, conversant. Appropriately groomed. No acute distress.  VASCULAR: Pedal pulses palpable at  [facility] and PT bilateral.  Capillary refill time is immediate to all digits,  Normal temperature gradient.  Digital hair growth is present bilateral  NEUROLOGIC: sensation is normal to 5.07 monofilament at 5/5 sites bilateral.  Light touch is intact bilateral, Muscle strength normal.  MUSCULOSKELETAL: acceptable muscle strength, tone and stability bilateral.  Intrinsic muscluature intact bilateral.  Rectus appearance of foot and digits noted bilateral.   DERMATOLOGIC: skin color, texture, and turgor are within normal limits.  No preulcerative lesions or ulcers  are seen, no interdigital maceration noted.  No open lesions present.  . No drainage noted. NAILS  Thick disfigured discolored nails both feet.        Assessment & Plan:  Onychomycosis B/L  IE  Debride Nails  RTC 3 months.   Gardiner Barefoot DPM

## 2016-02-09 IMAGING — CR DG CHEST 2V
2 series · 2 of 2 positions shown · non-contrast
Comparison: Postoperative upper GI study 10/04/2014 ; prior acute
abdominal series [DATE] 04/18/2014

CLINICAL DATA: [AGE] male with shortness of breath. History
of recent hiatal hernia repair.

EXAM:
CHEST  2 VIEW

[w chest pa]
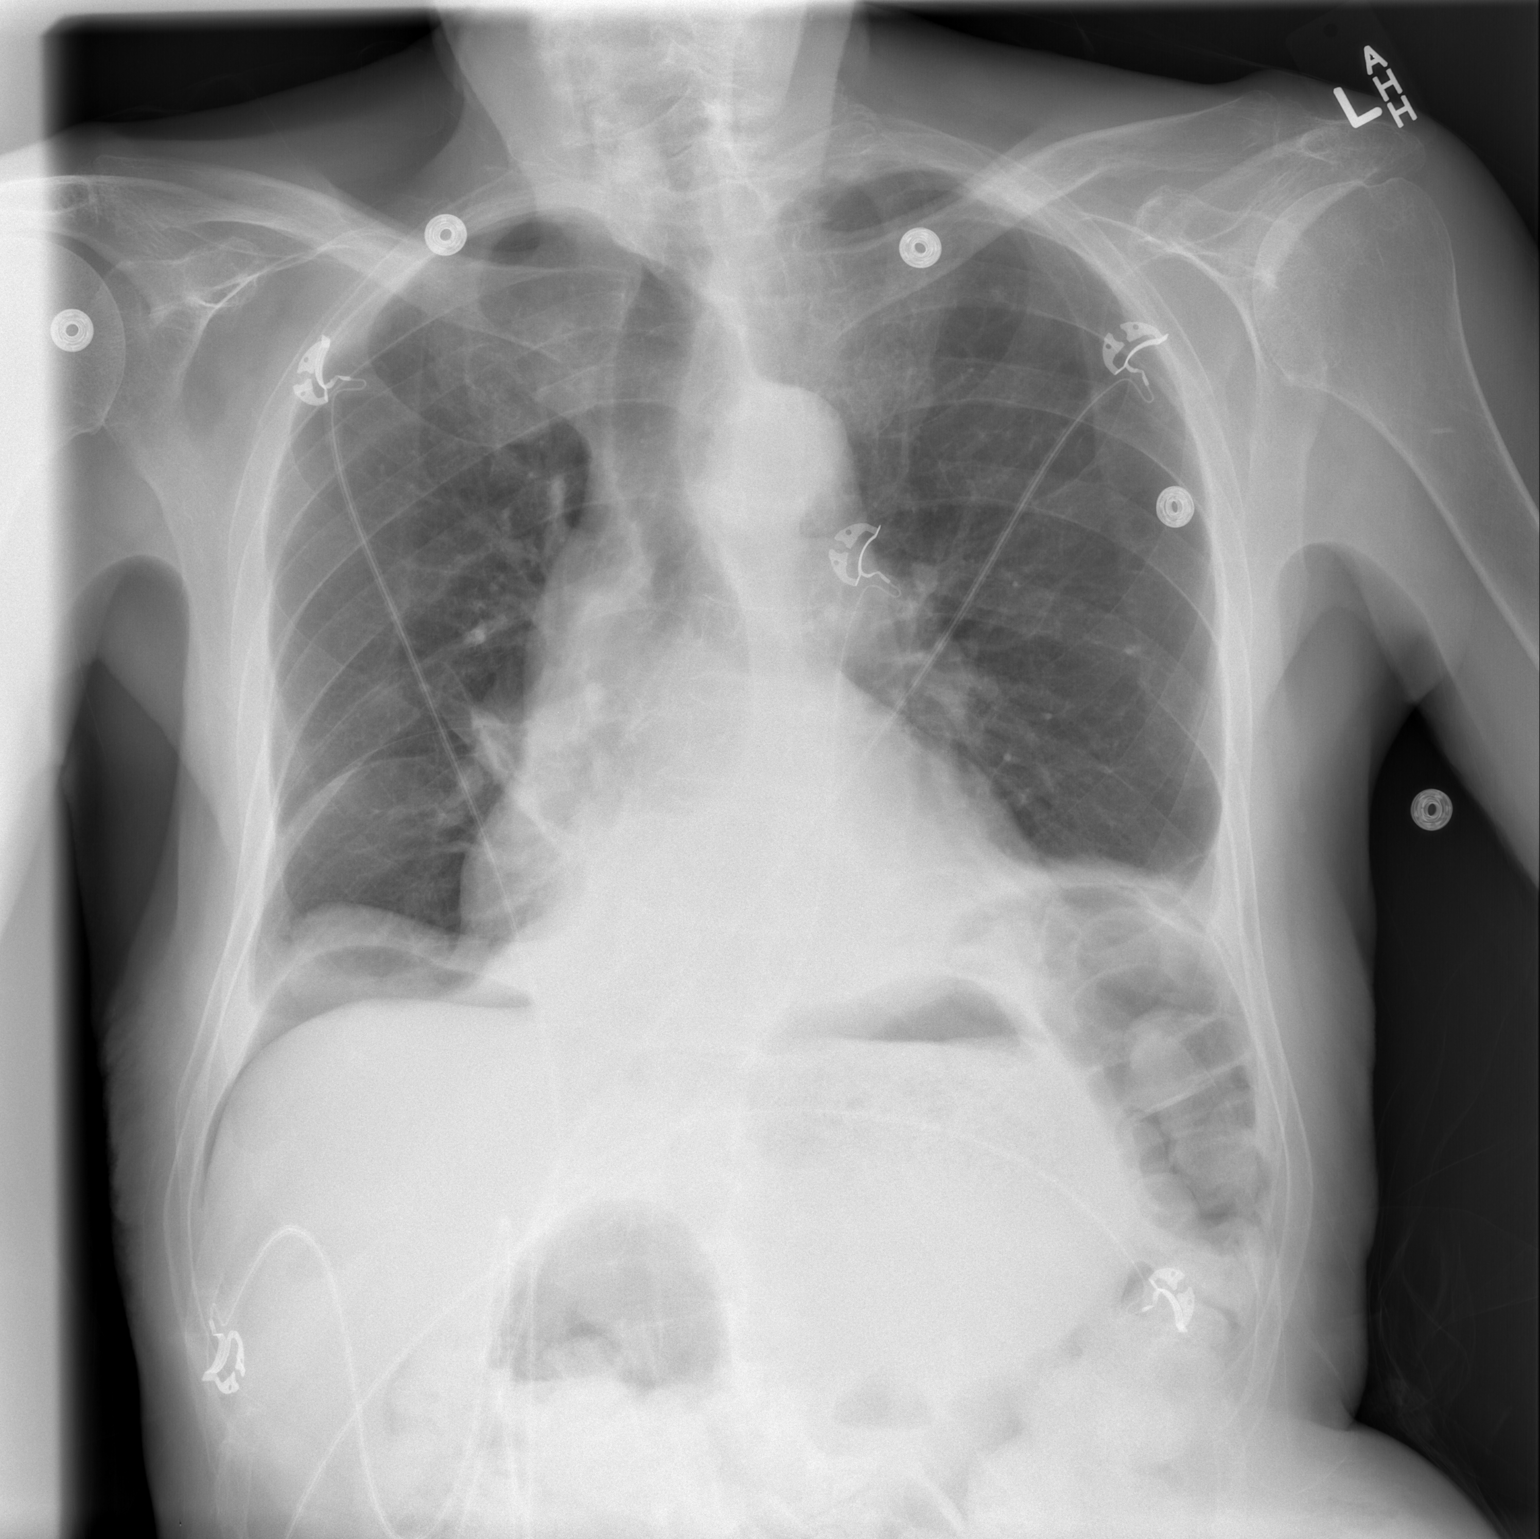

[w chest lat]
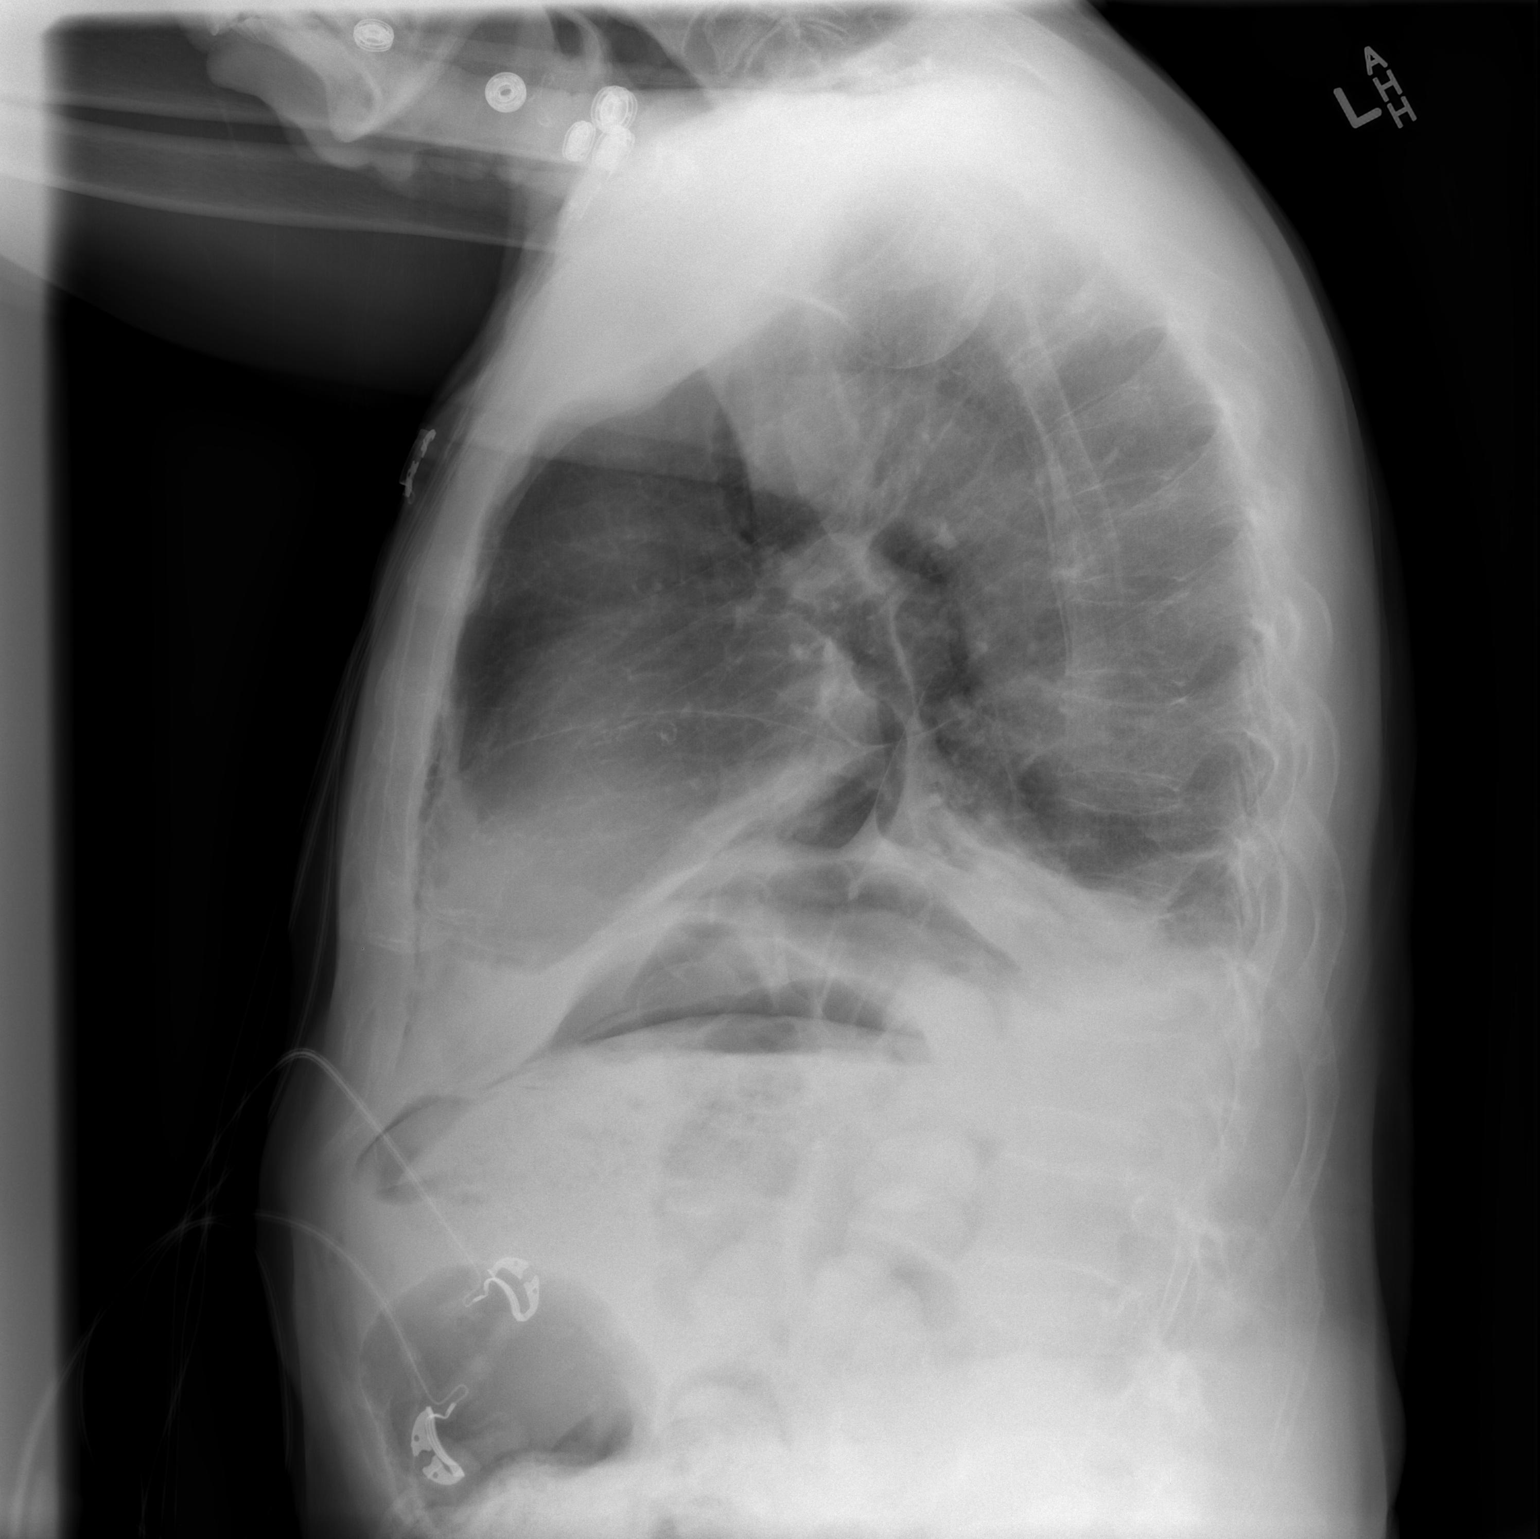

[2 of 2 positions shown; findings below may reference images not displayed]

FINDINGS: Cardiac and mediastinal contours are within normal limits. The
previously identified large hiatal hernia is no longer evident.
Inspiratory volumes are low. There are small bilateral pleural
effusions with associated bibasilar atelectasis. The mid and upper
lungs remain relatively well aerated. There is a moderate amount of
free air under the right hemidiaphragm. This was not clearly seen on
10/04/2014. No pneumothorax. No acute osseous abnormality.
IMPRESSION: 1. Free air under the right hemidiaphragm was not seen on the
limited upper GI study dated seen on 10/04/2014. While this may
represent redistribution of intra-abdominal air related to the
patient's recent surgical intervention, interval perforation of a
hollow viscus cannot be excluded radiographically. After discussing
with Dr. Niemeyer, redistribution of previously present
intra-abdominal air related to the prior surgery is favored. The
patient is clinically doing well without evidence of abdominal
tenderness or leukocytosis.
2. Small bilateral pleural effusions and associated bibasilar
atelectasis.
These results were called by telephone at the time of interpretation
on 10/07/2014 at [DATE] to Dr. RAIMO OLAVI NALLE , who verbally
acknowledged these results.

## 2016-02-20 DIAGNOSIS — L570 Actinic keratosis: Secondary | ICD-10-CM | POA: Diagnosis not present

## 2016-02-20 DIAGNOSIS — L821 Other seborrheic keratosis: Secondary | ICD-10-CM | POA: Diagnosis not present

## 2016-02-20 DIAGNOSIS — D1801 Hemangioma of skin and subcutaneous tissue: Secondary | ICD-10-CM | POA: Diagnosis not present

## 2016-03-02 DIAGNOSIS — R351 Nocturia: Secondary | ICD-10-CM | POA: Diagnosis not present

## 2016-03-02 DIAGNOSIS — Z0000 Encounter for general adult medical examination without abnormal findings: Secondary | ICD-10-CM | POA: Diagnosis not present

## 2016-03-10 ENCOUNTER — Emergency Department (HOSPITAL_COMMUNITY)
Admission: EM | Admit: 2016-03-10 | Discharge: 2016-03-11 | Disposition: A | Discharge status: Home / Self Care | Payer: Medicare Other | Attending: Emergency Medicine | Admitting: Emergency Medicine

## 2016-03-10 ENCOUNTER — Encounter (HOSPITAL_COMMUNITY): Payer: Self-pay | Admitting: Emergency Medicine

## 2016-03-10 ENCOUNTER — Emergency Department (HOSPITAL_COMMUNITY): Payer: Medicare Other

## 2016-03-10 DIAGNOSIS — J159 Unspecified bacterial pneumonia: Secondary | ICD-10-CM | POA: Insufficient documentation

## 2016-03-10 DIAGNOSIS — N400 Enlarged prostate without lower urinary tract symptoms: Secondary | ICD-10-CM | POA: Insufficient documentation

## 2016-03-10 DIAGNOSIS — F329 Major depressive disorder, single episode, unspecified: Secondary | ICD-10-CM | POA: Diagnosis not present

## 2016-03-10 DIAGNOSIS — Z880 Allergy status to penicillin: Secondary | ICD-10-CM | POA: Insufficient documentation

## 2016-03-10 DIAGNOSIS — Z8669 Personal history of other diseases of the nervous system and sense organs: Secondary | ICD-10-CM | POA: Diagnosis not present

## 2016-03-10 DIAGNOSIS — R030 Elevated blood-pressure reading, without diagnosis of hypertension: Secondary | ICD-10-CM | POA: Diagnosis not present

## 2016-03-10 DIAGNOSIS — K219 Gastro-esophageal reflux disease without esophagitis: Secondary | ICD-10-CM | POA: Diagnosis not present

## 2016-03-10 DIAGNOSIS — Z85828 Personal history of other malignant neoplasm of skin: Secondary | ICD-10-CM | POA: Insufficient documentation

## 2016-03-10 DIAGNOSIS — Z8781 Personal history of (healed) traumatic fracture: Secondary | ICD-10-CM | POA: Diagnosis not present

## 2016-03-10 DIAGNOSIS — M1990 Unspecified osteoarthritis, unspecified site: Secondary | ICD-10-CM | POA: Insufficient documentation

## 2016-03-10 DIAGNOSIS — M109 Gout, unspecified: Secondary | ICD-10-CM | POA: Diagnosis not present

## 2016-03-10 DIAGNOSIS — I25119 Atherosclerotic heart disease of native coronary artery with unspecified angina pectoris: Secondary | ICD-10-CM | POA: Insufficient documentation

## 2016-03-10 DIAGNOSIS — J189 Pneumonia, unspecified organism: Secondary | ICD-10-CM | POA: Diagnosis not present

## 2016-03-10 DIAGNOSIS — F419 Anxiety disorder, unspecified: Secondary | ICD-10-CM | POA: Insufficient documentation

## 2016-03-10 DIAGNOSIS — I129 Hypertensive chronic kidney disease with stage 1 through stage 4 chronic kidney disease, or unspecified chronic kidney disease: Secondary | ICD-10-CM | POA: Diagnosis not present

## 2016-03-10 DIAGNOSIS — E785 Hyperlipidemia, unspecified: Secondary | ICD-10-CM | POA: Insufficient documentation

## 2016-03-10 DIAGNOSIS — Z79899 Other long term (current) drug therapy: Secondary | ICD-10-CM | POA: Diagnosis not present

## 2016-03-10 DIAGNOSIS — Z7982 Long term (current) use of aspirin: Secondary | ICD-10-CM | POA: Insufficient documentation

## 2016-03-10 DIAGNOSIS — R05 Cough: Secondary | ICD-10-CM | POA: Diagnosis not present

## 2016-03-10 DIAGNOSIS — N183 Chronic kidney disease, stage 3 (moderate): Secondary | ICD-10-CM | POA: Diagnosis not present

## 2016-03-10 MED ORDER — LEVOFLOXACIN 750 MG PO TABS
750.0000 mg | ORAL_TABLET | Freq: Once | ORAL | Status: AC
  Administered 2016-03-11: 750 mg via ORAL
  Filled 2016-03-10: qty 1

## 2016-03-10 NOTE — ED Provider Notes (Signed)
CSN: [id number]     Arrival date & time 03/10/16  2156 History   First MD Initiated Contact with Patient 03/10/16 2241     Chief Complaint  Patient presents with  . Cough   HPI  CLAXTON LYU is an 82 y.o. male with history of HTN, HLD, CAD, GERD who presents to the ED from [place] for evaluation of a cough. He states the cough began this morning. He states it is a dry hacking cough. Denies fever, chills, chest pain, SOB. He states he is just here to make sure he does not have pneumonia.   Past Medical History  Diagnosis Date  . Hypertension   . Hyperlipemia   . Inguinal hernia without mention of obstruction or gangrene, unilateral or unspecified, (not specified as recurrent)   . CKD (chronic kidney disease), stage III   . Anxiety   . Osteopenia 2002  . BPH (benign prostatic hyperplasia)   . Osteoarthritis   . Gout   . CAD (coronary artery disease)   . GERD (gastroesophageal reflux disease)   . Vision disturbance   . Lumbar radiculopathy   . Vertigo 1999    negative ct scan head; rare recurrance, last in 2013  . Bleeding from the nose 2009    cautery by Dr. Erik Obey  . Cataract     right eye  . Impaired fasting glucose   . Skin cancer of forehead     left (BCE)  . Distal radius fracture 2013    Dr. Fredna Dow  . Major depressive disorder, single episode, unspecified ([place])   . Nosebleed     most recent 05/24/13  to have  cauterized prior to surgery   . Dysrhythmia     hx of atrial fib- 10 years ago   . H/O hiatal hernia   . Anginal pain [facility])    Past Surgical History  Procedure Laterality Date  . Tonsillectomy      at age 83 yrs old  . Eye surgery      right eye cataract surgery   . Inguinal hernia repair Right 06/07/2013    Procedure: LAPAROSCOPIC INGUINAL HERNIA REPAIR;  Surgeon: Adin Hector, MD;  Location: [facility];  Service: General;  Laterality: Right;  With Mesh  . Insertion of mesh Right 06/07/2013    Procedure: INSERTION OF MESH;  Surgeon: Adin Hector, MD;  Location: [facility];  Service: General;  Laterality: Right;  . Esophagogastroduodenoscopy N/A 07/10/2014    Procedure: ESOPHAGOGASTRODUODENOSCOPY (EGD);  Surgeon: Garlan Fair, MD;  Location: [facility];  Service: Endoscopy;  Laterality: N/A;  . Hiatal hernia repair N/A 10/03/2014    Procedure: LAPAROSCOPIC REPAIR GIANT HIATAL HERNIA, POSSIBLE OPEN;  Surgeon: Fanny Skates, MD;  Location: [facility];  Service: General;  Laterality: N/A;  . Gastrostomy N/A 10/03/2014    Procedure: GASTROSTOMY TUBE;  Surgeon: Fanny Skates, MD;  Location: [facility];  Service: General;  Laterality: N/A;   Family History  Problem Relation Age of Onset  . Heart disease Mother   . Heart disease Father   . Cancer Sister     breast    Social History  Substance Use Topics  . Smoking status: Never Smoker   . Smokeless tobacco: Never Used  . Alcohol Use: Yes     Comment: occasional    Review of Systems  All other systems reviewed and are negative.     Allergies  Penicillins  Home Medications   Prior to Admission medications  Medication Sig Start Date End Date Taking? Authorizing Provider  ALPRAZolam (XANAX) 0.25 MG tablet Take 0.25 mg by mouth 2 (two) times daily as needed for anxiety.   Yes Historical Provider, MD  amLODipine (NORVASC) 2.5 MG tablet Take 2.5 mg by mouth daily as needed (blood pressure).    Yes Historical Provider, MD  aspirin 81 MG tablet Take 81 mg by mouth daily.   Yes Historical Provider, MD  docusate sodium (COLACE) 100 MG capsule Take 1 capsule (100 mg total) by mouth every 12 (twelve) hours. Patient taking differently: Take 100 mg by mouth every other day.  09/14/15  Yes Charlesetta Shanks, MD  ENSURE PLUS (ENSURE PLUS) LIQD Take 237 mLs by mouth daily.   Yes Historical Provider, MD  lisinopril (PRINIVIL,ZESTRIL) 20 MG tablet Take 10 mg by mouth 2 (two) times daily.    Yes Historical Provider, MD  metoprolol tartrate (LOPRESSOR) 25 MG tablet Take 12.5 mg by mouth 2 (two)  times daily.   Yes Historical Provider, MD  omeprazole (PRILOSEC) 20 MG capsule Take 40 mg by mouth every morning.   Yes Historical Provider, MD  polyethylene glycol (MIRALAX / GLYCOLAX) packet Take 17 g by mouth daily. 09/14/15  Yes Charlesetta Shanks, MD  pravastatin (PRAVACHOL) 20 MG tablet Take 20 mg by mouth at bedtime.   Yes Historical Provider, MD  tamsulosin (FLOMAX) 0.4 MG CAPS Take 0.4 mg by mouth at bedtime.    Yes Historical Provider, MD   BP 171/97 mmHg  Pulse 70  Temp(Src) 97.8 F (36.6 C) (Oral)  SpO2 98% Physical Exam  Constitutional: He is oriented to person, place, and time.  HENT:  Right Ear: External ear normal.  Left Ear: External ear normal.  Nose: Nose normal.  Mouth/Throat: Oropharynx is clear and moist. No oropharyngeal exudate.  Eyes: Conjunctivae and EOM are normal. Pupils are equal, round, and reactive to light.  Neck: Normal range of motion. Neck supple.  Cardiovascular: Normal rate, regular rhythm, normal heart sounds and intact distal pulses.   Pulmonary/Chest: Effort normal and breath sounds normal. No respiratory distress. He has no wheezes. He has no rales.  Abdominal: Soft. Bowel sounds are normal. He exhibits no distension. There is no tenderness. There is no rebound and no guarding.  Musculoskeletal: He exhibits no edema.  Neurological: He is alert and oriented to person, place, and time. No cranial nerve deficit.  Skin: Skin is warm and dry.  Psychiatric: He has a normal mood and affect.  Nursing note and vitals reviewed.   ED Course  Procedures (including critical care time) Labs Review Labs Reviewed - No data to display  Imaging Review Dg Chest 2 View  03/10/2016  CLINICAL DATA:  Acute onset of cough.  Initial encounter. EXAM: CHEST  2 VIEW COMPARISON:  Chest radiograph performed 10/07/2014 FINDINGS: The lungs are well-aerated. Mild left basilar airspace opacity raises concern for pneumonia. There is no evidence of pleural effusion or  pneumothorax. The heart is borderline normal in size. No acute osseous abnormalities are seen. IMPRESSION: Mild left basilar airspace opacity raises concern for pneumonia. Electronically Signed   By: Garald Balding M.D.   On: 03/10/2016 23:09   I have personally reviewed and evaluated these images and lab results as part of my medical decision-making.   EKG Interpretation None      MDM   Final diagnoses:  Healthcare-associated pneumonia    CXR reveals left basilar pneumonia. Pt seen and evaluated by attending Dr. Betsey Holiday as well. At this time  pt is nontoxic appearing with no fever, hypoxia, tachypnea. Lungs CTAB. We will hold off on further workup at this time. First dose of Levaquin given in the ED with rx to complete course. Instructed close f/u with PCP this week. Strict ER return precautions given.     Anne Ng, PA-C 03/11/16 [place], MD 03/11/16 [phone]

## 2016-03-10 NOTE — ED Notes (Addendum)
Pt from [facility] with c/o a cough that began today. Pt states his cough is unproductive, but his mouth feels "sticky". Pt denies cp, n, v, d. Pt is alert and oriented x 4 and states he once had pneumonia and he wanted to make sure he did not have that. Pt states he does not want his family called because he wants to be in and out Pt states that now that he has been coughing, he has throat pain that he rates at a 5/10.

## 2016-03-10 NOTE — ED Notes (Signed)
Bed: [id number] Expected date:  Expected time:  Means of arrival:  Comments: [id number] [facility]

## 2016-03-11 DIAGNOSIS — J159 Unspecified bacterial pneumonia: Secondary | ICD-10-CM | POA: Diagnosis not present

## 2016-03-11 DIAGNOSIS — J189 Pneumonia, unspecified organism: Secondary | ICD-10-CM | POA: Diagnosis not present

## 2016-03-11 MED ORDER — LEVOFLOXACIN 750 MG PO TABS: 750.0000 mg | ORAL_TABLET | Freq: Every day | ORAL | Status: DC

## 2016-03-11 NOTE — ED Provider Notes (Signed)
Patient presented to the ER with cough. Patient reports that his cough began earlier today. Cough is nonproductive. He is not short of breath.  Face to face Exam: HEENT - PERRLA Lungs - CTAB Heart - RRR, no M/R/G Abd - S/NT/ND Neuro - alert, oriented x3  Plan: Patient has clear lung fields on auscultation. He has normal oxygen saturation. He is afebrile. All vital signs are normal except for mild hypertension that is asymptomatic. Chest x-ray shows evidence of pneumonia. As patient appears well, will initiate Levaquin and have prompt follow-up with primary care. Patient counseled return to the ER immediately if he develops any high fevers or difficulty breathing.  Orpah Greek, MD 03/11/16 [phone]

## 2016-03-11 NOTE — Discharge Instructions (Signed)
Your chest x-ray shows evidence of pneumonia. We will give you a course of antibiotics (Levaquin). We gave you the first dose here and I will give you a prescription for six more days for a total of seven days of antibiotics. Please see your primary care provider this week for a follow up. Return to the ER for new or worsening symptoms.

## 2016-03-12 DIAGNOSIS — J189 Pneumonia, unspecified organism: Secondary | ICD-10-CM | POA: Diagnosis not present

## 2016-03-16 ENCOUNTER — Ambulatory Visit
Admission: RE | Admit: 2016-03-16 | Discharge: 2016-03-16 | Disposition: A | Discharge status: Home / Self Care | Payer: Medicare Other | Source: Ambulatory Visit | Attending: Internal Medicine | Admitting: Internal Medicine

## 2016-03-16 ENCOUNTER — Other Ambulatory Visit: Payer: Self-pay | Admitting: Internal Medicine

## 2016-03-16 DIAGNOSIS — J189 Pneumonia, unspecified organism: Secondary | ICD-10-CM

## 2016-03-23 DIAGNOSIS — R5383 Other fatigue: Secondary | ICD-10-CM | POA: Diagnosis not present

## 2016-03-23 DIAGNOSIS — J189 Pneumonia, unspecified organism: Secondary | ICD-10-CM | POA: Diagnosis not present

## 2016-04-09 ENCOUNTER — Encounter: Payer: Self-pay | Admitting: Podiatry

## 2016-04-09 ENCOUNTER — Ambulatory Visit (INDEPENDENT_AMBULATORY_CARE_PROVIDER_SITE_OTHER): Payer: Medicare Other | Admitting: Podiatry

## 2016-04-09 DIAGNOSIS — B351 Tinea unguium: Secondary | ICD-10-CM | POA: Diagnosis not present

## 2016-04-09 DIAGNOSIS — M79676 Pain in unspecified toe(s): Secondary | ICD-10-CM | POA: Diagnosis not present

## 2016-04-09 DIAGNOSIS — M79609 Pain in unspecified limb: Principal | ICD-10-CM

## 2016-04-09 NOTE — Progress Notes (Signed)
   Subjective:    Patient ID: Larry Mcfarland, male    DOB: 1921/06/29, 82 y.o.   MRN: [id number]  HPI this patient presents to the office with chief complaint of long thick painful nails. He states his nails have grown thick and long, but especially the big toe nail on the left foot is painful. He states the nails are painful walking and wearing his shoes. He presents the office for preventive foot care services.      Review of Systems  All other systems reviewed and are negative.      Objective:   Physical Exam GENERAL APPEARANCE: Alert, conversant. Appropriately groomed. No acute distress.  VASCULAR: Pedal pulses palpable at  [facility] and PT bilateral.  Capillary refill time is immediate to all digits,  Normal temperature gradient.  Digital hair growth is present bilateral  NEUROLOGIC: sensation is normal to 5.07 monofilament at 5/5 sites bilateral.  Light touch is intact bilateral, Muscle strength normal.  MUSCULOSKELETAL: acceptable muscle strength, tone and stability bilateral.  Intrinsic muscluature intact bilateral.  Rectus appearance of foot and digits noted bilateral.   DERMATOLOGIC: skin color, texture, and turgor are within normal limits.  No preulcerative lesions or ulcers  are seen, no interdigital maceration noted.  No open lesions present.  . No drainage noted. NAILS  Thick disfigured discolored nails both feet.        Assessment & Plan:  Onychomycosis B/L  IE  Debride Nails  RTC 3 months.   Gardiner Barefoot DPM

## 2016-04-14 DIAGNOSIS — M7042 Prepatellar bursitis, left knee: Secondary | ICD-10-CM | POA: Diagnosis not present

## 2016-04-30 DIAGNOSIS — R05 Cough: Secondary | ICD-10-CM | POA: Diagnosis not present

## 2016-04-30 DIAGNOSIS — N183 Chronic kidney disease, stage 3 (moderate): Secondary | ICD-10-CM | POA: Diagnosis not present

## 2016-05-15 DIAGNOSIS — R05 Cough: Secondary | ICD-10-CM | POA: Diagnosis not present

## 2016-05-18 ENCOUNTER — Other Ambulatory Visit (HOSPITAL_COMMUNITY): Payer: Self-pay | Admitting: Internal Medicine

## 2016-05-18 DIAGNOSIS — R1310 Dysphagia, unspecified: Secondary | ICD-10-CM

## 2016-05-21 ENCOUNTER — Ambulatory Visit (HOSPITAL_COMMUNITY)
Admission: RE | Admit: 2016-05-21 | Discharge: 2016-05-21 | Disposition: A | Discharge status: Home / Self Care | Payer: Medicare Other | Source: Ambulatory Visit | Attending: Internal Medicine | Admitting: Internal Medicine

## 2016-05-21 DIAGNOSIS — R1310 Dysphagia, unspecified: Secondary | ICD-10-CM

## 2016-05-21 DIAGNOSIS — Z79899 Other long term (current) drug therapy: Secondary | ICD-10-CM | POA: Insufficient documentation

## 2016-05-21 DIAGNOSIS — R05 Cough: Secondary | ICD-10-CM | POA: Diagnosis not present

## 2016-05-21 DIAGNOSIS — Z8701 Personal history of pneumonia (recurrent): Secondary | ICD-10-CM | POA: Diagnosis not present

## 2016-05-21 DIAGNOSIS — Z7982 Long term (current) use of aspirin: Secondary | ICD-10-CM | POA: Diagnosis not present

## 2016-05-21 DIAGNOSIS — N183 Chronic kidney disease, stage 3 (moderate): Secondary | ICD-10-CM | POA: Diagnosis not present

## 2016-05-21 DIAGNOSIS — I2510 Atherosclerotic heart disease of native coronary artery without angina pectoris: Secondary | ICD-10-CM | POA: Insufficient documentation

## 2016-05-21 DIAGNOSIS — F419 Anxiety disorder, unspecified: Secondary | ICD-10-CM | POA: Insufficient documentation

## 2016-05-21 DIAGNOSIS — K219 Gastro-esophageal reflux disease without esophagitis: Secondary | ICD-10-CM | POA: Insufficient documentation

## 2016-05-21 DIAGNOSIS — I129 Hypertensive chronic kidney disease with stage 1 through stage 4 chronic kidney disease, or unspecified chronic kidney disease: Secondary | ICD-10-CM | POA: Insufficient documentation

## 2016-05-21 DIAGNOSIS — N400 Enlarged prostate without lower urinary tract symptoms: Secondary | ICD-10-CM | POA: Insufficient documentation

## 2016-05-21 DIAGNOSIS — T17308A Unspecified foreign body in larynx causing other injury, initial encounter: Secondary | ICD-10-CM | POA: Diagnosis not present

## 2016-05-21 DIAGNOSIS — R1313 Dysphagia, pharyngeal phase: Secondary | ICD-10-CM | POA: Insufficient documentation

## 2016-05-21 NOTE — Progress Notes (Signed)
MBSS complete. Full report located under chart review in imaging section.  Leah McCoy MA, CCC-SLP [phone]   

## 2016-06-09 DIAGNOSIS — R1310 Dysphagia, unspecified: Secondary | ICD-10-CM | POA: Diagnosis not present

## 2016-06-09 DIAGNOSIS — R05 Cough: Secondary | ICD-10-CM | POA: Diagnosis not present

## 2016-06-30 DIAGNOSIS — R05 Cough: Secondary | ICD-10-CM | POA: Diagnosis not present

## 2016-07-03 DIAGNOSIS — H0014 Chalazion left upper eyelid: Secondary | ICD-10-CM | POA: Diagnosis not present

## 2016-07-09 ENCOUNTER — Ambulatory Visit (INDEPENDENT_AMBULATORY_CARE_PROVIDER_SITE_OTHER): Payer: Medicare Other | Admitting: Podiatry

## 2016-07-09 ENCOUNTER — Encounter: Payer: Self-pay | Admitting: Podiatry

## 2016-07-09 DIAGNOSIS — M79676 Pain in unspecified toe(s): Secondary | ICD-10-CM

## 2016-07-09 DIAGNOSIS — M79609 Pain in unspecified limb: Principal | ICD-10-CM

## 2016-07-09 DIAGNOSIS — B351 Tinea unguium: Secondary | ICD-10-CM | POA: Diagnosis not present

## 2016-07-09 NOTE — Progress Notes (Signed)
   Subjective:    Patient ID: Larry Mcfarland, male    DOB: Jun 18, 1921, 82 y.o.   MRN: [id number]  HPI this patient presents to the office with chief complaint of long thick painful nails. He states his nails have grown thick and long, but especially the big toe nail on the left foot is painful. He states the nails are painful walking and wearing his shoes. He presents the office for preventive foot care services.      Review of Systems  All other systems reviewed and are negative.      Objective:   Physical Exam GENERAL APPEARANCE: Alert, conversant. Appropriately groomed. No acute distress.  VASCULAR: Pedal pulses palpable at  [facility] and PT bilateral.  Capillary refill time is immediate to all digits,  Normal temperature gradient.  Digital hair growth is present bilateral  NEUROLOGIC: sensation is normal to 5.07 monofilament at 5/5 sites bilateral.  Light touch is intact bilateral, Muscle strength normal.  MUSCULOSKELETAL: acceptable muscle strength, tone and stability bilateral.  Intrinsic muscluature intact bilateral. Plantarflexed second third metatarsals  B/L with hammer toes B/L.  DERMATOLOGIC: skin color, texture, and turgor are within normal limits.  No preulcerative lesions or ulcers  are seen, no interdigital maceration noted.  No open lesions present.  . No drainage noted. NAILS  Thick disfigured discolored nails both feet.        Assessment & Plan:  Onychomycosis B/L  IE  Debride Nails  RTC 3 months. Recommend spenco insoles for forefoot left pain.   Gardiner Barefoot DPM

## 2016-08-17 DIAGNOSIS — R011 Cardiac murmur, unspecified: Secondary | ICD-10-CM | POA: Diagnosis not present

## 2016-08-17 DIAGNOSIS — R05 Cough: Secondary | ICD-10-CM | POA: Diagnosis not present

## 2016-08-17 DIAGNOSIS — R0600 Dyspnea, unspecified: Secondary | ICD-10-CM | POA: Diagnosis not present

## 2016-08-17 DIAGNOSIS — Z23 Encounter for immunization: Secondary | ICD-10-CM | POA: Diagnosis not present

## 2016-08-18 ENCOUNTER — Other Ambulatory Visit: Payer: Self-pay | Admitting: Internal Medicine

## 2016-08-18 DIAGNOSIS — R0600 Dyspnea, unspecified: Secondary | ICD-10-CM

## 2016-08-24 ENCOUNTER — Ambulatory Visit (HOSPITAL_COMMUNITY): Payer: Medicare Other | Attending: Cardiovascular Disease

## 2016-08-24 ENCOUNTER — Other Ambulatory Visit: Payer: Self-pay

## 2016-08-24 DIAGNOSIS — I2510 Atherosclerotic heart disease of native coronary artery without angina pectoris: Secondary | ICD-10-CM | POA: Insufficient documentation

## 2016-08-24 DIAGNOSIS — R0600 Dyspnea, unspecified: Secondary | ICD-10-CM | POA: Diagnosis not present

## 2016-08-24 DIAGNOSIS — N189 Chronic kidney disease, unspecified: Secondary | ICD-10-CM | POA: Insufficient documentation

## 2016-08-24 DIAGNOSIS — E785 Hyperlipidemia, unspecified: Secondary | ICD-10-CM | POA: Diagnosis not present

## 2016-08-24 DIAGNOSIS — I071 Rheumatic tricuspid insufficiency: Secondary | ICD-10-CM | POA: Insufficient documentation

## 2016-08-24 DIAGNOSIS — I351 Nonrheumatic aortic (valve) insufficiency: Secondary | ICD-10-CM | POA: Insufficient documentation

## 2016-08-24 DIAGNOSIS — I340 Nonrheumatic mitral (valve) insufficiency: Secondary | ICD-10-CM | POA: Insufficient documentation

## 2016-08-24 DIAGNOSIS — I1310 Hypertensive heart and chronic kidney disease without heart failure, with stage 1 through stage 4 chronic kidney disease, or unspecified chronic kidney disease: Secondary | ICD-10-CM | POA: Insufficient documentation

## 2016-08-24 DIAGNOSIS — Z8249 Family history of ischemic heart disease and other diseases of the circulatory system: Secondary | ICD-10-CM | POA: Diagnosis not present

## 2016-08-24 NOTE — Progress Notes (Signed)
Late entry for MBS complete on 05/21/16  MBS results: Patient presents with a mild dysphagia, with pharyngeal, anatomical, and suspected GI components. Pharyngeal swallow timely with initial full airway protection. Combination of mildly decreased movement of hyo-laryngeal complex, anterior curvature of C-spine limiting full deflection of epiglottis, and mildly decreased UES relaxation result in mild-moderate pharyngeal residuals post swallow which are penetrated with liquids only. Penetrates consistently sensed and expelled from laryngeal vestibule with strong throat clear and dry swallow. Recommend continuation of current diet with compensatory strategies to decrease risk of aspiration. Education complete with patient.    Recommendations: Regular diet, thin liquid Meds whole with liquid Slow rate of intake, small bites/sips, clear throat intermittently Seated upright at 90 degrees  [facility] MA, CCC-SLP [phone]

## 2016-08-24 NOTE — Addendum Note (Signed)
Encounter addended by: Eloise Harman, CCC-SLP on: 08/24/2016 11:16 AM<BR>    Actions taken: Flowsheet accepted, Sign clinical note

## 2016-08-24 NOTE — Progress Notes (Signed)
SLP MBS addendum: Late entry for 05/21/16    05/21/16 1200  Clinical Impression  Therapy Diagnosis Mild cervical esophageal phase dysphagia;Mild pharyngeal phase dysphagia (dysphagia, pharyngoesophageal phase)  Clinical Impression Patient presents with a mild dysphagia, with pharyngeal, anatomical, and suspected GI components. Pharyngeal swallow timely with initial full airway protection. Combination of mildly decreased movement of hyo-laryngeal complex, anterior curvature of C-spine limiting full deflection of epiglottis, and mildly decreased UES relaxation result in mild-moderate pharyngeal residuals post swallow which are penetrated with liquids only. Penetrates consistently sensed and expelled from laryngeal vestibule with strong throat clear and dry swallow. Recommend continuation of current diet with compensatory strategies to decrease risk of aspiration. Education complete with patient.    Impact on safety and function Mild aspiration risk   Gabriel Rainwater MA, CCC-SLP [phone]

## 2016-08-31 ENCOUNTER — Other Ambulatory Visit (HOSPITAL_COMMUNITY): Payer: Medicare Other

## 2016-08-31 DIAGNOSIS — R0600 Dyspnea, unspecified: Secondary | ICD-10-CM | POA: Diagnosis not present

## 2016-08-31 DIAGNOSIS — R5383 Other fatigue: Secondary | ICD-10-CM | POA: Diagnosis not present

## 2016-08-31 DIAGNOSIS — I340 Nonrheumatic mitral (valve) insufficiency: Secondary | ICD-10-CM | POA: Diagnosis not present

## 2016-09-02 DIAGNOSIS — R351 Nocturia: Secondary | ICD-10-CM | POA: Diagnosis not present

## 2016-09-10 ENCOUNTER — Encounter (INDEPENDENT_AMBULATORY_CARE_PROVIDER_SITE_OTHER): Payer: Self-pay

## 2016-09-10 ENCOUNTER — Encounter: Payer: Self-pay | Admitting: Cardiovascular Disease

## 2016-09-10 ENCOUNTER — Ambulatory Visit (INDEPENDENT_AMBULATORY_CARE_PROVIDER_SITE_OTHER): Payer: Medicare Other | Admitting: Cardiovascular Disease

## 2016-09-10 VITALS — BP 124/68 | HR 76 | Ht 67.0 in | Wt 143.2 lb

## 2016-09-10 DIAGNOSIS — I340 Nonrheumatic mitral (valve) insufficiency: Secondary | ICD-10-CM

## 2016-09-10 NOTE — Patient Instructions (Addendum)
Medication Instructions:  Your physician recommends that you continue on your current medications as directed. Please refer to the Current Medication list given to you today.   Labwork: none  Testing/Procedures: none  Follow-Up: Your physician recommends that you schedule a follow-up appointment in: about 2 weeks.--Scheduled for October 26,2017 at 9:30    Any Other Special Instructions Will Be Listed Below (If Applicable).     If you need a refill on your cardiac medications before your next appointment, please call your pharmacy.

## 2016-09-10 NOTE — Progress Notes (Addendum)
Chief Complaint  Patient presents with  . Fatigue     History of Present Illness: 82 up male with history of chronic kidney disease, paroxysmal atrial fibrillation, GERD, HTN, HLD who is here today as a new patient in the valve clinic to discuss the new finding of severe mitral regurgitation. He has been seen in our office by Dr. Johnsie Cancel in October 2015. He is followed in primary care by Dr. Delfina Redwood. He has remote atrial fibrillation but no recent recurrences. He was recently noted to have a systolic murmur. Echo 08/24/16 with normal LV systolic function severe mitral regurgitation, moderate TR and elevated PA pressures.   He tells me today that he has been having more fatigue lately. He gets tired easily. He has minimal dyspnea with exertion. No chest pain. He denies ankle edema. He is retired from Manufacturing engineer. He is a widow. He lives in [place]. He has a male friend but did not get remarried after his wife passed away 5 years ago. Both sons live out of state. He drives and does his own shopping.   Primary Care Physician: Kandice Hams, MD   Past Medical History:  Diagnosis Date  . Anginal pain ([place])   . Anxiety   . Bleeding from the nose 2009   cautery by Dr. Erik Obey  . BPH (benign prostatic hyperplasia)   . CAD (coronary artery disease)   . Cataract    right eye  . CKD (chronic kidney disease), stage III   . Distal radius fracture 2013   Dr. Fredna Dow  . Dysrhythmia    hx of atrial fib- 10 years ago   . GERD (gastroesophageal reflux disease)   . Gout   . H/O hiatal hernia   . Hyperlipemia   . Hypertension   . Impaired fasting glucose   . Inguinal hernia without mention of obstruction or gangrene, unilateral or unspecified, (not specified as recurrent)   . Lumbar radiculopathy   . Major depressive disorder, single episode, unspecified   . Nosebleed    most recent 05/24/13  to have  cauterized prior to surgery   . Osteoarthritis   . Osteopenia 2002  . Skin cancer of  forehead    left (BCE)  . Vertigo 1999   negative ct scan head; rare recurrance, last in 2013  . Vision disturbance     Past Surgical History:  Procedure Laterality Date  . ESOPHAGOGASTRODUODENOSCOPY N/A 07/10/2014   Procedure: ESOPHAGOGASTRODUODENOSCOPY (EGD);  Surgeon: Garlan Fair, MD;  Location: [facility];  Service: Endoscopy;  Laterality: N/A;  . EYE SURGERY     right eye cataract surgery   . GASTROSTOMY N/A 10/03/2014   Procedure: GASTROSTOMY TUBE;  Surgeon: Fanny Skates, MD;  Location: [facility];  Service: General;  Laterality: N/A;  . HIATAL HERNIA REPAIR N/A 10/03/2014   Procedure: [place], POSSIBLE OPEN;  Surgeon: Fanny Skates, MD;  Location: [facility];  Service: General;  Laterality: N/A;  . INGUINAL HERNIA REPAIR Right 06/07/2013   Procedure: LAPAROSCOPIC INGUINAL HERNIA REPAIR;  Surgeon: Adin Hector, MD;  Location: [facility];  Service: General;  Laterality: Right;  With Mesh  . INSERTION OF MESH Right 06/07/2013   Procedure: INSERTION OF MESH;  Surgeon: Adin Hector, MD;  Location: [facility];  Service: General;  Laterality: Right;  . TONSILLECTOMY     at age 29 yrs old    Current Outpatient Prescriptions  Medication Sig Dispense Refill  . ALPRAZolam (XANAX) 0.25 MG tablet Take  0.25 mg by mouth 2 (two) times daily as needed for anxiety.    Marland Kitchen aspirin 81 MG tablet Take 81 mg by mouth daily.    Marland Kitchen docusate sodium (COLACE) 100 MG capsule Take 1 capsule (100 mg total) by mouth every 12 (twelve) hours. (Patient taking differently: Take 100 mg by mouth every other day. ) 60 capsule 0  . losartan (COZAAR) 25 MG tablet Take 25 mg by mouth daily.    . metoprolol tartrate (LOPRESSOR) 25 MG tablet Take 12.5 mg by mouth 2 (two) times daily.    . mirabegron ER (MYRBETRIQ) 25 MG TB24 tablet Take 25 mg by mouth daily.    Marland Kitchen omeprazole (PRILOSEC) 20 MG capsule Take 40 mg by mouth every morning.    . polyethylene glycol (MIRALAX / GLYCOLAX) packet Take 17 g  by mouth daily. 14 each 0  . pravastatin (PRAVACHOL) 20 MG tablet Take 20 mg by mouth at bedtime.    . tamsulosin (FLOMAX) 0.4 MG CAPS Take 0.4 mg by mouth at bedtime.      No current facility-administered medications for this visit.     Allergies  Allergen Reactions  . Penicillins Nausea Only    Upset stomach .     Social History   Social History  . Marital status: Widowed    Spouse name: N/A  . Number of children: 2  . Years of education: N/A   Occupational History  . 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 Smoking status: Never Smoker  . Smokeless tobacco: Never Used  . Alcohol use Yes     Comment: occasional  . Drug use: No  . Sexual activity: Not on file   Other Topics Concern  . Not on file   Social History Narrative  . No narrative on file    Family History  Problem Relation Age of Onset  . Heart disease Mother   . Heart disease Father   . Cancer Sister     breast     Review of Systems:  As stated in the HPI and otherwise negative.   BP 124/68   Pulse 76   Ht 5\' 7"  (1.702 m)   Wt 143 lb 3.2 oz (65 kg)   BMI 22.43 kg/m   Physical Examination: General: Well developed, well nourished, NAD  HEENT: OP clear, mucus membranes moist  SKIN: warm, dry. No rashes. Neuro: No focal deficits  Musculoskeletal: Muscle strength 5/5 all ext  Psychiatric: Mood and affect normal  Neck: No JVD, no carotid bruits, no thyromegaly, no lymphadenopathy.  Lungs:Clear bilaterally, no wheezes, rhonci, crackles Cardiovascular: Regular rate and rhythm. Loud blowing systolic murmur. No gallops or rubs. Abdomen:Soft. Bowel sounds present. Non-tender.  Extremities: No lower extremity edema. Pulses are 2 + in the bilateral DP/PT.  Echo 08/24/16: Left ventricle: The cavity size was normal. There was moderate   concentric hypertrophy. Systolic function was normal. The   estimated ejection fraction was in the range of 60% to  65%. Wall   motion was normal; there were no regional wall motion   abnormalities. Features are consistent with a pseudonormal left   ventricular filling pattern, with concomitant abnormal relaxation   and increased filling pressure (grade 2 diastolic dysfunction). - Aortic valve: There was trivial regurgitation. - Mitral valve: Probable, severe flail motion involving the lateral   scallop of the posterior leaflet due to rupture of one or more   chords. There was severe  regurgitation directed eccentrically and   toward the septum. Severe regurgitation is suggested by pulmonary   vein systolic flow reversal. - Left atrium: The atrium was moderately dilated. - Tricuspid valve: There was mild-moderate regurgitation directed   centrally. - Pulmonary arteries: Systolic pressure was moderately increased.   PA peak pressure: 56 mm Hg (S).  Recommendations:  Consider transesophageal echocardiography to better identify the anatomical abnormality leading to severe mitral insufficiency.  EKG:  EKG is ordered today. The ekg ordered today demonstrates sinus, rate 76 bpm.   Recent Labs: 09/14/2015: ALT 20; BUN 23; Creatinine, Ser 1.64; Hemoglobin 12.6; Platelets 171; Potassium 4.1; Sodium 136   Lipid Panel No results found for: CHOL, TRIG, HDL, CHOLHDL, VLDL, LDLCALC, LDLDIRECT   Wt Readings from Last 3 Encounters:  09/10/16 143 lb 3.2 oz (65 kg)  09/14/15 140 lb (63.5 kg)  10/22/14 127 lb 6.4 oz (57.8 kg)     Other studies Reviewed: Additional studies/ records that were reviewed today include: . Review of the above records demonstrates:   Assessment and Plan:   1. 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 He is having more fatigue but minimal dyspnea or signs of CHF. LV systolic function is normal. We have discussed options for further testing including a TEE to better assess the mitral valve and etiology of  his severe MR. He is not sure that he wishes to pursue aggressive invasive measures. He has no family here today. I have discussed the possibility of mitral valve surgery. Even if he were to agree to a surgical evaluation, he may not be considered a surgical candidate given his advanced age. I will call his son today to discuss his disease process at the patients request. I will see him back in several weeks. If he agrees to move forward, will arrange TEE and then an appointment with CT surgery. If he is felt to be a surgical candidate, would need right and left heart catheterization. If he is not felt to be a candidate for open surgical procedure, I would consider referring him to another center for consideration for the Mitraclip procedure.   Current medicines are reviewed at length with the patient today.  The patient does not have concerns regarding medicines.  The following changes have been made:  no change  Labs/ tests ordered today include:   Orders Placed This Encounter  Procedures  . EKG 12-Lead     Disposition:   FU with me in 2 weeks   Signed, Lauree Chandler, MD 09/10/2016 12:39 PM    [address], [place], [place]  [address] Phone: [phone]; Fax: [phone]

## 2016-09-14 ENCOUNTER — Telehealth: Payer: Self-pay | Admitting: Cardiovascular Disease

## 2016-09-14 NOTE — Telephone Encounter (Signed)
Pt.notified

## 2016-09-14 NOTE — Telephone Encounter (Signed)
I would delay dental appointments until we have his valve issue addressed. Gerald Stabs

## 2016-09-14 NOTE — Telephone Encounter (Signed)
Mr.Kissler has an dental appointment on wed this week he wonders with his heart problems if the appointment is okay

## 2016-09-19 DIAGNOSIS — R079 Chest pain, unspecified: Secondary | ICD-10-CM | POA: Diagnosis not present

## 2016-09-23 NOTE — Progress Notes (Signed)
Chief Complaint  Patient presents with  . Fatigue     History of Present Illness: 82 up male with history of chronic kidney disease, paroxysmal atrial fibrillation, GERD, HTN, HLD who is here today for follow up in the valve clinic to discuss the new finding of severe mitral regurgitation. He has been seen in our office by Dr. Johnsie Cancel in October 2015. He is followed in primary care by Dr. Delfina Redwood. He has remote atrial fibrillation but no recent recurrences. He was recently noted to have a systolic murmur. Echo 08/24/16 with normal LV systolic function severe mitral regurgitation, moderate TR and elevated PA pressures. I saw him 2 weeks ago to discuss his valve disease.   He tells me today that he continues to have fatigue but no dyspnea. He has rare chest pains, last a few seconds. He has rare palpitations. He denies ankle edema. He is retired from Manufacturing engineer. He is a widow. He lives in [place]. He has a male friend but did not get remarried after his wife passed away 5 years ago. Both sons live out of state. He drives and does his own shopping.   Primary Care Physician: Kandice Hams, MD   Past Medical History:  Diagnosis Date  . Anginal pain ([place])   . Anxiety   . Bleeding from the nose 2009   cautery by Dr. Erik Obey  . BPH (benign prostatic hyperplasia)   . CAD (coronary artery disease)   . Cataract    right eye  . CKD (chronic kidney disease), stage III   . Distal radius fracture 2013   Dr. Fredna Dow  . Dysrhythmia    hx of atrial fib- 10 years ago   . GERD (gastroesophageal reflux disease)   . Gout   . H/O hiatal hernia   . Hyperlipemia   . Hypertension   . Impaired fasting glucose   . Inguinal hernia without mention of obstruction or gangrene, unilateral or unspecified, (not specified as recurrent)   . Lumbar radiculopathy   . Major depressive disorder, single episode, unspecified   . Nosebleed    most recent 05/24/13  to have  cauterized prior to surgery   .  Osteoarthritis   . Osteopenia 2002  . Skin cancer of forehead    left (BCE)  . Vertigo 1999   negative ct scan head; rare recurrance, last in 2013  . Vision disturbance     Past Surgical History:  Procedure Laterality Date  . ESOPHAGOGASTRODUODENOSCOPY N/A 07/10/2014   Procedure: ESOPHAGOGASTRODUODENOSCOPY (EGD);  Surgeon: Garlan Fair, MD;  Location: [facility];  Service: Endoscopy;  Laterality: N/A;  . EYE SURGERY     right eye cataract surgery   . GASTROSTOMY N/A 10/03/2014   Procedure: GASTROSTOMY TUBE;  Surgeon: Fanny Skates, MD;  [facility];  Service: General;  Laterality: N/A;  . HIATAL HERNIA REPAIR N/A 10/03/2014   Procedure: [place], POSSIBLE OPEN;  Surgeon: Fanny Skates, MD;  [facility];  Service: General;  Laterality: N/A;  . INGUINAL HERNIA REPAIR Right 06/07/2013   Procedure: LAPAROSCOPIC INGUINAL HERNIA REPAIR;  Surgeon: Adin Hector, MD;  [facility];  Service: General;  Laterality: Right;  With Mesh  . INSERTION OF MESH Right 06/07/2013   Procedure: INSERTION OF MESH;  Surgeon: Adin Hector, MD;  Location[facility];  Service: General;  Laterality: Right;  . TONSILLECTOMY     at age 82 yrs old    Current Outpatient Prescriptions  Medication  Sig Dispense Refill  . ALPRAZolam (XANAX) 0.25 MG tablet Take 0.25 mg by mouth 2 (two) times daily as needed for anxiety.    Marland Kitchen aspirin 81 MG tablet Take 81 mg by mouth daily.    Marland Kitchen docusate sodium (COLACE) 100 MG capsule Take 100 mg by mouth daily as needed for mild constipation.    Marland Kitchen losartan (COZAAR) 25 MG tablet Take 25 mg by mouth daily.    . metoprolol tartrate (LOPRESSOR) 25 MG tablet Take 12.5 mg by mouth 2 (two) times daily.    . mirabegron ER (MYRBETRIQ) 25 MG TB24 tablet Take 25 mg by mouth daily.    Marland Kitchen omeprazole (PRILOSEC) 20 MG capsule Take 40 mg by mouth every morning.    . polyethylene glycol (MIRALAX / GLYCOLAX) packet Take 17 g by mouth every other day.      . pravastatin (PRAVACHOL) 20 MG tablet Take 20 mg by mouth at bedtime.    . tamsulosin (FLOMAX) 0.4 MG CAPS Take 0.4 mg by mouth at bedtime.     . cephALEXin (KEFLEX) 500 MG capsule Take 4 capsules by mouth 30-60 minutes prior to dental appointment. 4 capsule 4   No current facility-administered medications for this visit.     Allergies  Allergen Reactions  . Penicillins Nausea Only    Upset stomach .     Social History   Social History  . Marital status: Widowed    Spouse name: N/A  . Number of children: 2  . Years of education: N/A   Occupational History  . 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 Smoking status: Never Smoker  . Smokeless tobacco: Never Used  . Alcohol use Yes     Comment: occasional  . Drug use: No  . Sexual activity: Not on file   Other Topics Concern  . Not on file   Social History Narrative  . No narrative on file    Family History  Problem Relation Age of Onset  . Heart disease Mother   . Heart disease Father   . Cancer Sister     breast     Review of Systems:  As stated in the HPI and otherwise negative.   BP 130/62   Pulse 69   Ht 5\' 5"  (1.651 m)   Wt 144 lb (65.3 kg)   SpO2 97%   BMI 23.96 kg/m   Physical Examination: General: Well developed, well nourished, NAD  HEENT: OP clear, mucus membranes moist  SKIN: warm, dry. No rashes. Neuro: No focal deficits  Musculoskeletal: Muscle strength 5/5 all ext  Psychiatric: Mood and affect normal  Neck: No JVD, no carotid bruits, no thyromegaly, no lymphadenopathy.  Lungs:Clear bilaterally, no wheezes, rhonci, crackles Cardiovascular: Regular rate and rhythm. Loud blowing systolic murmur. No gallops or rubs. Abdomen:Soft. Bowel sounds present. Non-tender.  Extremities: No lower extremity edema. Pulses are 2 + in the bilateral DP/PT.  Echo 08/24/16: Left ventricle: The cavity size was normal. There was moderate   concentric  hypertrophy. Systolic function was normal. The   estimated ejection fraction was in the range of 60% to 65%. Wall   motion was normal; there were no regional wall motion   abnormalities. Features are consistent with a pseudonormal left   ventricular filling pattern, with concomitant abnormal relaxation   and increased filling pressure (grade 2 diastolic dysfunction). - Aortic valve: There was trivial regurgitation. - Mitral valve: Probable, severe flail motion involving  the lateral   scallop of the posterior leaflet due to rupture of one or more   chords. There was severe regurgitation directed eccentrically and   toward the septum. Severe regurgitation is suggested by pulmonary   vein systolic flow reversal. - Left atrium: The atrium was moderately dilated. - Tricuspid valve: There was mild-moderate regurgitation directed   centrally. - Pulmonary arteries: Systolic pressure was moderately increased.   PA peak pressure: 56 mm Hg (S).  Recommendations:  Consider transesophageal echocardiography to better identify the anatomical abnormality leading to severe mitral insufficiency.  EKG:  EKG is not  ordered today. The ekg ordered today demonstrates    Recent Labs: No results found for requested labs within last 8760 hours.   Lipid Panel No results found for: CHOL, TRIG, HDL, CHOLHDL, VLDL, LDLCALC, LDLDIRECT   Wt Readings from Last 3 Encounters:  09/24/16 144 lb (65.3 kg)  09/10/16 143 lb 3.2 oz (65 kg)  09/14/15 140 lb (63.5 kg)     Other studies Reviewed: Additional studies/ records that were reviewed today include: . Review of the above records demonstrates:   Assessment and Plan:   1. 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 He is having more fatigue but minimal dyspnea or signs of CHF. LV systolic function is normal. We have discussed options for further testing including a TEE to better  assess the mitral valve and etiology of his severe MR. He is not sure that he wishes to pursue aggressive invasive measures. He tells me today that he wishes to take a conservative approach. I have spoken to his daughter in law (both sons live out of state) and his family has had long discussions about this and agrees with a conservative approach to his care.  His options for treatment would include consideration for mitral valve surgery vs mitraclip procedure if he chose to move torward. We would have to arrange a TEE and cardiac cath before those procedures could be planned.   For now, will follow. If he develops signs of CHF, will start Lasix.   He will be given antibiotic prophylaxis prior to dental procedure.   I have spent 45 minutes discussing his case, including my examination and plan. I have also spoken to his family by phone today.   Current medicines are reviewed at length with the patient today.  The patient does not have concerns regarding medicines.  The following changes have been made:  no change  Labs/ tests ordered today include:   No orders of the defined types were placed in this encounter.    Disposition:   FU with me in 3 months   Signed, Lauree Chandler, MD 09/24/2016 6:01 PM    [facility] [place], [place], [place]  [address] Phone: [phone]; Fax: [phone]

## 2016-09-24 ENCOUNTER — Ambulatory Visit (INDEPENDENT_AMBULATORY_CARE_PROVIDER_SITE_OTHER): Payer: Medicare Other | Admitting: Cardiovascular Disease

## 2016-09-24 ENCOUNTER — Encounter: Payer: Self-pay | Admitting: Cardiovascular Disease

## 2016-09-24 ENCOUNTER — Telehealth: Payer: Self-pay | Admitting: Cardiovascular Disease

## 2016-09-24 VITALS — BP 130/62 | HR 69 | Ht 65.0 in | Wt 144.0 lb

## 2016-09-24 DIAGNOSIS — I340 Nonrheumatic mitral (valve) insufficiency: Secondary | ICD-10-CM | POA: Diagnosis not present

## 2016-09-24 MED ORDER — CEPHALEXIN 500 MG PO CAPS: ORAL_CAPSULE | ORAL | 4 refills | Status: DC

## 2016-09-24 NOTE — Patient Instructions (Signed)
Medication Instructions:  Your physician recommends that you continue on your current medications as directed. Please refer to the Current Medication list given to you today.  A prescription for cephalexin has been sent to your pharmacy for you to take prior to your dental appointment.    Labwork: none  Testing/Procedures: none  Follow-Up: Your physician recommends that you schedule a follow-up appointment in: 3 months.     Any Other Special Instructions Will Be Listed Below (If Applicable).     If you need a refill on your cardiac medications before your next appointment, please call your pharmacy.

## 2016-09-24 NOTE — Telephone Encounter (Signed)
Spoke with pt's son and told him Dr. Angelena Form was not in office this afternoon.  I told him Dr. Angelena Form would try and call him back early tomorrow afternoon.

## 2016-09-24 NOTE — Telephone Encounter (Signed)
New message   Pt's son called/re: results  Please call back

## 2016-09-25 NOTE — Telephone Encounter (Signed)
I spoke to his son this am. cdm

## 2016-09-30 DIAGNOSIS — R351 Nocturia: Secondary | ICD-10-CM | POA: Diagnosis not present

## 2016-10-05 ENCOUNTER — Ambulatory Visit: Payer: Medicare Other | Admitting: Cardiology

## 2016-10-08 ENCOUNTER — Encounter: Payer: Self-pay | Admitting: Podiatry

## 2016-10-08 ENCOUNTER — Ambulatory Visit (INDEPENDENT_AMBULATORY_CARE_PROVIDER_SITE_OTHER): Payer: Medicare Other | Admitting: Podiatry

## 2016-10-08 VITALS — Ht 65.0 in | Wt 144.0 lb

## 2016-10-08 DIAGNOSIS — M79609 Pain in unspecified limb: Secondary | ICD-10-CM | POA: Diagnosis not present

## 2016-10-08 DIAGNOSIS — B351 Tinea unguium: Secondary | ICD-10-CM | POA: Diagnosis not present

## 2016-10-08 DIAGNOSIS — M79676 Pain in unspecified toe(s): Secondary | ICD-10-CM

## 2016-10-08 NOTE — Progress Notes (Signed)
   Subjective:    Patient ID: Larry Mcfarland, male    DOB: [birth year], 82 y.o.   MRN: [id number]  HPI this patient presents to the office with chief complaint of long thick painful nails. He states his nails have grown thick and long, but especially the big toe nail on the left foot is painful. He states the nails are painful walking and wearing his shoes. He presents the office for preventive foot care services.      Review of Systems  All other systems reviewed and are negative.      Objective:   Physical Exam GENERAL APPEARANCE: Alert, conversant. Appropriately groomed. No acute distress.  VASCULAR: Pedal pulses palpable at  [facility] and PT bilateral.  Capillary refill time is immediate to all digits,  Normal temperature gradient.  Digital hair growth is present bilateral  NEUROLOGIC: sensation is normal to 5.07 monofilament at 5/5 sites bilateral.  Light touch is intact bilateral, Muscle strength normal.  MUSCULOSKELETAL: acceptable muscle strength, tone and stability bilateral.  Intrinsic muscluature intact bilateral. Plantarflexed second third metatarsals  B/L with hammer toes B/L.  DERMATOLOGIC: skin color, texture, and turgor are within normal limits.  No preulcerative lesions or ulcers  are seen, no interdigital maceration noted.  No open lesions present.  . No drainage noted. NAILS  Thick disfigured discolored nails both feet.        Assessment & Plan:  Onychomycosis B/L  IE  Debride Nails  RTC 3 months.    Gardiner Barefoot DPM

## 2016-10-16 IMAGING — MR MR LUMBAR SPINE W/O CM
4 of 5 series · 25 of 48 positions shown · non-contrast
Comparison: One view abdomen 10/04/2014.  Lumbar MRI 08/19/2008.

CLINICAL DATA: Right-sided low back pain for 6-12 months. No
radicular symptoms, acute injury or prior relevant surgery. Initial
encounter.

EXAM:
MRI LUMBAR SPINE WITHOUT CONTRAST
TECHNIQUE: Multiplanar, multisequence MR imaging of the lumbar spine was
performed. No intravenous contrast was administered.

[Series 3: T2 · sagittal · 4.0mm · 0.55mm/px · 8 of 19 slices shown (1 of 2)]
[im 1/19]
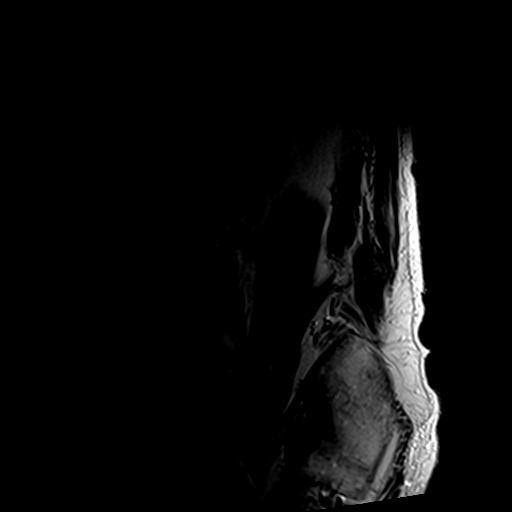
[im 3/19]
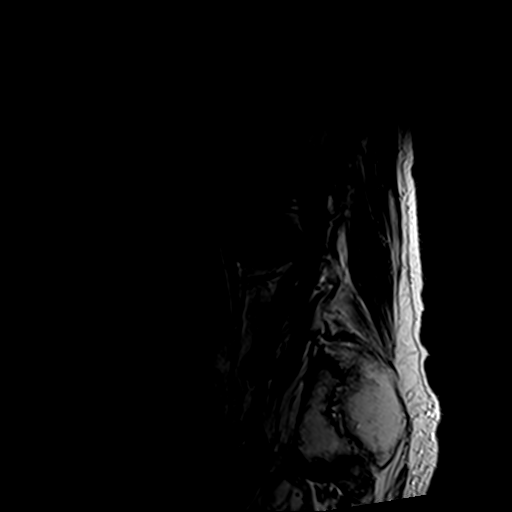
[im 6/19]
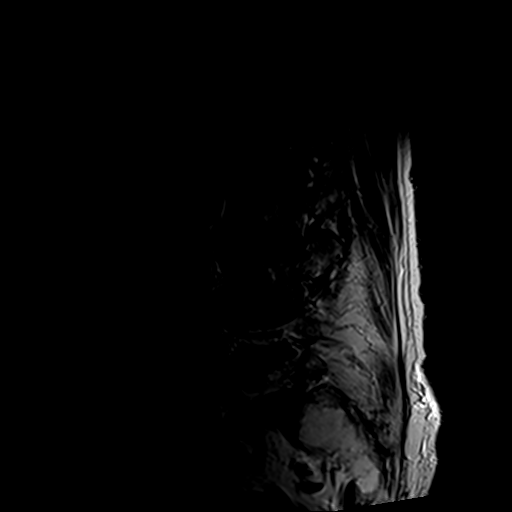
[im 8/19]
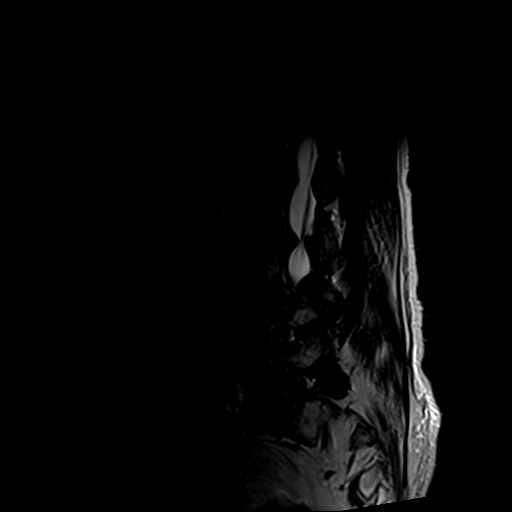
[im 11/19]
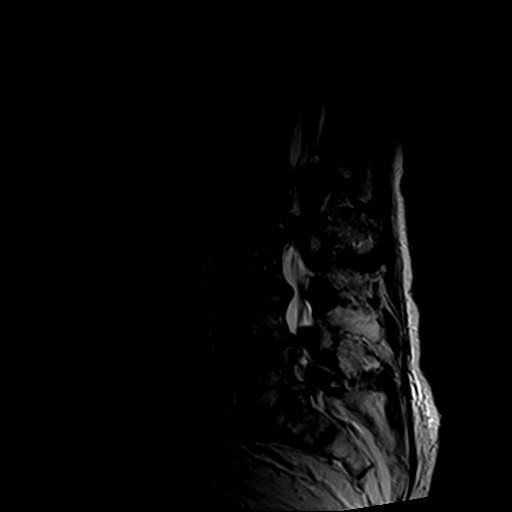
[im 13/19]
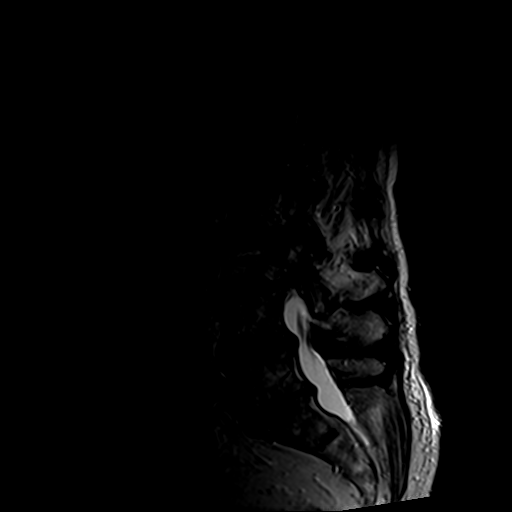
[im 16/19]
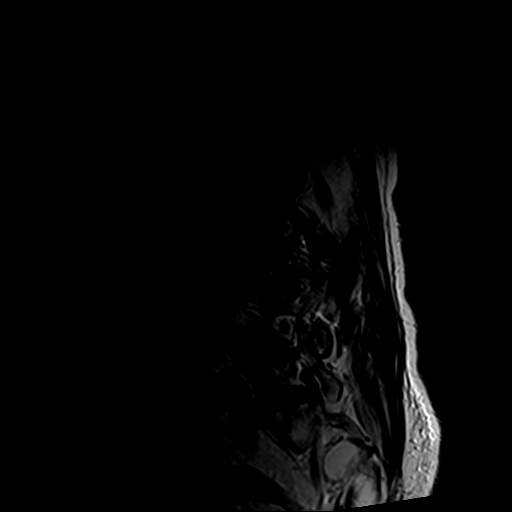
[im 19/19]
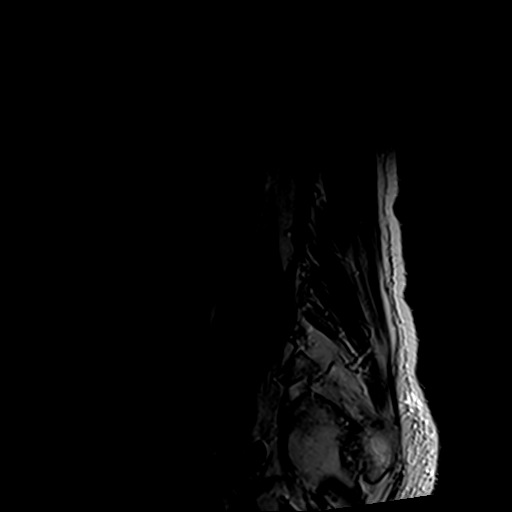

[Series 5: T1 · sagittal · 4.0mm · 0.55mm/px · 5 of 19 slices shown (1 of 2)]
[im 1/19]
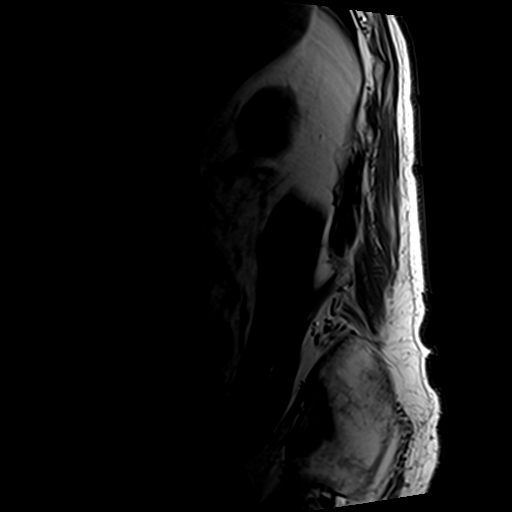
[im 3/19]
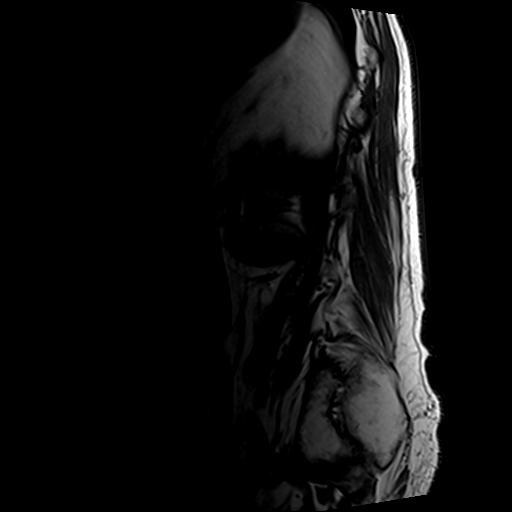
[im 6/19]
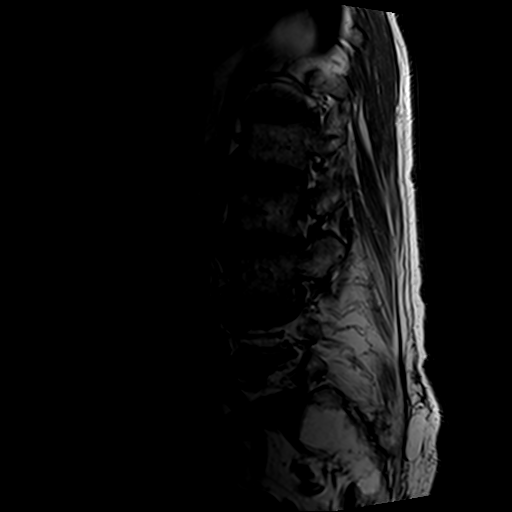
[im 11/19]
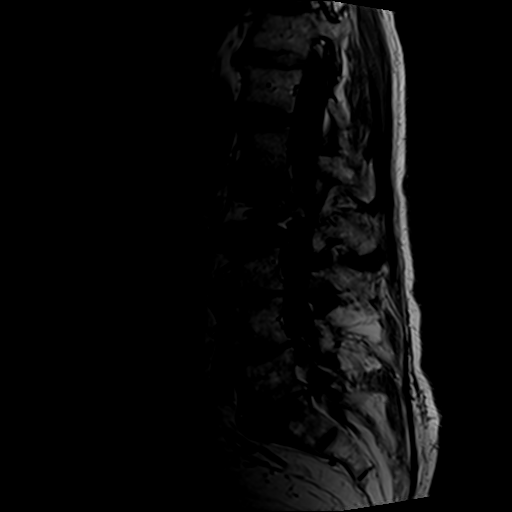
[im 16/19]
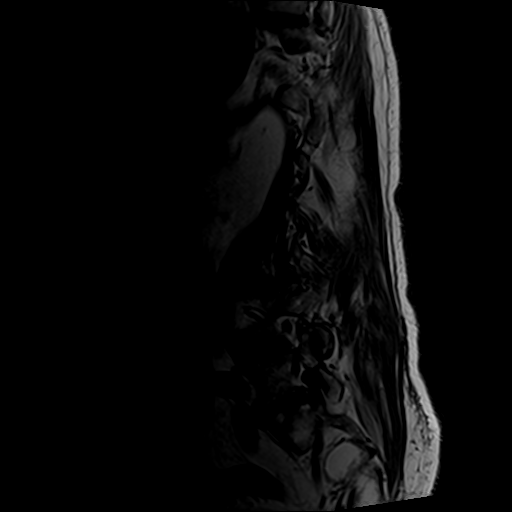

[Series 6: T2 · axial · 4.0mm · 0.70mm/px · z∈[-44,+123]mm · 9 of 30 slices shown (2 of 2)]
[im 1/30]
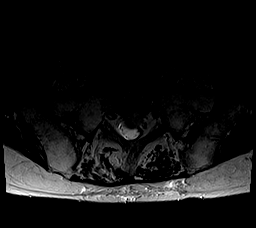
[im 6/30]
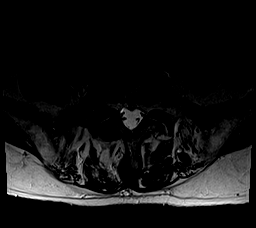
[im 8/30]
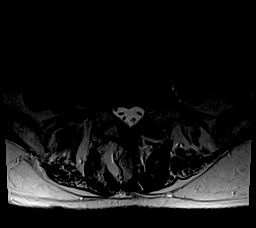
[im 14/30]
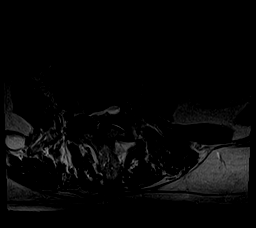
[im 16/30]
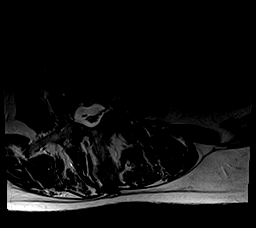
[im 22/30]
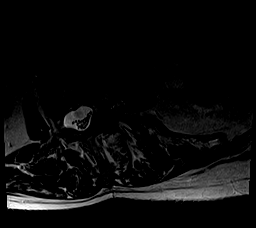
[im 24/30]
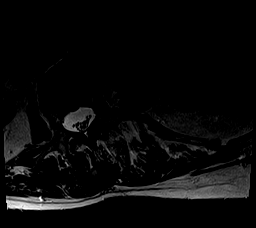
[im 27/30]
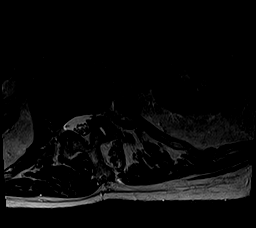
[im 30/30]
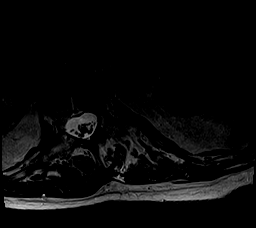

[Series 7: T1 · axial · 4.0mm · 0.35mm/px · z∈[-18,+108]mm · 3 of 30 slices shown (2 of 2)]
[im 6/30]
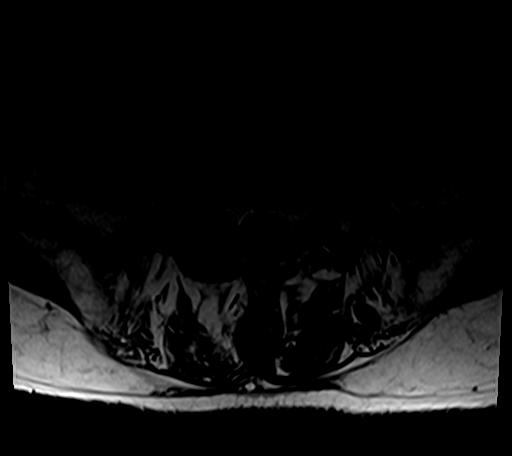
[im 16/30]
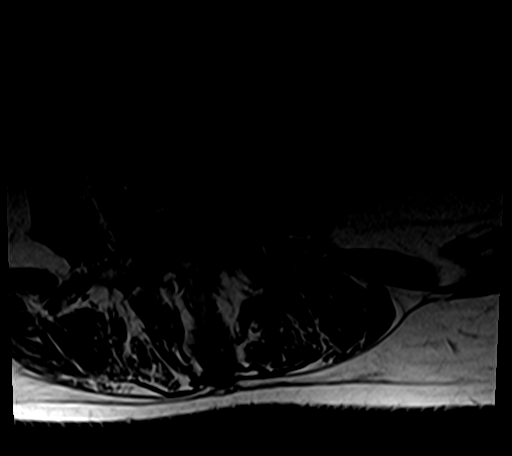
[im 27/30]
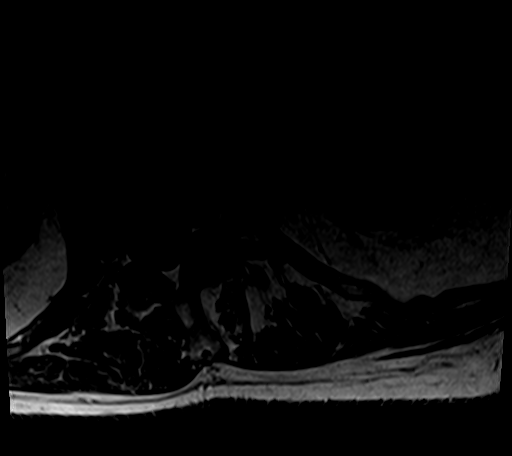

[25 of 48 positions shown; findings below may reference images not displayed]

FINDINGS: Five lumbar type vertebral bodies are assumed. There is a moderate
convex right scoliosis centered at L2, measuring approximately 32
degrees. Previously demonstrated marrow edema at L4 and L5 has
resolved. No evidence of acute fracture or pars defect. The lateral
alignment is normal.

The conus medullaris extends to the L1-2 level and appears normal.
No paraspinal abnormalities are identified.

No significant disc space findings from T11-12 through L1-2.

L2-3: Progressive disc degeneration with loss of height, eccentric
disc bulging on the left and left lateral paraspinal osteophytes.
There is progressive facet and ligamentous hypertrophy, also
asymmetric to the left. There is progressive spinal stenosis, now
moderate. There is also moderate narrowing of the left foramen and
left greater than right lateral recess narrowing.

L3-4: Minimally progressive chronic spondylosis with asymmetric disc
bulging and paraspinal osteophytes on the left. There is moderate
facet and ligamentous hypertrophy contributing to mildly progressive
spinal stenosis, now moderate. There is moderate narrowing of the
lateral recesses and left foramen.

L4-5: Chronic loss of disc height with asymmetric disc bulging and
osteophytes on the right. Advanced asymmetric right-sided facet and
ligamentous hypertrophy is mildly progressive. There is severe
spinal stenosis with mildly progressive narrowing of the right
lateral recess and right foramen.

L5-S1: Chronic spondylosis with loss of disc height and paraspinal
osteophytes asymmetric to the right. Relatively mild and symmetric
facet hypertrophy bilaterally. Chronic moderate right foraminal
narrowing appears unchanged.
IMPRESSION: 1. Compared with the prior study from 5801, there is progressive
multilevel spondylosis associated with a convex right scoliosis.
2. At both L2-3 and L3-4, there is asymmetric disc bulging and
osteophytes on the left contributing to progressive spinal stenosis
at both levels. There is also progressive narrowing of the lateral
recesses and foramina, asymmetric to the left.
3. At L4-5, there is progressive asymmetric disc bulging and
osteophytes on the right contributing to severe spinal stenosis with
progressive narrowing of the right lateral recess and right foramen.
4. No significant change in chronic moderate right foraminal
narrowing at L5-S1.

## 2016-12-02 DIAGNOSIS — Z1389 Encounter for screening for other disorder: Secondary | ICD-10-CM | POA: Diagnosis not present

## 2016-12-02 DIAGNOSIS — Z0000 Encounter for general adult medical examination without abnormal findings: Secondary | ICD-10-CM | POA: Diagnosis not present

## 2016-12-02 DIAGNOSIS — I2510 Atherosclerotic heart disease of native coronary artery without angina pectoris: Secondary | ICD-10-CM | POA: Diagnosis not present

## 2016-12-02 DIAGNOSIS — F419 Anxiety disorder, unspecified: Secondary | ICD-10-CM | POA: Diagnosis not present

## 2016-12-02 DIAGNOSIS — N183 Chronic kidney disease, stage 3 (moderate): Secondary | ICD-10-CM | POA: Diagnosis not present

## 2016-12-02 DIAGNOSIS — I10 Essential (primary) hypertension: Secondary | ICD-10-CM | POA: Diagnosis not present

## 2016-12-02 DIAGNOSIS — I700 Atherosclerosis of aorta: Secondary | ICD-10-CM | POA: Diagnosis not present

## 2016-12-02 DIAGNOSIS — R1310 Dysphagia, unspecified: Secondary | ICD-10-CM | POA: Diagnosis not present

## 2016-12-02 DIAGNOSIS — I340 Nonrheumatic mitral (valve) insufficiency: Secondary | ICD-10-CM | POA: Diagnosis not present

## 2016-12-02 DIAGNOSIS — E7800 Pure hypercholesterolemia, unspecified: Secondary | ICD-10-CM | POA: Diagnosis not present

## 2016-12-24 ENCOUNTER — Ambulatory Visit (INDEPENDENT_AMBULATORY_CARE_PROVIDER_SITE_OTHER): Payer: Medicare Other | Admitting: Cardiovascular Disease

## 2016-12-24 ENCOUNTER — Encounter: Payer: Self-pay | Admitting: Cardiovascular Disease

## 2016-12-24 VITALS — BP 124/68 | HR 58 | Ht 65.0 in | Wt 142.8 lb

## 2016-12-24 DIAGNOSIS — I340 Nonrheumatic mitral (valve) insufficiency: Secondary | ICD-10-CM

## 2016-12-24 NOTE — Progress Notes (Signed)
r  Chief Complaint  Patient presents with  . Fatigue     History of Present Illness: 83 yo male with history of chronic kidney disease, paroxysmal atrial fibrillation, GERD, HTN, HLD and severe mitral regurgiation who is here today for follow up. He had been followed by Dr. Johnsie Cancel in our office. I saw him  in the valve clinic as a new patient in October 2016 to discuss his severe mitral regurgitation. 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 After a long discussion with the patient and his family, we elected for conservative management of his valve disease. He has remote atrial fibrillation but no recent recurrences. He has normal LV systolic function.    He tells me today that he continues to have fatigue but no dyspnea. He has no chest pains or palpitations.  He denies ankle edema.    Primary Care Physician: Kandice Hams, MD   Past Medical History:  Diagnosis Date  . Anginal pain ([place])   . Anxiety   . Bleeding from the nose 2009   cautery by Dr. Erik Obey  . BPH (benign prostatic hyperplasia)   . CAD (coronary artery disease)   . Cataract    right eye  . CKD (chronic kidney disease), stage III   . Distal radius fracture 2013   Dr. Fredna Dow  . Dysrhythmia    hx of atrial fib- 10 years ago   . GERD (gastroesophageal reflux disease)   . Gout   . H/O hiatal hernia   . Hyperlipemia   . Hypertension   . Impaired fasting glucose   . Inguinal hernia without mention of obstruction or gangrene, unilateral or unspecified, (not specified as recurrent)   . Lumbar radiculopathy   . Major depressive disorder, single episode, unspecified   . Nosebleed    most recent 05/24/13  to have  cauterized prior to surgery   . Osteoarthritis   . Osteopenia 2002  . Skin cancer of forehead    left (BCE)  . Vertigo 1999   negative ct scan head; rare recurrance, last in 2013  . Vision disturbance     Past Surgical History:  Procedure Laterality Date   . ESOPHAGOGASTRODUODENOSCOPY N/A 07/10/2014   Procedure: ESOPHAGOGASTRODUODENOSCOPY (EGD);  Surgeon: Garlan Fair, MD;  Location: [facility];  Service: Endoscopy;  Laterality: N/A;  . EYE SURGERY     right eye cataract surgery   . GASTROSTOMY N/A 10/03/2014   Procedure: GASTROSTOMY TUBE;  Surgeon: Fanny Skates, MD;  Location: [facility];  Service: General;  Laterality: N/A;  . HIATAL HERNIA REPAIR N/A 10/03/2014   Procedure: [place], POSSIBLE OPEN;  Surgeon: Fanny Skates, MD;  Location: [facility];  Service: General;  Laterality: N/A;  . INGUINAL HERNIA REPAIR Right 06/07/2013   Procedure: LAPAROSCOPIC INGUINAL HERNIA REPAIR;  Surgeon: Adin Hector, MD;  Location: [facility];  Service: General;  Laterality: Right;  With Mesh  . INSERTION OF MESH Right 06/07/2013   Procedure: INSERTION OF MESH;  Surgeon: Adin Hector, MD;  Location: [facility];  Service: General;  Laterality: Right;  . TONSILLECTOMY     at age 72 yrs old    Current Outpatient Prescriptions  Medication Sig Dispense Refill  . ALPRAZolam (XANAX) 0.25 MG tablet Take 0.25 mg by mouth 2 (two) times daily as needed for anxiety.    Marland Kitchen aspirin 81 MG tablet Take 81 mg by mouth daily.    . cephALEXin (KEFLEX) 500 MG  capsule Take 4 capsules by mouth 30-60 minutes prior to dental appointment. 4 capsule 4  . losartan (COZAAR) 25 MG tablet Take 25 mg by mouth daily.    . metoprolol tartrate (LOPRESSOR) 25 MG tablet Take 12.5 mg by mouth 2 (two) times daily.    . mirabegron ER (MYRBETRIQ) 25 MG TB24 tablet Take 25 mg by mouth daily.    Marland Kitchen omeprazole (PRILOSEC) 20 MG capsule Take 40 mg by mouth every morning.    . polyethylene glycol (MIRALAX / GLYCOLAX) packet Take 17 g by mouth every other day.    . pravastatin (PRAVACHOL) 20 MG tablet Take 20 mg by mouth at bedtime.     No current facility-administered medications for this visit.     Allergies  Allergen Reactions  . Penicillins Nausea Only    Upset  stomach .     Social History   Social History  . Marital status: Widowed    Spouse name: N/A  . Number of children: 2  . Years of education: N/A   Occupational History  . 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 Smoking status: Never Smoker  . Smokeless tobacco: Never Used  . Alcohol use Yes     Comment: occasional  . Drug use: No  . Sexual activity: Not on file   Other Topics Concern  . Not on file   Social History Narrative  . No narrative on file    Family History  Problem Relation Age of Onset  . Heart disease Mother   . Heart disease Father   . Cancer Sister     breast     Review of Systems:  As stated in the HPI and otherwise negative.   BP 124/68   Pulse (!) 58   Ht 5\' 5"  (1.651 m)   Wt 142 lb 12.8 oz (64.8 kg)   BMI 23.76 kg/m   Physical Examination: General: Well developed, well nourished, NAD  HEENT: OP clear, mucus membranes moist  SKIN: warm, dry. No rashes. Neuro: No focal deficits  Musculoskeletal: Muscle strength 5/5 all ext  Psychiatric: Mood and affect normal  Neck: No JVD, no carotid bruits, no thyromegaly, no lymphadenopathy.  Lungs:Clear bilaterally, no wheezes, rhonci, crackles Cardiovascular: Regular rate and rhythm. Loud blowing systolic murmur. No gallops or rubs. Abdomen:Soft. Bowel sounds present. Non-tender.  Extremities: No lower extremity edema. Pulses are 2 + in the bilateral DP/PT.  Echo 08/24/16: Left ventricle: The cavity size was normal. There was moderate   concentric hypertrophy. Systolic function was normal. The   estimated ejection fraction was in the range of 60% to 65%. Wall   motion was normal; there were no regional wall motion   abnormalities. Features are consistent with a pseudonormal left   ventricular filling pattern, with concomitant abnormal relaxation   and increased filling pressure (grade 2 diastolic dysfunction). - Aortic valve: There was trivial  regurgitation. - Mitral valve: Probable, severe flail motion involving the lateral   scallop of the posterior leaflet due to rupture of one or more   chords. There was severe regurgitation directed eccentrically and   toward the septum. Severe regurgitation is suggested by pulmonary   vein systolic flow reversal. - Left atrium: The atrium was moderately dilated. - Tricuspid valve: There was mild-moderate regurgitation directed   centrally. - Pulmonary arteries: Systolic pressure was moderately increased.   PA peak pressure: 56 mm Hg (S).  Recommendations:  Consider transesophageal echocardiography  to better identify the anatomical abnormality leading to severe mitral insufficiency.  EKG:  EKG is not ordered today. The ekg ordered today demonstrates    Recent Labs: No results found for requested labs within last 8760 hours.   Lipid Panel No results found for: CHOL, TRIG, HDL, CHOLHDL, VLDL, LDLCALC, LDLDIRECT   Wt Readings from Last 3 Encounters:  12/24/16 142 lb 12.8 oz (64.8 kg)  10/08/16 144 lb (65.3 kg)  09/24/16 144 lb (65.3 kg)     Other studies Reviewed: Additional studies/ records that were reviewed today include: . Review of the above records demonstrates:   Assessment and Plan:   1. 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 He is having no dyspnea or LE edema. Some fatigue, unchanged. LV systolic function is normal. We have elected for conservative management given his age. For now, will follow. If he develops signs of CHF, will start Lasix. He will be given antibiotic prophylaxis prior to dental procedure.   Current medicines are reviewed at length with the patient today.  The patient does not have concerns regarding medicines.  The following changes have been made:  no change  Labs/ tests ordered today include:   No orders of the defined types were placed in this  encounter.    Disposition:   FU with me in 6 months   Signed, Lauree Chandler, MD 12/24/2016 9:59 AM    [facility] [place], [place], [place]  [address] Phone: [phone]; Fax: [phone]

## 2016-12-24 NOTE — Patient Instructions (Signed)

## 2016-12-28 ENCOUNTER — Encounter (HOSPITAL_COMMUNITY): Payer: Self-pay

## 2016-12-28 ENCOUNTER — Emergency Department (HOSPITAL_COMMUNITY)
Admission: EM | Admit: 2016-12-28 | Discharge: 2016-12-29 | Disposition: A | Discharge status: Home / Self Care | Payer: Medicare Other | Attending: Emergency Medicine | Admitting: Emergency Medicine

## 2016-12-28 DIAGNOSIS — Z7982 Long term (current) use of aspirin: Secondary | ICD-10-CM | POA: Diagnosis not present

## 2016-12-28 DIAGNOSIS — R000 Tachycardia, unspecified: Secondary | ICD-10-CM | POA: Diagnosis not present

## 2016-12-28 DIAGNOSIS — Z85828 Personal history of other malignant neoplasm of skin: Secondary | ICD-10-CM | POA: Insufficient documentation

## 2016-12-28 DIAGNOSIS — M79602 Pain in left arm: Secondary | ICD-10-CM

## 2016-12-28 DIAGNOSIS — N183 Chronic kidney disease, stage 3 (moderate): Secondary | ICD-10-CM | POA: Insufficient documentation

## 2016-12-28 DIAGNOSIS — Z79899 Other long term (current) drug therapy: Secondary | ICD-10-CM | POA: Insufficient documentation

## 2016-12-28 DIAGNOSIS — I2510 Atherosclerotic heart disease of native coronary artery without angina pectoris: Secondary | ICD-10-CM | POA: Diagnosis not present

## 2016-12-28 DIAGNOSIS — I129 Hypertensive chronic kidney disease with stage 1 through stage 4 chronic kidney disease, or unspecified chronic kidney disease: Secondary | ICD-10-CM | POA: Insufficient documentation

## 2016-12-28 LAB — CBC
HCT: 34.3 % — ABNORMAL LOW (ref 39.0–52.0)
Hemoglobin: 11.1 g/dL — ABNORMAL LOW (ref 13.0–17.0)
MCH: 31.3 pg (ref 26.0–34.0)
MCHC: 32.4 g/dL (ref 30.0–36.0)
MCV: 96.6 fL (ref 78.0–100.0)
Platelets: 161 10*3/uL (ref 150–400)
RBC: 3.55 MIL/uL — ABNORMAL LOW (ref 4.22–5.81)
RDW: 13.5 % (ref 11.5–15.5)
WBC: 5.9 10*3/uL (ref 4.0–10.5)

## 2016-12-28 LAB — BASIC METABOLIC PANEL
Anion gap: 8 (ref 5–15)
BUN: 26 mg/dL — ABNORMAL HIGH (ref 6–20)
CO2: 22 mmol/L (ref 22–32)
Calcium: 9 mg/dL (ref 8.9–10.3)
Chloride: 107 mmol/L (ref 101–111)
Creatinine, Ser: 1.86 mg/dL — ABNORMAL HIGH (ref 0.61–1.24)
GFR calc Af Amer: 34 mL/min — ABNORMAL LOW (ref >60)
GFR calc non Af Amer: 30 mL/min — ABNORMAL LOW (ref >60)
Glucose, Bld: 125 mg/dL — ABNORMAL HIGH (ref 65–99)
Potassium: 4.3 mmol/L (ref 3.5–5.1)
Sodium: 137 mmol/L (ref 135–145)

## 2016-12-28 LAB — I-STAT TROPONIN, ED: Troponin i, poc: 0 ng/mL (ref 0.00–0.08)

## 2016-12-28 NOTE — ED Triage Notes (Signed)
Pt arrives via [facility] from [facility] living with complaints of LEFT arm pain since 1400 today. Pt denies having n/v/sob/diaphoresis/ chest pain. Pt was seen by cardiology Monday. NAD VSS

## 2016-12-28 NOTE — ED Provider Notes (Signed)
[facility] DEPT Provider Note   CSN: [id number] Arrival date & time: 12/28/16  2139   History   Chief Complaint Chief Complaint  Patient presents with  . Arm Pain    HPI Larry Mcfarland is a 83 y.o. male.  83 year old male with history of CKD, PAF, GERD, HTN, HLD and severe mitral regurgiation secondary to flail leaflet with probable chord rupture. He presents to the ED today for evaluation of left arm pain. He states that he noticed a discomfort which originated around his elbow and traveled down the volar aspect of his left forearm. He denies any associated paresthesias and has difficulty describing the pain. Pain began at 1630 (triage reports onset at 1400) and has been intermittent. He denies any discomfort at this time. No known modifying factors. He expresses most concerned that his pain may be secondary to a heart issue. He denies any associated chest pain, diaphoresis, shortness breath, nausea, vomiting, leg swelling, trauma or injury. No associated neck pain or extremity numbness. He was seen by his PCP yesterday and his cardiologist 1 week ago, both with reassuring evaluations.      Past Medical History:  Diagnosis Date  . Anginal pain ([place])   . Anxiety   . Bleeding from the nose 2009   cautery by Dr. Erik Obey  . BPH (benign prostatic hyperplasia)   . CAD (coronary artery disease)   . Cataract    right eye  . CKD (chronic kidney disease), stage III   . Distal radius fracture 2013   Dr. Fredna Dow  . Dysrhythmia    hx of atrial fib- 10 years ago   . GERD (gastroesophageal reflux disease)   . Gout   . H/O hiatal hernia   . Hyperlipemia   . Hypertension   . Impaired fasting glucose   . Inguinal hernia without mention of obstruction or gangrene, unilateral or unspecified, (not specified as recurrent)   . Lumbar radiculopathy   . Major depressive disorder, single episode, unspecified   . Nosebleed    most recent 05/24/13  to have  cauterized prior to surgery   .  Osteoarthritis   . Osteopenia 2002  . Skin cancer of forehead    left (BCE)  . Vertigo 1999   negative ct scan head; rare recurrance, last in 2013  . Vision disturbance     Patient Active Problem List   Diagnosis Date Noted  . BPH without urinary obstruction 10/16/2014  . Hyperlipidemia 10/16/2014  . Arrhythmia 10/08/2014  . Gastrojejunostomy tube status ([place]) 10/05/2014  . Preop cardiovascular exam 09/25/2014  . HTN (hypertension) 09/25/2014  . PAF (paroxysmal atrial fibrillation) ([place]) 09/25/2014  . Hiatal hernia s/p lap Repair with G-tube Nov 2015 07/30/2014  . Right inguinal hernia 01/10/2013    Past Surgical History:  Procedure Laterality Date  . ESOPHAGOGASTRODUODENOSCOPY N/A 07/10/2014   Procedure: ESOPHAGOGASTRODUODENOSCOPY (EGD);  Surgeon: Garlan Fair, MD;  Location: [facility];  Service: Endoscopy;  Laterality: N/A;  . EYE SURGERY     right eye cataract surgery   . GASTROSTOMY N/A 10/03/2014   Procedure: GASTROSTOMY TUBE;  Surgeon: Fanny Skates, MD;  Location: [facility];  Service: General;  Laterality: N/A;  . HIATAL HERNIA REPAIR N/A 10/03/2014   Procedure: LAPAROSCOPIC REPAIR GIANT HIATAL HERNIA, POSSIBLE OPEN;  Surgeon: Fanny Skates, MD;  Location: [facility];  Service: General;  Laterality: N/A;  . INGUINAL HERNIA REPAIR Right 06/07/2013   Procedure: LAPAROSCOPIC INGUINAL HERNIA REPAIR;  Surgeon: Adin Hector, MD;  Location: [facility];  Service: General;  Laterality: Right;  With Mesh  . INSERTION OF MESH Right 06/07/2013   Procedure: INSERTION OF MESH;  Surgeon: Adin Hector, MD;  Location: [facility];  Service: General;  Laterality: Right;  . TONSILLECTOMY     at age 39 yrs old       Home Medications    Prior to Admission medications   Medication Sig Start Date End Date Taking? Authorizing Provider  ALPRAZolam (XANAX) 0.25 MG tablet Take 0.25 mg by mouth 2 (two) times daily as needed for anxiety.    Historical Provider, MD  aspirin 81 MG tablet Take 81 mg  by mouth daily.    Historical Provider, MD  cephALEXin (KEFLEX) 500 MG capsule Take 4 capsules by mouth 30-60 minutes prior to dental appointment. 09/24/16   Burnell Blanks, MD  losartan (COZAAR) 25 MG tablet Take 25 mg by mouth daily.    Historical Provider, MD  metoprolol tartrate (LOPRESSOR) 25 MG tablet Take 12.5 mg by mouth 2 (two) times daily.    Historical Provider, MD  mirabegron ER (MYRBETRIQ) 25 MG TB24 tablet Take 25 mg by mouth daily.    Historical Provider, MD  omeprazole (PRILOSEC) 20 MG capsule Take 40 mg by mouth every morning.    Historical Provider, MD  polyethylene glycol (MIRALAX / GLYCOLAX) packet Take 17 g by mouth every other day.    Historical Provider, MD  pravastatin (PRAVACHOL) 20 MG tablet Take 20 mg by mouth at bedtime.    Historical Provider, MD    Family History Family History  Problem Relation Age of Onset  . Heart disease Mother   . Heart disease Father   . Cancer Sister     breast     Social History Social History  Substance Use Topics  . Smoking status: Never Smoker  . Smokeless tobacco: Never Used  . Alcohol use Yes     Comment: occasional     Allergies   Penicillins   Review of Systems Review of Systems Ten systems reviewed and are negative for acute change, except as noted in the HPI.    Physical Exam Updated Vital Signs BP 141/80   Pulse 60   Temp 97.7 F (36.5 C) (Oral)   Resp (!) 29   Ht 5\' 5"  (1.651 m)   Wt 61.2 kg   SpO2 98%   BMI 22.47 kg/m   Physical Exam  Constitutional: He is oriented to person, place, and time. He appears well-developed and well-nourished. No distress.  Nontoxic appearing; pleasant.  HENT:  Head: Normocephalic and atraumatic.  Eyes: Conjunctivae and EOM are normal. No scleral icterus.  Neck: Normal range of motion.  No JVD  Cardiovascular: Normal rate, regular rhythm and intact distal pulses.   Murmur (holosystolic murmur) heard. Distal radial pulse 2+ in the LUE. Capillary refill  brisk in all digits of left hand.  Pulmonary/Chest: Effort normal. No respiratory distress. He has no wheezes. He has no rales.  Respirations even and unlabored. Lungs are grossly clear bilaterally.  Musculoskeletal: Normal range of motion.  No pitting edema in bilateral lower extremities.  Neurological: He is alert and oriented to person, place, and time. He exhibits normal muscle tone. Coordination normal.  GCS 15. Ambulatory with steady gait and minimal assistance.  Skin: Skin is warm and dry. No rash noted. He is not diaphoretic. No erythema. No pallor.  Psychiatric: He has a normal mood and affect. His behavior is normal.  Nursing note and  vitals reviewed.    ED Treatments / Results  Labs (all labs ordered are listed, but only abnormal results are displayed) Labs Reviewed  CBC - Abnormal; Notable for the following:       Result Value   RBC 3.55 (*)    Hemoglobin 11.1 (*)    HCT 34.3 (*)    All other components within normal limits  BASIC METABOLIC PANEL - Abnormal; Notable for the following:    Glucose, Bld 125 (*)    BUN 26 (*)    Creatinine, Ser 1.86 (*)    GFR calc non Af Amer 30 (*)    GFR calc Af Amer 34 (*)    All other components within normal limits  I-STAT TROPOININ, ED    EKG  EKG Interpretation  Date/Time:  Monday December 28 2016 21:45:30 EST Ventricular Rate:  63 PR Interval:    QRS Duration: 95 QT Interval:  416 QTC Calculation: 426 R Axis:   43 Text Interpretation:  Sinus rhythm Atrial premature complex No significant change since last tracing Confirmed by [facility] MD, Junie Panning ([id number]) on 12/28/2016 11:03:55 PM       Radiology No results found.  Procedures Procedures (including critical care time)  Echo 08/24/16: Left ventricle: The cavity size was normal. There was moderate concentric hypertrophy. Systolic function was normal. The estimated ejection fraction was in the range of 60% to 65%. Wall motion was normal; there were no regional  wall motion abnormalities. Features are consistent with a pseudonormal left ventricular filling pattern, with concomitant abnormal relaxation and increased filling pressure (grade 2 diastolic dysfunction). - Aortic valve: There was trivial regurgitation. - Mitral valve: Probable, severe flail motion involving the lateral scallop of the posterior leaflet due to rupture of one or more chords. There was severe regurgitation directed eccentrically and toward the septum. Severe regurgitation is suggested by pulmonary vein systolic flow reversal. - Left atrium: The atrium was moderately dilated. - Tricuspid valve: There was mild-moderate regurgitation directed centrally. - Pulmonary arteries: Systolic pressure was moderately increased. PA peak pressure: 56 mm Hg(S).  Medications Ordered in ED Medications - No data to display   Initial Impression / Assessment and Plan / ED Course  I have reviewed the triage vital signs and the nursing notes.  Pertinent labs & imaging results that were available during my care of the patient were reviewed by me and considered in my medical decision making (see chart for details).     83 year old male presents to the emergency department for evaluation of intermittent, nonspecific discomfort to the volar aspect of his left arm. No modifying factors of symptoms and he denies any associated chest pain, shortness of breath, syncope, near syncope, diaphoresis, nausea, vomiting, or leg swelling. The patient expresses concern over her symptoms being related to his heart. Symptoms are atypical for cardiac etiology and workup today is reassuring. He has been asymptomatic while in the emergency department. Vitals stable. I do not believe further emergent workup is indicated. The patient will be discharged with instruction of follow-up with his primary care doctor. Return precautions discussed and provided. Patient discharged in stable condition with no  unaddressed concerns. Will transport back to his facility via [place].   Final Clinical Impressions(s) / ED Diagnoses   Final diagnoses:  Left arm pain    New Prescriptions New Prescriptions   No medications on file     Antonietta Breach, PA-C 12/29/16 0004    Gareth Morgan, MD 12/30/16 [phone]

## 2016-12-28 NOTE — Discharge Instructions (Signed)
Your blood tests today were reassuring. We do not believe that your symptoms are due to problems with your heart. We advise the use of Tylenol for any persistent discomfort. Follow-up with your primary care doctor regarding your visit today.

## 2017-01-07 ENCOUNTER — Ambulatory Visit (INDEPENDENT_AMBULATORY_CARE_PROVIDER_SITE_OTHER): Payer: Medicare Other | Admitting: Podiatry

## 2017-01-07 ENCOUNTER — Encounter: Payer: Self-pay | Admitting: Podiatry

## 2017-01-07 VITALS — Ht 65.0 in | Wt 142.0 lb

## 2017-01-07 DIAGNOSIS — M79609 Pain in unspecified limb: Secondary | ICD-10-CM

## 2017-01-07 DIAGNOSIS — B351 Tinea unguium: Secondary | ICD-10-CM | POA: Diagnosis not present

## 2017-01-07 NOTE — Progress Notes (Signed)
   Subjective:    Patient ID: Larry Mcfarland, male    DOB: 1921-06-13, 82 y.o.   MRN: [id number]  HPI this patient presents to the office with chief complaint of long thick painful nails. He states his nails have grown thick and long, but especially the big toe nail on the left foot is painful. He states the nails are painful walking and wearing his shoes. He presents the office for preventive foot care services.      Review of Systems  All other systems reviewed and are negative.      Objective:   Physical Exam GENERAL APPEARANCE: Alert, conversant. Appropriately groomed. No acute distress.  VASCULAR: Pedal pulses palpable at  [facility] and PT bilateral.  Capillary refill time is immediate to all digits,  Normal temperature gradient.  Digital hair growth is present bilateral  NEUROLOGIC: sensation is normal to 5.07 monofilament at 5/5 sites bilateral.  Light touch is intact bilateral, Muscle strength normal.  MUSCULOSKELETAL: acceptable muscle strength, tone and stability bilateral.  Intrinsic muscluature intact bilateral. Plantarflexed second third metatarsals  B/L with hammer toes B/L.  DERMATOLOGIC: skin color, texture, and turgor are within normal limits.  No preulcerative lesions or ulcers  are seen, no interdigital maceration noted.  No open lesions present.  . No drainage noted. NAILS  Thick disfigured discolored nails both feet.        Assessment & Plan:  Onychomycosis B/L  IE  Debride Nails  RTC 3 months.    Gardiner Barefoot DPM

## 2017-01-27 ENCOUNTER — Ambulatory Visit: Payer: Medicare Other | Admitting: Podiatry

## 2017-01-28 ENCOUNTER — Ambulatory Visit: Payer: Medicare Other | Admitting: Podiatry

## 2017-02-03 ENCOUNTER — Ambulatory Visit (INDEPENDENT_AMBULATORY_CARE_PROVIDER_SITE_OTHER): Payer: Medicare Other | Admitting: Podiatry

## 2017-02-03 ENCOUNTER — Encounter: Payer: Self-pay | Admitting: Podiatry

## 2017-02-03 VITALS — Ht 65.0 in | Wt 142.0 lb

## 2017-02-03 DIAGNOSIS — L03032 Cellulitis of left toe: Secondary | ICD-10-CM | POA: Diagnosis not present

## 2017-02-03 NOTE — Progress Notes (Signed)
This patient presents the office chief complaint of a painful ingrown toenail big toe of the left foot. He states that the pain is noted on the outside border the big toe of the left foot. He admits to having surgery performed by Dr. Janus Molder  on the inner border the big toe left foot. Patient states that the pain comes and goes. He presents the office today for an evaluation and treatment of this condition   GENERAL APPEARANCE: Alert, conversant. Appropriately groomed. No acute distress.  VASCULAR: Pedal pulses are  palpable at  [facility] and PT bilateral.  Capillary refill time is immediate to all digits,  Normal temperature gradient.  Digital hair growth is present bilateral  NEUROLOGIC: sensation is normal to 5.07 monofilament at 5/5 sites bilateral.  Light touch is intact bilateral, Muscle strength normal.  MUSCULOSKELETAL: acceptable muscle strength, tone and stability bilateral.  Intrinsic muscluature intact bilateral.  Plantarflexed 2,3 metatarsal heads  B/L   DERMATOLOGIC: skin color, texture, and turgor are within normal limits.  No preulcerative lesions or ulcers  are seen, no interdigital maceration noted.  No open lesions present.   No drainage noted.  NAILS  Marked incurvation lateral border left great toe.  Redness and swelling noted.  Paronychia lateral border left great toe.  Incision and drainage lateral border left great toe.  Return to the clinic when necessary told the patient that if the problem persists we will consider surgical correction for the removal of the painful lateral border in the future   Gardiner Barefoot DPM

## 2017-03-01 DIAGNOSIS — C44319 Basal cell carcinoma of skin of other parts of face: Secondary | ICD-10-CM | POA: Diagnosis not present

## 2017-03-01 DIAGNOSIS — C44311 Basal cell carcinoma of skin of nose: Secondary | ICD-10-CM | POA: Diagnosis not present

## 2017-03-01 DIAGNOSIS — L821 Other seborrheic keratosis: Secondary | ICD-10-CM | POA: Diagnosis not present

## 2017-03-01 DIAGNOSIS — L814 Other melanin hyperpigmentation: Secondary | ICD-10-CM | POA: Diagnosis not present

## 2017-03-01 DIAGNOSIS — D1801 Hemangioma of skin and subcutaneous tissue: Secondary | ICD-10-CM | POA: Diagnosis not present

## 2017-03-01 DIAGNOSIS — D225 Melanocytic nevi of trunk: Secondary | ICD-10-CM | POA: Diagnosis not present

## 2017-03-15 DIAGNOSIS — L570 Actinic keratosis: Secondary | ICD-10-CM | POA: Diagnosis not present

## 2017-03-25 DIAGNOSIS — R197 Diarrhea, unspecified: Secondary | ICD-10-CM | POA: Diagnosis not present

## 2017-03-31 ENCOUNTER — Encounter: Payer: Self-pay | Admitting: Podiatry

## 2017-03-31 ENCOUNTER — Ambulatory Visit (INDEPENDENT_AMBULATORY_CARE_PROVIDER_SITE_OTHER): Payer: Medicare Other | Admitting: Podiatry

## 2017-03-31 DIAGNOSIS — B351 Tinea unguium: Secondary | ICD-10-CM

## 2017-03-31 DIAGNOSIS — M79609 Pain in unspecified limb: Secondary | ICD-10-CM

## 2017-03-31 NOTE — Progress Notes (Signed)
   Subjective:    Patient ID: Larry Mcfarland, male    DOB: May 06, 1921, 83 y.o.   MRN: [id number]  HPI this patient presents to the office with chief complaint of long thick painful nails. He states his nails have grown thick and long, but especially the big toe nail on the left foot is painful. He states the nails are painful walking and wearing his shoes. He presents the office for preventive foot care services.      Review of Systems  All other systems reviewed and are negative.      Objective:   Physical Exam GENERAL APPEARANCE: Alert, conversant. Appropriately groomed. No acute distress.  VASCULAR: Pedal pulses palpable at  [facility] and PT bilateral.  Capillary refill time is immediate to all digits,  Normal temperature gradient.  Digital hair growth is present bilateral  NEUROLOGIC: sensation is normal to 5.07 monofilament at 5/5 sites bilateral.  Light touch is intact bilateral, Muscle strength normal.  MUSCULOSKELETAL: acceptable muscle strength, tone and stability bilateral.  Intrinsic muscluature intact bilateral. Plantarflexed second third metatarsals  B/L with hammer toes B/L.  DERMATOLOGIC: skin color, texture, and turgor are within normal limits.  No preulcerative lesions or ulcers  are seen, no interdigital maceration noted.  No open lesions present.  . No drainage noted. NAILS  Thick disfigured discolored nails both feet.        Assessment & Plan:  Onychomycosis B/L  IE  Debride Nails  RTC 10 weeks.  No pain related to his ingrown toenail at this visit.    Gardiner Barefoot DPM

## 2017-04-21 DIAGNOSIS — R1013 Epigastric pain: Secondary | ICD-10-CM | POA: Diagnosis not present

## 2017-05-07 DIAGNOSIS — R5383 Other fatigue: Secondary | ICD-10-CM | POA: Diagnosis not present

## 2017-05-07 DIAGNOSIS — R531 Weakness: Secondary | ICD-10-CM | POA: Diagnosis not present

## 2017-06-07 DIAGNOSIS — E7800 Pure hypercholesterolemia, unspecified: Secondary | ICD-10-CM | POA: Diagnosis not present

## 2017-06-07 DIAGNOSIS — I340 Nonrheumatic mitral (valve) insufficiency: Secondary | ICD-10-CM | POA: Diagnosis not present

## 2017-06-07 DIAGNOSIS — N183 Chronic kidney disease, stage 3 (moderate): Secondary | ICD-10-CM | POA: Diagnosis not present

## 2017-06-07 DIAGNOSIS — R269 Unspecified abnormalities of gait and mobility: Secondary | ICD-10-CM | POA: Diagnosis not present

## 2017-06-07 DIAGNOSIS — J302 Other seasonal allergic rhinitis: Secondary | ICD-10-CM | POA: Diagnosis not present

## 2017-06-08 NOTE — Progress Notes (Signed)
r  Chief Complaint  Patient presents with  . Follow-up    mitral regurgitation     History of Present Illness: 83 yo male with history of chronic kidney disease, paroxysmal atrial fibrillation, GERD, HTN, HLD and severe mitral regurgitation who is here today for follow up. He had been followed by Dr. Johnsie Cancel in our office. I saw him  in the valve clinic as a new patient in October 2016 to discuss his severe mitral regurgitation. 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 After a long discussion with the patient and his family, we elected for conservative management of his valve disease. He has remote atrial fibrillation but no recent recurrences. He has normal LV systolic function.    He is here today for follow up. The patient denies any chest pain, dyspnea, palpitations, lower extremity edema, orthopnea, PND, dizziness, near syncope or syncope. He continues to have fatigue. He does walk 1/2 mile day.   Primary Care Physician: Seward Carol, MD   Past Medical History:  Diagnosis Date  . Anginal pain ([place])   . Anxiety   . Bleeding from the nose 2009   cautery by Dr. Erik Obey  . BPH (benign prostatic hyperplasia)   . CAD (coronary artery disease)   . Cataract    right eye  . CKD (chronic kidney disease), stage III   . Distal radius fracture 2013   Dr. Fredna Dow  . Dysrhythmia    hx of atrial fib- 10 years ago   . GERD (gastroesophageal reflux disease)   . Gout   . H/O hiatal hernia   . Hyperlipemia   . Hypertension   . Impaired fasting glucose   . Inguinal hernia without mention of obstruction or gangrene, unilateral or unspecified, (not specified as recurrent)   . Lumbar radiculopathy   . Major depressive disorder, single episode, unspecified   . Nosebleed    most recent 05/24/13  to have  cauterized prior to surgery   . Osteoarthritis   . Osteopenia 2002  . Skin cancer of forehead    left (BCE)  . Vertigo 1999   negative ct scan  head; rare recurrance, last in 2013  . Vision disturbance     Past Surgical History:  Procedure Laterality Date  . ESOPHAGOGASTRODUODENOSCOPY N/A 07/10/2014   Procedure: ESOPHAGOGASTRODUODENOSCOPY (EGD);  Surgeon: Garlan Fair, MD;  Location: [facility];  Service: Endoscopy;  Laterality: N/A;  . EYE SURGERY     right eye cataract surgery   . GASTROSTOMY N/A 10/03/2014   Procedure: GASTROSTOMY TUBE;  Surgeon: Fanny Skates, MD;  Location: [facility];  Service: General;  Laterality: N/A;  . HIATAL HERNIA REPAIR N/A 10/03/2014   Procedure: [place], POSSIBLE OPEN;  Surgeon: Fanny Skates, MD;  Location: [facility];  Service: General;  Laterality: N/A;  . INGUINAL HERNIA REPAIR Right 06/07/2013   Procedure: LAPAROSCOPIC INGUINAL HERNIA REPAIR;  Surgeon: Adin Hector, MD;  Location: [facility];  Service: General;  Laterality: Right;  With Mesh  . INSERTION OF MESH Right 06/07/2013   Procedure: INSERTION OF MESH;  Surgeon: Adin Hector, MD;  Location: [facility];  Service: General;  Laterality: Right;  . TONSILLECTOMY     at age 3 yrs old    Current Outpatient Prescriptions  Medication Sig Dispense Refill  . ALPRAZolam (XANAX) 0.25 MG tablet Take 0.25 mg by mouth 2 (two) times daily as needed for anxiety.    Marland Kitchen aspirin 81 MG  tablet Take 81 mg by mouth daily.    . cephALEXin (KEFLEX) 500 MG capsule Take 4 capsules by mouth 30-60 minutes prior to dental appointment. 4 capsule 4  . losartan (COZAAR) 25 MG tablet Take 25 mg by mouth daily.    . metoprolol tartrate (LOPRESSOR) 25 MG tablet Take 12.5 mg by mouth 2 (two) times daily.    Marland Kitchen omeprazole (PRILOSEC) 20 MG capsule Take 40 mg by mouth every morning.    . polyethylene glycol (MIRALAX / GLYCOLAX) packet Take 17 g by mouth every other day.    . pravastatin (PRAVACHOL) 20 MG tablet Take 20 mg by mouth at bedtime.     No current facility-administered medications for this visit.     Allergies  Allergen Reactions  .  Penicillins Nausea Only    Upset stomach .     Social History   Social History  . Marital status: Widowed    Spouse name: N/A  . Number of children: 2  . Years of education: N/A   Occupational History  . 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 Smoking status: Never Smoker  . Smokeless tobacco: Never Used  . Alcohol use Yes     Comment: occasional  . Drug use: No  . Sexual activity: Not on file   Other Topics Concern  . Not on file   Social History Narrative  . No narrative on file    Family History  Problem Relation Age of Onset  . Heart disease Mother   . Heart disease Father   . Cancer Sister        breast     Review of Systems:  As stated in the HPI and otherwise negative.   BP 128/70   Pulse 62   Ht 5\' 6"  (1.676 m)   Wt 134 lb (60.8 kg)   SpO2 100%   BMI 21.63 kg/m   Physical Examination:  General: Well developed, well nourished, NAD  HEENT: OP clear, mucus membranes moist  SKIN: warm, dry. No rashes. Neuro: No focal deficits  Musculoskeletal: Muscle strength 5/5 all ext  Psychiatric: Mood and affect normal  Neck: No JVD, no carotid bruits, no thyromegaly, no lymphadenopathy.  Lungs:Clear bilaterally, no wheezes, rhonci, crackles Cardiovascular: Regular rate and rhythm. Loud systolic murmur.  Abdomen:Soft. Bowel sounds present. Non-tender.  Extremities: No lower extremity edema. Pulses are 2 + in the bilateral DP/PT.  Echo 08/24/16: Left ventricle: The cavity size was normal. There was moderate   concentric hypertrophy. Systolic function was normal. The   estimated ejection fraction was in the range of 60% to 65%. Wall   motion was normal; there were no regional wall motion   abnormalities. Features are consistent with a pseudonormal left   ventricular filling pattern, with concomitant abnormal relaxation   and increased filling pressure (grade 2 diastolic dysfunction). - Aortic valve: There  was trivial regurgitation. - Mitral valve: Probable, severe flail motion involving the lateral   scallop of the posterior leaflet due to rupture of one or more   chords. There was severe regurgitation directed eccentrically and   toward the septum. Severe regurgitation is suggested by pulmonary   vein systolic flow reversal. - Left atrium: The atrium was moderately dilated. - Tricuspid valve: There was mild-moderate regurgitation directed   centrally. - Pulmonary arteries: Systolic pressure was moderately increased.   PA peak pressure: 56 mm Hg (S).  Recommendations:  Consider transesophageal echocardiography to  better identify the anatomical abnormality leading to severe mitral insufficiency.  EKG:  EKG is not ordered today. The ekg ordered today demonstrates    Recent Labs: 12/28/2016: BUN 26; Creatinine, Ser 1.86; Hemoglobin 11.1; Platelets 161; Potassium 4.3; Sodium 137   Lipid Panel No results found for: CHOL, TRIG, HDL, CHOLHDL, VLDL, LDLCALC, LDLDIRECT   Wt Readings from Last 3 Encounters:  06/09/17 134 lb (60.8 kg)  02/03/17 142 lb (64.4 kg)  01/07/17 142 lb (64.4 kg)     Other studies Reviewed: Additional studies/ records that were reviewed today include: . Review of the above records demonstrates:   Assessment and Plan:   1. 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 He has fatigue but no significant dyspnea or lower extremity edema. Will continue with plan for conservative management of his mitral valve disease. No signs of CHF at this time. He will continue to use antibiotic prophylaxis before procedures.   Current medicines are reviewed at length with the patient today.  The patient does not have concerns regarding medicines.  The following changes have been made:  no change  Labs/ tests ordered today include:   No orders of the defined types were placed in this  encounter.    Disposition:   FU with me in 6 months   Signed, Lauree Chandler, MD 06/09/2017 10:23 AM    [facility] [address], [place], [place]  [address] Phone: [phone]; Fax: [phone]

## 2017-06-09 ENCOUNTER — Ambulatory Visit: Payer: Medicare Other | Admitting: Podiatry

## 2017-06-09 ENCOUNTER — Ambulatory Visit (INDEPENDENT_AMBULATORY_CARE_PROVIDER_SITE_OTHER): Payer: Medicare Other | Admitting: Cardiovascular Disease

## 2017-06-09 ENCOUNTER — Encounter: Payer: Self-pay | Admitting: Cardiovascular Disease

## 2017-06-09 ENCOUNTER — Encounter (INDEPENDENT_AMBULATORY_CARE_PROVIDER_SITE_OTHER): Payer: Self-pay

## 2017-06-09 VITALS — BP 128/70 | HR 62 | Ht 66.0 in | Wt 134.0 lb

## 2017-06-09 DIAGNOSIS — I340 Nonrheumatic mitral (valve) insufficiency: Secondary | ICD-10-CM

## 2017-06-09 NOTE — Patient Instructions (Signed)
Medication Instructions:  Your physician recommends that you continue on your current medications as directed. Please refer to the Current Medication list given to you today.   Labwork: none  Testing/Procedures: none  Follow-Up: Your physician recommends that you schedule a follow-up appointment in: 6 months. Please call our office in abut 3 months to schedule this appointment    Any Other Special Instructions Will Be Listed Below (If Applicable).     If you need a refill on your cardiac medications before your next appointment, please call your pharmacy.

## 2017-06-10 DIAGNOSIS — M6281 Muscle weakness (generalized): Secondary | ICD-10-CM | POA: Diagnosis not present

## 2017-06-10 DIAGNOSIS — R262 Difficulty in walking, not elsewhere classified: Secondary | ICD-10-CM | POA: Diagnosis not present

## 2017-06-10 DIAGNOSIS — R278 Other lack of coordination: Secondary | ICD-10-CM | POA: Diagnosis not present

## 2017-06-10 DIAGNOSIS — R2681 Unsteadiness on feet: Secondary | ICD-10-CM | POA: Diagnosis not present

## 2017-06-14 DIAGNOSIS — L905 Scar conditions and fibrosis of skin: Secondary | ICD-10-CM | POA: Diagnosis not present

## 2017-06-14 DIAGNOSIS — L570 Actinic keratosis: Secondary | ICD-10-CM | POA: Diagnosis not present

## 2017-06-14 DIAGNOSIS — Z85828 Personal history of other malignant neoplasm of skin: Secondary | ICD-10-CM | POA: Diagnosis not present

## 2017-06-15 DIAGNOSIS — R2681 Unsteadiness on feet: Secondary | ICD-10-CM | POA: Diagnosis not present

## 2017-06-15 DIAGNOSIS — R278 Other lack of coordination: Secondary | ICD-10-CM | POA: Diagnosis not present

## 2017-06-15 DIAGNOSIS — R262 Difficulty in walking, not elsewhere classified: Secondary | ICD-10-CM | POA: Diagnosis not present

## 2017-06-15 DIAGNOSIS — M6281 Muscle weakness (generalized): Secondary | ICD-10-CM | POA: Diagnosis not present

## 2017-06-16 ENCOUNTER — Encounter: Payer: Self-pay | Admitting: Podiatry

## 2017-06-16 ENCOUNTER — Ambulatory Visit (INDEPENDENT_AMBULATORY_CARE_PROVIDER_SITE_OTHER): Payer: Medicare Other | Admitting: Podiatry

## 2017-06-16 DIAGNOSIS — B351 Tinea unguium: Secondary | ICD-10-CM

## 2017-06-16 DIAGNOSIS — M79609 Pain in unspecified limb: Secondary | ICD-10-CM | POA: Diagnosis not present

## 2017-06-16 DIAGNOSIS — M2030 Hallux varus (acquired), unspecified foot: Secondary | ICD-10-CM

## 2017-06-16 NOTE — Progress Notes (Signed)
   Subjective:    Patient ID: Larry Mcfarland, male    DOB: 01-07-1921, 83 y.o.   MRN: [id number]  HPI this patient presents to the office with chief complaint of long thick painful nails. He states his nails have grown thick and long, but especially the big toe nail on the left foot is painful. He states the nails are painful walking and wearing his shoes. He presents the office for preventive foot care services.  Patient says he has developed pain on the tops of both big toe.    Review of Systems  All other systems reviewed and are negative.      Objective:   Physical Exam GENERAL APPEARANCE: Alert, conversant. Appropriately groomed. No acute distress.  VASCULAR: Pedal pulses palpable at  [facility] and PT bilateral.  Capillary refill time is immediate to all digits,  Normal temperature gradient.  Digital hair growth is present bilateral  NEUROLOGIC: sensation is normal to 5.07 monofilament at 5/5 sites bilateral.  Light touch is intact bilateral, Muscle strength normal.  MUSCULOSKELETAL: acceptable muscle strength, tone and stability bilateral.  Intrinsic muscluature intact bilateral. Plantarflexed second third metatarsals  B/L with hammer toes B/L.  DERMATOLOGIC: skin color, texture, and turgor are within normal limits.  No preulcerative lesions or ulcers  are seen, no interdigital maceration noted.  No open lesions present.  . No drainage noted. NAILS  Thick disfigured discolored nails both feet.        Assessment & Plan:  Onychomycosis B/L  Hllux malleus  B/L  IE  Debride Nails  RTC 10 weeks. Padding dispensed for hallux malleus.  Prescribe spenco arch cushion.     Gardiner Barefoot DPM

## 2017-06-17 DIAGNOSIS — R278 Other lack of coordination: Secondary | ICD-10-CM | POA: Diagnosis not present

## 2017-06-17 DIAGNOSIS — M6281 Muscle weakness (generalized): Secondary | ICD-10-CM | POA: Diagnosis not present

## 2017-06-17 DIAGNOSIS — R2681 Unsteadiness on feet: Secondary | ICD-10-CM | POA: Diagnosis not present

## 2017-06-17 DIAGNOSIS — R262 Difficulty in walking, not elsewhere classified: Secondary | ICD-10-CM | POA: Diagnosis not present

## 2017-06-22 DIAGNOSIS — R262 Difficulty in walking, not elsewhere classified: Secondary | ICD-10-CM | POA: Diagnosis not present

## 2017-06-22 DIAGNOSIS — R2681 Unsteadiness on feet: Secondary | ICD-10-CM | POA: Diagnosis not present

## 2017-06-22 DIAGNOSIS — R278 Other lack of coordination: Secondary | ICD-10-CM | POA: Diagnosis not present

## 2017-06-22 DIAGNOSIS — M6281 Muscle weakness (generalized): Secondary | ICD-10-CM | POA: Diagnosis not present

## 2017-06-24 DIAGNOSIS — R278 Other lack of coordination: Secondary | ICD-10-CM | POA: Diagnosis not present

## 2017-06-24 DIAGNOSIS — M6281 Muscle weakness (generalized): Secondary | ICD-10-CM | POA: Diagnosis not present

## 2017-06-24 DIAGNOSIS — R262 Difficulty in walking, not elsewhere classified: Secondary | ICD-10-CM | POA: Diagnosis not present

## 2017-06-24 DIAGNOSIS — R2681 Unsteadiness on feet: Secondary | ICD-10-CM | POA: Diagnosis not present

## 2017-06-28 DIAGNOSIS — R262 Difficulty in walking, not elsewhere classified: Secondary | ICD-10-CM | POA: Diagnosis not present

## 2017-06-28 DIAGNOSIS — M6281 Muscle weakness (generalized): Secondary | ICD-10-CM | POA: Diagnosis not present

## 2017-06-28 DIAGNOSIS — R2681 Unsteadiness on feet: Secondary | ICD-10-CM | POA: Diagnosis not present

## 2017-06-28 DIAGNOSIS — R278 Other lack of coordination: Secondary | ICD-10-CM | POA: Diagnosis not present

## 2017-06-29 DIAGNOSIS — R2681 Unsteadiness on feet: Secondary | ICD-10-CM | POA: Diagnosis not present

## 2017-06-29 DIAGNOSIS — R278 Other lack of coordination: Secondary | ICD-10-CM | POA: Diagnosis not present

## 2017-06-29 DIAGNOSIS — R262 Difficulty in walking, not elsewhere classified: Secondary | ICD-10-CM | POA: Diagnosis not present

## 2017-06-29 DIAGNOSIS — M6281 Muscle weakness (generalized): Secondary | ICD-10-CM | POA: Diagnosis not present

## 2017-07-02 DIAGNOSIS — M6281 Muscle weakness (generalized): Secondary | ICD-10-CM | POA: Diagnosis not present

## 2017-07-02 DIAGNOSIS — R278 Other lack of coordination: Secondary | ICD-10-CM | POA: Diagnosis not present

## 2017-07-02 DIAGNOSIS — R2681 Unsteadiness on feet: Secondary | ICD-10-CM | POA: Diagnosis not present

## 2017-07-02 DIAGNOSIS — R262 Difficulty in walking, not elsewhere classified: Secondary | ICD-10-CM | POA: Diagnosis not present

## 2017-07-06 DIAGNOSIS — R278 Other lack of coordination: Secondary | ICD-10-CM | POA: Diagnosis not present

## 2017-07-06 DIAGNOSIS — M6281 Muscle weakness (generalized): Secondary | ICD-10-CM | POA: Diagnosis not present

## 2017-07-06 DIAGNOSIS — R262 Difficulty in walking, not elsewhere classified: Secondary | ICD-10-CM | POA: Diagnosis not present

## 2017-07-06 DIAGNOSIS — R2681 Unsteadiness on feet: Secondary | ICD-10-CM | POA: Diagnosis not present

## 2017-07-08 DIAGNOSIS — R278 Other lack of coordination: Secondary | ICD-10-CM | POA: Diagnosis not present

## 2017-07-08 DIAGNOSIS — R262 Difficulty in walking, not elsewhere classified: Secondary | ICD-10-CM | POA: Diagnosis not present

## 2017-07-08 DIAGNOSIS — R2681 Unsteadiness on feet: Secondary | ICD-10-CM | POA: Diagnosis not present

## 2017-07-08 DIAGNOSIS — M6281 Muscle weakness (generalized): Secondary | ICD-10-CM | POA: Diagnosis not present

## 2017-07-14 DIAGNOSIS — R262 Difficulty in walking, not elsewhere classified: Secondary | ICD-10-CM | POA: Diagnosis not present

## 2017-07-14 DIAGNOSIS — R2681 Unsteadiness on feet: Secondary | ICD-10-CM | POA: Diagnosis not present

## 2017-07-14 DIAGNOSIS — M6281 Muscle weakness (generalized): Secondary | ICD-10-CM | POA: Diagnosis not present

## 2017-07-14 DIAGNOSIS — R278 Other lack of coordination: Secondary | ICD-10-CM | POA: Diagnosis not present

## 2017-07-16 DIAGNOSIS — M6281 Muscle weakness (generalized): Secondary | ICD-10-CM | POA: Diagnosis not present

## 2017-07-16 DIAGNOSIS — R262 Difficulty in walking, not elsewhere classified: Secondary | ICD-10-CM | POA: Diagnosis not present

## 2017-07-16 DIAGNOSIS — R278 Other lack of coordination: Secondary | ICD-10-CM | POA: Diagnosis not present

## 2017-07-16 DIAGNOSIS — R2681 Unsteadiness on feet: Secondary | ICD-10-CM | POA: Diagnosis not present

## 2017-07-20 DIAGNOSIS — R2681 Unsteadiness on feet: Secondary | ICD-10-CM | POA: Diagnosis not present

## 2017-07-20 DIAGNOSIS — R262 Difficulty in walking, not elsewhere classified: Secondary | ICD-10-CM | POA: Diagnosis not present

## 2017-07-20 DIAGNOSIS — M6281 Muscle weakness (generalized): Secondary | ICD-10-CM | POA: Diagnosis not present

## 2017-07-20 DIAGNOSIS — R278 Other lack of coordination: Secondary | ICD-10-CM | POA: Diagnosis not present

## 2017-07-22 DIAGNOSIS — R278 Other lack of coordination: Secondary | ICD-10-CM | POA: Diagnosis not present

## 2017-07-22 DIAGNOSIS — M6281 Muscle weakness (generalized): Secondary | ICD-10-CM | POA: Diagnosis not present

## 2017-07-22 DIAGNOSIS — R2681 Unsteadiness on feet: Secondary | ICD-10-CM | POA: Diagnosis not present

## 2017-07-22 DIAGNOSIS — R262 Difficulty in walking, not elsewhere classified: Secondary | ICD-10-CM | POA: Diagnosis not present

## 2017-07-28 DIAGNOSIS — R278 Other lack of coordination: Secondary | ICD-10-CM | POA: Diagnosis not present

## 2017-07-28 DIAGNOSIS — M6281 Muscle weakness (generalized): Secondary | ICD-10-CM | POA: Diagnosis not present

## 2017-07-28 DIAGNOSIS — R262 Difficulty in walking, not elsewhere classified: Secondary | ICD-10-CM | POA: Diagnosis not present

## 2017-07-28 DIAGNOSIS — R2681 Unsteadiness on feet: Secondary | ICD-10-CM | POA: Diagnosis not present

## 2017-07-29 DIAGNOSIS — R2681 Unsteadiness on feet: Secondary | ICD-10-CM | POA: Diagnosis not present

## 2017-07-29 DIAGNOSIS — R278 Other lack of coordination: Secondary | ICD-10-CM | POA: Diagnosis not present

## 2017-07-29 DIAGNOSIS — M6281 Muscle weakness (generalized): Secondary | ICD-10-CM | POA: Diagnosis not present

## 2017-07-29 DIAGNOSIS — R262 Difficulty in walking, not elsewhere classified: Secondary | ICD-10-CM | POA: Diagnosis not present

## 2017-08-04 DIAGNOSIS — R278 Other lack of coordination: Secondary | ICD-10-CM | POA: Diagnosis not present

## 2017-08-04 DIAGNOSIS — R262 Difficulty in walking, not elsewhere classified: Secondary | ICD-10-CM | POA: Diagnosis not present

## 2017-08-04 DIAGNOSIS — M6281 Muscle weakness (generalized): Secondary | ICD-10-CM | POA: Diagnosis not present

## 2017-08-04 DIAGNOSIS — R2681 Unsteadiness on feet: Secondary | ICD-10-CM | POA: Diagnosis not present

## 2017-08-06 DIAGNOSIS — R2681 Unsteadiness on feet: Secondary | ICD-10-CM | POA: Diagnosis not present

## 2017-08-06 DIAGNOSIS — R262 Difficulty in walking, not elsewhere classified: Secondary | ICD-10-CM | POA: Diagnosis not present

## 2017-08-06 DIAGNOSIS — R278 Other lack of coordination: Secondary | ICD-10-CM | POA: Diagnosis not present

## 2017-08-06 DIAGNOSIS — M6281 Muscle weakness (generalized): Secondary | ICD-10-CM | POA: Diagnosis not present

## 2017-08-11 DIAGNOSIS — M6281 Muscle weakness (generalized): Secondary | ICD-10-CM | POA: Diagnosis not present

## 2017-08-11 DIAGNOSIS — R262 Difficulty in walking, not elsewhere classified: Secondary | ICD-10-CM | POA: Diagnosis not present

## 2017-08-11 DIAGNOSIS — R278 Other lack of coordination: Secondary | ICD-10-CM | POA: Diagnosis not present

## 2017-08-11 DIAGNOSIS — R2681 Unsteadiness on feet: Secondary | ICD-10-CM | POA: Diagnosis not present

## 2017-08-13 DIAGNOSIS — R262 Difficulty in walking, not elsewhere classified: Secondary | ICD-10-CM | POA: Diagnosis not present

## 2017-08-13 DIAGNOSIS — M6281 Muscle weakness (generalized): Secondary | ICD-10-CM | POA: Diagnosis not present

## 2017-08-13 DIAGNOSIS — R278 Other lack of coordination: Secondary | ICD-10-CM | POA: Diagnosis not present

## 2017-08-13 DIAGNOSIS — R2681 Unsteadiness on feet: Secondary | ICD-10-CM | POA: Diagnosis not present

## 2017-08-17 DIAGNOSIS — R278 Other lack of coordination: Secondary | ICD-10-CM | POA: Diagnosis not present

## 2017-08-17 DIAGNOSIS — R2681 Unsteadiness on feet: Secondary | ICD-10-CM | POA: Diagnosis not present

## 2017-08-17 DIAGNOSIS — M6281 Muscle weakness (generalized): Secondary | ICD-10-CM | POA: Diagnosis not present

## 2017-08-17 DIAGNOSIS — R262 Difficulty in walking, not elsewhere classified: Secondary | ICD-10-CM | POA: Diagnosis not present

## 2017-08-19 DIAGNOSIS — R2681 Unsteadiness on feet: Secondary | ICD-10-CM | POA: Diagnosis not present

## 2017-08-19 DIAGNOSIS — M6281 Muscle weakness (generalized): Secondary | ICD-10-CM | POA: Diagnosis not present

## 2017-08-19 DIAGNOSIS — R262 Difficulty in walking, not elsewhere classified: Secondary | ICD-10-CM | POA: Diagnosis not present

## 2017-08-19 DIAGNOSIS — R278 Other lack of coordination: Secondary | ICD-10-CM | POA: Diagnosis not present

## 2017-08-23 DIAGNOSIS — N401 Enlarged prostate with lower urinary tract symptoms: Secondary | ICD-10-CM | POA: Diagnosis not present

## 2017-08-23 DIAGNOSIS — R351 Nocturia: Secondary | ICD-10-CM | POA: Diagnosis not present

## 2017-08-24 DIAGNOSIS — R2681 Unsteadiness on feet: Secondary | ICD-10-CM | POA: Diagnosis not present

## 2017-08-24 DIAGNOSIS — R262 Difficulty in walking, not elsewhere classified: Secondary | ICD-10-CM | POA: Diagnosis not present

## 2017-08-24 DIAGNOSIS — R278 Other lack of coordination: Secondary | ICD-10-CM | POA: Diagnosis not present

## 2017-08-24 DIAGNOSIS — M6281 Muscle weakness (generalized): Secondary | ICD-10-CM | POA: Diagnosis not present

## 2017-08-25 ENCOUNTER — Encounter: Payer: Self-pay | Admitting: Podiatry

## 2017-08-25 ENCOUNTER — Ambulatory Visit (INDEPENDENT_AMBULATORY_CARE_PROVIDER_SITE_OTHER): Payer: Medicare Other | Admitting: Podiatry

## 2017-08-25 DIAGNOSIS — B351 Tinea unguium: Secondary | ICD-10-CM | POA: Diagnosis not present

## 2017-08-25 DIAGNOSIS — M79609 Pain in unspecified limb: Secondary | ICD-10-CM | POA: Diagnosis not present

## 2017-08-25 DIAGNOSIS — M2030 Hallux varus (acquired), unspecified foot: Secondary | ICD-10-CM

## 2017-08-25 NOTE — Progress Notes (Signed)
   Subjective:    Patient ID: Larry Mcfarland, male    DOB: December 19, 1920, 83 y.o.   MRN: [id number]  HPI this patient presents to the office with chief complaint of long thick painful nails. He states his nails have grown thick and long, but especially the big toe nail on the left foot is painful. He states the nails are painful walking and wearing his shoes. He presents the office for preventive foot care services.  Patient says he has developed pain on the tops of both big toe.    Review of Systems  All other systems reviewed and are negative.      Objective:   Physical Exam GENERAL APPEARANCE: Alert, conversant. Appropriately groomed. No acute distress.  VASCULAR: Pedal pulses palpable at  [facility] and PT bilateral.  Capillary refill time is immediate to all digits,  Normal temperature gradient.  Digital hair growth is present bilateral  NEUROLOGIC: sensation is normal to 5.07 monofilament at 5/5 sites bilateral.  Light touch is intact bilateral, Muscle strength normal.  MUSCULOSKELETAL: acceptable muscle strength, tone and stability bilateral.  Intrinsic muscluature intact bilateral. Plantarflexed second third metatarsals  B/L with hammer toes B/L.  DERMATOLOGIC: skin color, texture, and turgor are within normal limits.  No preulcerative lesions or ulcers  are seen, no interdigital maceration noted.  No open lesions present.  . No drainage noted. NAILS  Thick disfigured discolored nails both feet.        Assessment & Plan:  Onychomycosis B/L  Hllux malleus  B/L  IE  Debride Nails  RTC 10 weeks.      Gardiner Barefoot DPM

## 2017-08-26 DIAGNOSIS — R278 Other lack of coordination: Secondary | ICD-10-CM | POA: Diagnosis not present

## 2017-08-26 DIAGNOSIS — M6281 Muscle weakness (generalized): Secondary | ICD-10-CM | POA: Diagnosis not present

## 2017-08-26 DIAGNOSIS — R262 Difficulty in walking, not elsewhere classified: Secondary | ICD-10-CM | POA: Diagnosis not present

## 2017-08-26 DIAGNOSIS — R2681 Unsteadiness on feet: Secondary | ICD-10-CM | POA: Diagnosis not present

## 2017-08-30 DIAGNOSIS — M6281 Muscle weakness (generalized): Secondary | ICD-10-CM | POA: Diagnosis not present

## 2017-08-30 DIAGNOSIS — R2681 Unsteadiness on feet: Secondary | ICD-10-CM | POA: Diagnosis not present

## 2017-08-30 DIAGNOSIS — R262 Difficulty in walking, not elsewhere classified: Secondary | ICD-10-CM | POA: Diagnosis not present

## 2017-08-30 DIAGNOSIS — R278 Other lack of coordination: Secondary | ICD-10-CM | POA: Diagnosis not present

## 2017-09-02 DIAGNOSIS — R2681 Unsteadiness on feet: Secondary | ICD-10-CM | POA: Diagnosis not present

## 2017-09-02 DIAGNOSIS — R278 Other lack of coordination: Secondary | ICD-10-CM | POA: Diagnosis not present

## 2017-09-02 DIAGNOSIS — M6281 Muscle weakness (generalized): Secondary | ICD-10-CM | POA: Diagnosis not present

## 2017-09-02 DIAGNOSIS — R262 Difficulty in walking, not elsewhere classified: Secondary | ICD-10-CM | POA: Diagnosis not present

## 2017-09-06 DIAGNOSIS — R278 Other lack of coordination: Secondary | ICD-10-CM | POA: Diagnosis not present

## 2017-09-06 DIAGNOSIS — R2681 Unsteadiness on feet: Secondary | ICD-10-CM | POA: Diagnosis not present

## 2017-09-06 DIAGNOSIS — M6281 Muscle weakness (generalized): Secondary | ICD-10-CM | POA: Diagnosis not present

## 2017-09-06 DIAGNOSIS — R262 Difficulty in walking, not elsewhere classified: Secondary | ICD-10-CM | POA: Diagnosis not present

## 2017-09-08 DIAGNOSIS — R2681 Unsteadiness on feet: Secondary | ICD-10-CM | POA: Diagnosis not present

## 2017-09-08 DIAGNOSIS — R278 Other lack of coordination: Secondary | ICD-10-CM | POA: Diagnosis not present

## 2017-09-08 DIAGNOSIS — R262 Difficulty in walking, not elsewhere classified: Secondary | ICD-10-CM | POA: Diagnosis not present

## 2017-09-08 DIAGNOSIS — M6281 Muscle weakness (generalized): Secondary | ICD-10-CM | POA: Diagnosis not present

## 2017-09-13 DIAGNOSIS — R278 Other lack of coordination: Secondary | ICD-10-CM | POA: Diagnosis not present

## 2017-09-13 DIAGNOSIS — R262 Difficulty in walking, not elsewhere classified: Secondary | ICD-10-CM | POA: Diagnosis not present

## 2017-09-13 DIAGNOSIS — M6281 Muscle weakness (generalized): Secondary | ICD-10-CM | POA: Diagnosis not present

## 2017-09-13 DIAGNOSIS — R2681 Unsteadiness on feet: Secondary | ICD-10-CM | POA: Diagnosis not present

## 2017-09-15 DIAGNOSIS — R2681 Unsteadiness on feet: Secondary | ICD-10-CM | POA: Diagnosis not present

## 2017-09-15 DIAGNOSIS — M6281 Muscle weakness (generalized): Secondary | ICD-10-CM | POA: Diagnosis not present

## 2017-09-15 DIAGNOSIS — R262 Difficulty in walking, not elsewhere classified: Secondary | ICD-10-CM | POA: Diagnosis not present

## 2017-09-15 DIAGNOSIS — R278 Other lack of coordination: Secondary | ICD-10-CM | POA: Diagnosis not present

## 2017-11-03 ENCOUNTER — Encounter: Payer: Self-pay | Admitting: Podiatry

## 2017-11-03 ENCOUNTER — Ambulatory Visit (INDEPENDENT_AMBULATORY_CARE_PROVIDER_SITE_OTHER): Payer: Medicare Other | Admitting: Podiatry

## 2017-11-03 DIAGNOSIS — B351 Tinea unguium: Secondary | ICD-10-CM

## 2017-11-03 DIAGNOSIS — M2030 Hallux varus (acquired), unspecified foot: Secondary | ICD-10-CM

## 2017-11-03 DIAGNOSIS — M79609 Pain in unspecified limb: Secondary | ICD-10-CM

## 2017-11-03 NOTE — Progress Notes (Signed)
Complaint:  Visit Type: Patient returns to my office for continued preventative foot care services. Complaint: Patient states" my nails have grown long and thick and become painful to walk and wear shoes" The patient presents for preventative foot care services. No changes to ROS  Podiatric Exam: Vascular: dorsalis pedis and posterior tibial pulses are palpable bilateral. Capillary return is immediate. Temperature gradient is WNL. Skin turgor WNL  Sensorium: Normal Semmes Weinstein monofilament test. Normal tactile sensation bilaterally. Nail Exam: Pt has thick disfigured discolored nails with subungual debris noted bilateral entire nail hallux through fifth toenails Ulcer Exam: There is no evidence of ulcer or pre-ulcerative changes or infection. Orthopedic Exam: Muscle tone and strength are WNL. No limitations in general ROM. No crepitus or effusions noted. Plantar flexed 2,3  B/L.  Hammer toes  B/L. Skin: No Porokeratosis. No infection or ulcers  Diagnosis:  Onychomycosis, , Pain in right toe, pain in left toes  Treatment & Plan Procedures and Treatment: Consent by patient was obtained for treatment procedures.   Debridement of mycotic and hypertrophic toenails, 1 through 5 bilateral and clearing of subungual debris. No ulceration, no infection noted. ABN signed for 2018. Return Visit-Office Procedure: Patient instructed to return to the office for a follow up visit 3 months for continued evaluation and treatment.    Gardiner Barefoot DPM

## 2017-11-22 ENCOUNTER — Inpatient Hospital Stay (HOSPITAL_COMMUNITY)
Admission: EM | Admit: 2017-11-22 | Discharge: 2017-11-24 | DRG: 149 | Disposition: A | Discharge status: Home / Self Care | Payer: Medicare Other | Attending: Nephrology | Admitting: Nephrology

## 2017-11-22 ENCOUNTER — Encounter (HOSPITAL_COMMUNITY): Payer: Self-pay

## 2017-11-22 ENCOUNTER — Emergency Department (HOSPITAL_COMMUNITY): Payer: Medicare Other

## 2017-11-22 ENCOUNTER — Other Ambulatory Visit: Payer: Self-pay

## 2017-11-22 DIAGNOSIS — K219 Gastro-esophageal reflux disease without esophagitis: Secondary | ICD-10-CM | POA: Diagnosis present

## 2017-11-22 DIAGNOSIS — R404 Transient alteration of awareness: Secondary | ICD-10-CM | POA: Diagnosis not present

## 2017-11-22 DIAGNOSIS — E785 Hyperlipidemia, unspecified: Secondary | ICD-10-CM | POA: Diagnosis present

## 2017-11-22 DIAGNOSIS — I2510 Atherosclerotic heart disease of native coronary artery without angina pectoris: Secondary | ICD-10-CM | POA: Diagnosis not present

## 2017-11-22 DIAGNOSIS — Z880 Allergy status to penicillin: Secondary | ICD-10-CM

## 2017-11-22 DIAGNOSIS — N183 Chronic kidney disease, stage 3 unspecified: Secondary | ICD-10-CM

## 2017-11-22 DIAGNOSIS — M1990 Unspecified osteoarthritis, unspecified site: Secondary | ICD-10-CM | POA: Diagnosis present

## 2017-11-22 DIAGNOSIS — Z79899 Other long term (current) drug therapy: Secondary | ICD-10-CM

## 2017-11-22 DIAGNOSIS — Z8249 Family history of ischemic heart disease and other diseases of the circulatory system: Secondary | ICD-10-CM

## 2017-11-22 DIAGNOSIS — G459 Transient cerebral ischemic attack, unspecified: Secondary | ICD-10-CM

## 2017-11-22 DIAGNOSIS — Z85828 Personal history of other malignant neoplasm of skin: Secondary | ICD-10-CM

## 2017-11-22 DIAGNOSIS — R42 Dizziness and giddiness: Secondary | ICD-10-CM

## 2017-11-22 DIAGNOSIS — R9431 Abnormal electrocardiogram [ECG] [EKG]: Secondary | ICD-10-CM | POA: Diagnosis not present

## 2017-11-22 DIAGNOSIS — Z7982 Long term (current) use of aspirin: Secondary | ICD-10-CM

## 2017-11-22 DIAGNOSIS — I129 Hypertensive chronic kidney disease with stage 1 through stage 4 chronic kidney disease, or unspecified chronic kidney disease: Secondary | ICD-10-CM | POA: Diagnosis not present

## 2017-11-22 LAB — CBC
HCT: 34.9 % — ABNORMAL LOW (ref 39.0–52.0)
HCT: 35 % — ABNORMAL LOW (ref 39.0–52.0)
Hemoglobin: 11.3 g/dL — ABNORMAL LOW (ref 13.0–17.0)
Hemoglobin: 11.5 g/dL — ABNORMAL LOW (ref 13.0–17.0)
MCH: 31.5 pg (ref 26.0–34.0)
MCH: 32 pg (ref 26.0–34.0)
MCHC: 32.4 g/dL (ref 30.0–36.0)
MCHC: 32.9 g/dL (ref 30.0–36.0)
MCV: 97.2 fL (ref 78.0–100.0)
MCV: 97.5 fL (ref 78.0–100.0)
Platelets: 173 10*3/uL (ref 150–400)
Platelets: 157 10*3/uL (ref 150–400)
RBC: 3.59 MIL/uL — ABNORMAL LOW (ref 4.22–5.81)
RBC: 3.59 MIL/uL — ABNORMAL LOW (ref 4.22–5.81)
RDW: 13.4 % (ref 11.5–15.5)
RDW: 13.3 % (ref 11.5–15.5)
WBC: 7.2 10*3/uL (ref 4.0–10.5)
WBC: 6.9 10*3/uL (ref 4.0–10.5)

## 2017-11-22 LAB — URINALYSIS, ROUTINE W REFLEX MICROSCOPIC
Bilirubin Urine: NEGATIVE
Glucose, UA: NEGATIVE mg/dL
Hgb urine dipstick: NEGATIVE
Ketones, ur: NEGATIVE mg/dL
Leukocytes, UA: NEGATIVE
Nitrite: NEGATIVE
Protein, ur: NEGATIVE mg/dL
Specific Gravity, Urine: 1.013 (ref 1.005–1.030)
pH: 6 (ref 5.0–8.0)

## 2017-11-22 LAB — COMPREHENSIVE METABOLIC PANEL
ALT: 18 U/L (ref 17–63)
AST: 27 U/L (ref 15–41)
Albumin: 4.1 g/dL (ref 3.5–5.0)
Alkaline Phosphatase: 77 U/L (ref 38–126)
Anion gap: 6 (ref 5–15)
BUN: 32 mg/dL — ABNORMAL HIGH (ref 6–20)
CO2: 24 mmol/L (ref 22–32)
Calcium: 9 mg/dL (ref 8.9–10.3)
Chloride: 109 mmol/L (ref 101–111)
Creatinine, Ser: 1.59 mg/dL — ABNORMAL HIGH (ref 0.61–1.24)
GFR calc Af Amer: 41 mL/min — ABNORMAL LOW (ref >60)
GFR calc non Af Amer: 35 mL/min — ABNORMAL LOW (ref >60)
Glucose, Bld: 108 mg/dL — ABNORMAL HIGH (ref 65–99)
Potassium: 4.3 mmol/L (ref 3.5–5.1)
Sodium: 139 mmol/L (ref 135–145)
Total Bilirubin: 0.5 mg/dL (ref 0.3–1.2)
Total Protein: 6.5 g/dL (ref 6.5–8.1)

## 2017-11-22 LAB — LIPID PANEL
Cholesterol: 160 mg/dL (ref 0–200)
HDL: 66 mg/dL (ref >40)
LDL Cholesterol: 72 mg/dL (ref 0–99)
Total CHOL/HDL Ratio: 2.4 RATIO
Triglycerides: 111 mg/dL (ref <150)
VLDL: 22 mg/dL (ref 0–40)

## 2017-11-22 LAB — DIFFERENTIAL
Basophils Absolute: 0 10*3/uL (ref 0.0–0.1)
Basophils Relative: 0 %
Eosinophils Absolute: 0.1 10*3/uL (ref 0.0–0.7)
Eosinophils Relative: 1 %
Lymphocytes Relative: 12 %
Lymphs Abs: 0.9 10*3/uL (ref 0.7–4.0)
Monocytes Absolute: 0.6 10*3/uL (ref 0.1–1.0)
Monocytes Relative: 9 %
Neutro Abs: 5.7 10*3/uL (ref 1.7–7.7)
Neutrophils Relative %: 78 %

## 2017-11-22 LAB — CREATININE, SERUM
Creatinine, Ser: 1.68 mg/dL — ABNORMAL HIGH (ref 0.61–1.24)
GFR calc Af Amer: 38 mL/min — ABNORMAL LOW (ref >60)
GFR calc non Af Amer: 33 mL/min — ABNORMAL LOW (ref >60)

## 2017-11-22 LAB — MAGNESIUM: Magnesium: 2 mg/dL (ref 1.7–2.4)

## 2017-11-22 LAB — I-STAT TROPONIN, ED: Troponin i, poc: 0.01 ng/mL (ref 0.00–0.08)

## 2017-11-22 LAB — RAPID URINE DRUG SCREEN, HOSP PERFORMED
Amphetamines: NOT DETECTED
Barbiturates: NOT DETECTED
Benzodiazepines: NOT DETECTED
Cocaine: NOT DETECTED
Opiates: NOT DETECTED
Tetrahydrocannabinol: NOT DETECTED

## 2017-11-22 LAB — PHOSPHORUS: Phosphorus: 3.2 mg/dL (ref 2.5–4.6)

## 2017-11-22 LAB — TSH: TSH: 0.949 u[IU]/mL (ref 0.350–4.500)

## 2017-11-22 LAB — ETHANOL: Alcohol, Ethyl (B): <10 mg/dL (ref <10)

## 2017-11-22 MED ORDER — PRAVASTATIN SODIUM 20 MG PO TABS
20.0000 mg | ORAL_TABLET | Freq: Every day | ORAL | Status: DC
  Administered 2017-11-22 – 2017-11-23 (×2): 20 mg via ORAL
  Filled 2017-11-22 (×2): qty 1

## 2017-11-22 MED ORDER — TAMSULOSIN HCL 0.4 MG PO CAPS
0.4000 mg | ORAL_CAPSULE | Freq: Every day | ORAL | Status: DC
  Administered 2017-11-22 – 2017-11-23 (×2): 0.4 mg via ORAL
  Filled 2017-11-22 (×2): qty 1

## 2017-11-22 MED ORDER — LOSARTAN POTASSIUM 25 MG PO TABS
25.0000 mg | ORAL_TABLET | Freq: Every day | ORAL | Status: DC
  Administered 2017-11-22 – 2017-11-24 (×3): 25 mg via ORAL
  Filled 2017-11-22 (×3): qty 1

## 2017-11-22 MED ORDER — METOPROLOL TARTRATE 25 MG PO TABS
12.5000 mg | ORAL_TABLET | Freq: Two times a day (BID) | ORAL | Status: DC
  Administered 2017-11-22 – 2017-11-24 (×3): 12.5 mg via ORAL
  Filled 2017-11-22 (×3): qty 1

## 2017-11-22 MED ORDER — PANTOPRAZOLE SODIUM 40 MG PO TBEC
40.0000 mg | DELAYED_RELEASE_TABLET | Freq: Every day | ORAL | Status: DC
  Administered 2017-11-23 – 2017-11-24 (×2): 40 mg via ORAL
  Filled 2017-11-22 (×2): qty 1

## 2017-11-22 MED ORDER — MECLIZINE HCL 25 MG PO TABS
12.5000 mg | ORAL_TABLET | Freq: Three times a day (TID) | ORAL | Status: DC | PRN
  Administered 2017-11-24: 12.5 mg via ORAL
  Filled 2017-11-22: qty 1

## 2017-11-22 MED ORDER — ALPRAZOLAM 0.25 MG PO TABS
0.1250 mg | ORAL_TABLET | Freq: Two times a day (BID) | ORAL | Status: DC | PRN
  Administered 2017-11-23 (×2): 0.125 mg via ORAL
  Filled 2017-11-22 (×2): qty 1

## 2017-11-22 MED ORDER — POLYETHYLENE GLYCOL 3350 17 G PO PACK: 17.0000 g | PACK | ORAL | Status: DC

## 2017-11-22 MED ORDER — ASPIRIN 325 MG PO TABS
325.0000 mg | ORAL_TABLET | Freq: Every day | ORAL | Status: DC
  Administered 2017-11-23 – 2017-11-24 (×2): 325 mg via ORAL
  Filled 2017-11-22 (×2): qty 1

## 2017-11-22 MED ORDER — ENOXAPARIN SODIUM 30 MG/0.3ML ~~LOC~~ SOLN
30.0000 mg | SUBCUTANEOUS | Status: DC
  Filled 2017-11-22 (×2): qty 0.3

## 2017-11-22 NOTE — ED Provider Notes (Signed)
[facility] DEPT Provider Note   CSN: [id number] Arrival date & time: 11/22/17  1343     History   Chief Complaint Chief Complaint  Patient presents with  . Dizziness    HPI Larry Mcfarland is a 83 y.o. male.  The history is provided by the patient. No language interpreter was used.  Dizziness   Larry PICKERAL is a 83 y.o. male who presents to the Emergency Department complaining of dizziness.  He reports dizziness that began several hours ago (10am).  He was reading in his computer screen and he turned his head to the left began to feel sudden dizziness.  He describes it as an off-balance sensation.  Symptoms have been persistent for the last hour.  He has been unable to walk secondary to dizziness.  Overall his symptoms are improved and they are nearly resolved when at rest.  He denies any fevers, headache, chest pain, shortness of breath, nausea, vomiting, vision changes. Past Medical History:  Diagnosis Date  . Anginal pain ([place])   . Anxiety   . Bleeding from the nose 2009   cautery by Dr. Erik Obey  . BPH (benign prostatic hyperplasia)   . CAD (coronary artery disease)   . Cataract    right eye  . CKD (chronic kidney disease), stage III ([place])   . Distal radius fracture 2013   Dr. Fredna Dow  . Dysrhythmia    hx of atrial fib- 10 years ago   . GERD (gastroesophageal reflux disease)   . Gout   . H/O hiatal hernia   . Hyperlipemia   . Hypertension   . Impaired fasting glucose   . Inguinal hernia without mention of obstruction or gangrene, unilateral or unspecified, (not specified as recurrent)   . Lumbar radiculopathy   . Major depressive disorder, single episode, unspecified   . Nosebleed    most recent 05/24/13  to have  cauterized prior to surgery   . Osteoarthritis   . Osteopenia 2002  . Skin cancer of forehead    left (BCE)  . Vertigo 1999   negative ct scan head; rare recurrance, last in 2013  . Vision disturbance      Patient Active Problem List   Diagnosis Date Noted  . Vertigo 11/22/2017  . BPH without urinary obstruction 10/16/2014  . Hyperlipidemia 10/16/2014  . Arrhythmia 10/08/2014  . Gastrojejunostomy tube status ([place]) 10/05/2014  . Preop cardiovascular exam 09/25/2014  . HTN (hypertension) 09/25/2014  . PAF (paroxysmal atrial fibrillation) ([place]) 09/25/2014  . Hiatal hernia s/p lap Repair with G-tube Nov 2015 07/30/2014  . Right inguinal hernia 01/10/2013    Past Surgical History:  Procedure Laterality Date  . ESOPHAGOGASTRODUODENOSCOPY N/A 07/10/2014   Procedure: ESOPHAGOGASTRODUODENOSCOPY (EGD);  Surgeon: Garlan Fair, MD;  Location: [facility];  Service: Endoscopy;  Laterality: N/A;  . EYE SURGERY     right eye cataract surgery   . GASTROSTOMY N/A 10/03/2014   Procedure: GASTROSTOMY TUBE;  Surgeon: Fanny Skates, MD;  Location: [facility];  Service: General;  Laterality: N/A;  . HIATAL HERNIA REPAIR N/A 10/03/2014   Procedure: [place], POSSIBLE OPEN;  Surgeon: Fanny Skates, MD;  Location: [facility];  Service: General;  Laterality: N/A;  . INGUINAL HERNIA REPAIR Right 06/07/2013   Procedure: LAPAROSCOPIC INGUINAL HERNIA REPAIR;  Surgeon: Adin Hector, MD;  Location: [facility];  Service: General;  Laterality: Right;  With Mesh  . INSERTION OF MESH Right 06/07/2013   Procedure: INSERTION  OF MESH;  Surgeon: Adin Hector, MD;  Location: [facility];  Service: General;  Laterality: Right;  . TONSILLECTOMY     at age 24 yrs old       Home Medications    Prior to Admission medications   Medication Sig Start Date End Date Taking? Authorizing Provider  ALPRAZolam (XANAX) 0.25 MG tablet Take 0.125 mg by mouth 2 (two) times daily as needed for anxiety.    Yes [provider]  aspirin 81 MG tablet Take 81 mg by mouth daily.   Yes [provider]  losartan (COZAAR) 25 MG tablet Take 25 mg by mouth daily.   Yes [provider]   metoprolol tartrate (LOPRESSOR) 25 MG tablet Take 12.5 mg by mouth 2 (two) times daily.   Yes [provider]  omeprazole (PRILOSEC) 20 MG capsule Take 40 mg by mouth every morning.   Yes [provider]  polyethylene glycol (MIRALAX / GLYCOLAX) packet Take 17 g by mouth every other day.   Yes [provider]  pravastatin (PRAVACHOL) 20 MG tablet Take 20 mg by mouth at bedtime.   Yes [provider]  tamsulosin (FLOMAX) 0.4 MG CAPS capsule Take 0.4 mg by mouth daily after supper.   Yes [provider]    Family History Family History  Problem Relation Age of Onset  . Heart disease Mother   . Heart disease Father   . Cancer Sister        breast     Social History Social History   Tobacco Use  . Smoking status: Never Smoker  . Smokeless tobacco: Never Used  Substance Use Topics  . Alcohol use: Yes    Comment: occasional  . Drug use: No     Allergies   Penicillins   Review of Systems Review of Systems  Neurological: Positive for dizziness.  All other systems reviewed and are negative.    Physical Exam Updated Vital Signs BP (!) 168/87   Pulse (!) 58   Temp 98.3 F (36.8 C)   Resp (!) 25   SpO2 100%   Physical Exam  Constitutional: He is oriented to person, place, and time. He appears well-developed and well-nourished.  HENT:  Head: Normocephalic and atraumatic.  Left TM partially obscurred by cerumen  Neck:  Faint carotid bruits bilaterally  Cardiovascular: Normal rate and regular rhythm.  Murmur heard. Systolic ejection murmur  Pulmonary/Chest: Effort normal and breath sounds normal. No respiratory distress.  Abdominal: Soft. There is no tenderness. There is no rebound and no guarding.  Musculoskeletal: He exhibits no edema or tenderness.  Neurological: He is alert and oriented to person, place, and time. No cranial nerve deficit.  5 out of 5 strength in all 4 extremities with sensation to light touch intact  in all 4 extremities.  No ataxia on finger to nose bilaterally.  No pronator drift.  Visual fields grossly intact.  Skin: Skin is warm and dry.  Psychiatric: He has a normal mood and affect. His behavior is normal.  Nursing note and vitals reviewed.    ED Treatments / Results  Labs (all labs ordered are listed, but only abnormal results are displayed) Labs Reviewed  CBC - Abnormal; Notable for the following components:      Result Value   RBC 3.59 (*)    Hemoglobin 11.3 (*)    HCT 34.9 (*)    All other components within normal limits  COMPREHENSIVE METABOLIC PANEL - Abnormal; Notable for the  following components:   Glucose, Bld 108 (*)    BUN 32 (*)    Creatinine, Ser 1.59 (*)    GFR calc non Af Amer 35 (*)    GFR calc Af Amer 41 (*)    All other components within normal limits  ETHANOL  DIFFERENTIAL  RAPID URINE DRUG SCREEN, HOSP PERFORMED  URINALYSIS, ROUTINE W REFLEX MICROSCOPIC  LIPID PANEL  I-STAT TROPONIN, ED    EKG  EKG Interpretation  Date/Time:  Monday November 22 2017 15:19:43 EST Ventricular Rate:  56 PR Interval:    QRS Duration: 86 QT Interval:  418 QTC Calculation: 404 R Axis:   39 Text Interpretation:  Sinus arrhythmia Minimal ST depression, inferior leads Confirmed by Quintella Reichert [phone]) on 11/22/2017 3:25:10 PM       Radiology Ct Head Wo Contrast  Result Date: 11/22/2017 CLINICAL DATA:  Dizziness EXAM: CT HEAD WITHOUT CONTRAST TECHNIQUE: Contiguous axial images were obtained from the base of the skull through the vertex without intravenous contrast. COMPARISON:  12/11/2009 FINDINGS: Brain: No acute territorial infarction, hemorrhage or new intracranial mass is visualized. Probable arachnoid in the left middle cranial fossa with mild mass effect on the temporal lobe, unchanged. Moderate atrophy. Mild small vessel ischemic changes of the white matter. Stable ventricle size. Vascular: No hyperdense vessels.  Carotid artery calcifications. Skull: No  fracture.  Mastoid air cells are clear. Sinuses/Orbits: No acute finding. Other: None IMPRESSION: No definite CT evidence for acute intracranial abnormality. Atrophy and mild small vessel ischemic changes of the white matter Electronically Signed   By: Donavan Foil M.D.   On: 11/22/2017 16:12    Procedures Procedures (including critical care time)  Medications Ordered in ED Medications - No data to display   Initial Impression / Assessment and Plan / ED Course  I have reviewed the triage vital signs and the nursing notes.  Pertinent labs & imaging results that were available during my care of the patient were reviewed by me and considered in my medical decision making (see chart for details).     Patient here for evaluation of dizziness.  He does not have any focal findings on neurologic examination but he does have ataxia on standing and is unable to attempt ambulation due to his ataxia with standing.  Labs are near his baseline.  Medicine consulted for further evaluation of vertigo/ataxia.  Final Clinical Impressions(s) / ED Diagnoses   Final diagnoses:  None    ED Discharge Orders    None       Quintella Reichert, MD 11/22/17 1819

## 2017-11-22 NOTE — ED Notes (Signed)
Bed: [facility] Expected date:  Expected time:  Means of arrival:  Comments: 83 yo dizziness

## 2017-11-22 NOTE — ED Notes (Signed)
Patient attempted to stand, but upon sitting, patient experienced severe dizziness. Patient attempted to stand, and was able to stand with assistance, but states he still feels "like the room is spinning." MD Ralene Bathe notified

## 2017-11-22 NOTE — ED Notes (Signed)
Bed: [facility] Expected date:  Expected time:  Means of arrival:  Comments: Larry Mcfarland

## 2017-11-22 NOTE — H&P (Signed)
History and Physical  Larry Mcfarland JYN:[id number] DOB: 05-May-1921 DOA: 11/22/2017  Referring physician: ER Physician PCP: Seward Carol, MD  Outpatient Specialists:    Patient coming from: Home  Chief Complaint: Dizziness/vertigo  HPI: Patient is a 83 year old Caucasian male with past medical history significant for Vertigo, impaired fasting glucose, CKD 3, HTN and HL. According to the patient, he developed dizziness and vertigo when he tried to get up after using the computer at home. The patient is still feeling dizzy. Patient describes dizziness as the head spinning. No motor deficits. CT Head is negative for any acute process. No headache, no neck pain, no fever or chills, no abdominal pain, no nausea or vomiting and no vomiting symptoms. Patient will be admitted for further assessment and management.  ED Course: CT head.  Pertinent labs: As above. EKG:  Imaging: independently reviewed.   Review of Systems:  As in HPI. 12 systems reviewed.  Past Medical History:  Diagnosis Date  . Anginal pain ([place])   . Anxiety   . Bleeding from the nose 2009   cautery by Dr. Erik Obey  . BPH (benign prostatic hyperplasia)   . CAD (coronary artery disease)   . Cataract    right eye  . CKD (chronic kidney disease), stage III ([place])   . Distal radius fracture 2013   Dr. Fredna Dow  . Dysrhythmia    hx of atrial fib- 10 years ago   . GERD (gastroesophageal reflux disease)   . Gout   . H/O hiatal hernia   . Hyperlipemia   . Hypertension   . Impaired fasting glucose   . Inguinal hernia without mention of obstruction or gangrene, unilateral or unspecified, (not specified as recurrent)   . Lumbar radiculopathy   . Major depressive disorder, single episode, unspecified   . Nosebleed    most recent 05/24/13  to have  cauterized prior to surgery   . Osteoarthritis   . Osteopenia 2002  . Skin cancer of forehead    left (BCE)  . Vertigo 1999   negative ct scan head; rare recurrance, last in  2013  . Vision disturbance     Past Surgical History:  Procedure Laterality Date  . ESOPHAGOGASTRODUODENOSCOPY N/A 07/10/2014   Procedure: ESOPHAGOGASTRODUODENOSCOPY (EGD);  Surgeon: Garlan Fair, MD;  Location: [facility];  Service: Endoscopy;  Laterality: N/A;  . EYE SURGERY     right eye cataract surgery   . GASTROSTOMY N/A 10/03/2014   Procedure: GASTROSTOMY TUBE;  Surgeon: Fanny Skates, MD;  Location: [facility];  Service: General;  Laterality: N/A;  . HIATAL HERNIA REPAIR N/A 10/03/2014   Procedure: [place], POSSIBLE OPEN;  Surgeon: Fanny Skates, MD;  Location: [facility];  Service: General;  Laterality: N/A;  . INGUINAL HERNIA REPAIR Right 06/07/2013   Procedure: LAPAROSCOPIC INGUINAL HERNIA REPAIR;  Surgeon: Adin Hector, MD;  Location: [facility];  Service: General;  Laterality: Right;  With Mesh  . INSERTION OF MESH Right 06/07/2013   Procedure: INSERTION OF MESH;  Surgeon: Adin Hector, MD;  Location: [facility];  Service: General;  Laterality: Right;  . TONSILLECTOMY     at age 46 yrs old     reports that  has never smoked. he has never used smokeless tobacco. He reports that he drinks alcohol. He reports that he does not use drugs.    Family History  Problem Relation Age of Onset  . Heart disease Mother   . Heart disease Father   .  Cancer Sister        breast      Prior to Admission medications   Medication Sig Start Date End Date Taking? Authorizing Provider  ALPRAZolam (XANAX) 0.25 MG tablet Take 0.125 mg by mouth 2 (two) times daily as needed for anxiety.    Yes [provider]  aspirin 81 MG tablet Take 81 mg by mouth daily.   Yes [provider]  losartan (COZAAR) 25 MG tablet Take 25 mg by mouth daily.   Yes [provider]  metoprolol tartrate (LOPRESSOR) 25 MG tablet Take 12.5 mg by mouth 2 (two) times daily.   Yes [provider]  omeprazole (PRILOSEC) 20 MG capsule Take 40 mg by mouth every  morning.   Yes [provider]  polyethylene glycol (MIRALAX / GLYCOLAX) packet Take 17 g by mouth every other day.   Yes [provider]  pravastatin (PRAVACHOL) 20 MG tablet Take 20 mg by mouth at bedtime.   Yes [provider]  tamsulosin (FLOMAX) 0.4 MG CAPS capsule Take 0.4 mg by mouth daily after supper.   Yes [provider]    Physical Exam: Vitals:   11/22/17 1647 11/22/17 1649 11/22/17 1655 11/22/17 1730  BP: (!) 135/97 (!) 149/78  105/84  Pulse: (!) 109 63  (!) 57  Resp: 15 18  11   Temp:   98.3 F (36.8 C)   TempSrc:      SpO2: 98% 100%  100%     Constitutional:  . Appears calm and comfortable Eyes:  . No pallor. No jaundice.  ENMT:  . external ears, nose appear normal Neck:  . Neck is supple. No JVD Respiratory:  . CTA bilaterally, no w/r/r.  . Respiratory effort normal. No retractions or accessory muscle use Cardiovascular:  . S1S2 . No LE extremity edema   Abdomen:  . Abdomen is soft and non tender. Organs are difficult to assess. Neurologic:  . Awake and alert. . Moves all limbs.  Wt Readings from Last 3 Encounters:  06/09/17 60.8 kg (134 lb)  02/03/17 64.4 kg (142 lb)  01/07/17 64.4 kg (142 lb)    I have personally reviewed following labs and imaging studies  Labs on Admission:  CBC: Recent Labs  Lab 11/22/17 1457  WBC 7.2  NEUTROABS 5.7  HGB 11.3*  HCT 34.9*  MCV 97.2  PLT 825   Basic Metabolic Panel: Recent Labs  Lab 11/22/17 1457  NA 139  K 4.3  CL 109  CO2 24  GLUCOSE 108*  BUN 32*  CREATININE 1.59*  CALCIUM 9.0   Liver Function Tests: Recent Labs  Lab 11/22/17 1457  AST 27  ALT 18  ALKPHOS 77  BILITOT 0.5  PROT 6.5  ALBUMIN 4.1   No results for input(s): LIPASE, AMYLASE in the last 168 hours. No results for input(s): AMMONIA in the last 168 hours. Coagulation Profile: No results for input(s): INR, PROTIME in the last 168 hours. Cardiac Enzymes: No results for input(s):  CKTOTAL, CKMB, CKMBINDEX, TROPONINI in the last 168 hours. BNP (last 3 results) No results for input(s): PROBNP in the last 8760 hours. HbA1C: No results for input(s): HGBA1C in the last 72 hours. CBG: No results for input(s): GLUCAP in the last 168 hours. Lipid Profile: No results for input(s): CHOL, HDL, LDLCALC, TRIG, CHOLHDL, LDLDIRECT in the last 72 hours. Thyroid Function Tests: No results for input(s): TSH, T4TOTAL, FREET4, T3FREE, THYROIDAB in the last 72 hours. Anemia Panel: No results for input(s):  VITAMINB12, FOLATE, FERRITIN, TIBC, IRON, RETICCTPCT in the last 72 hours. Urine analysis:    Component Value Date/Time   COLORURINE YELLOW 11/22/2017 1648   APPEARANCEUR CLEAR 11/22/2017 1648   LABSPEC 1.013 11/22/2017 1648   PHURINE 6.0 11/22/2017 1648   GLUCOSEU NEGATIVE 11/22/2017 1648   HGBUR NEGATIVE 11/22/2017 1648   BILIRUBINUR NEGATIVE 11/22/2017 1648   KETONESUR NEGATIVE 11/22/2017 1648   PROTEINUR NEGATIVE 11/22/2017 1648   UROBILINOGEN 0.2 09/14/2015 1733   NITRITE NEGATIVE 11/22/2017 1648   LEUKOCYTESUR NEGATIVE 11/22/2017 1648   Sepsis Labs: @LABRCNTIP (procalcitonin:4,lacticidven:4) )No results found for this or any previous visit (from the past 240 hour(s)).    Radiological Exams on Admission: Ct Head Wo Contrast  Result Date: 11/22/2017 CLINICAL DATA:  Dizziness EXAM: CT HEAD WITHOUT CONTRAST TECHNIQUE: Contiguous axial images were obtained from the base of the skull through the vertex without intravenous contrast. COMPARISON:  12/11/2009 FINDINGS: Brain: No acute territorial infarction, hemorrhage or new intracranial mass is visualized. Probable arachnoid in the left middle cranial fossa with mild mass effect on the temporal lobe, unchanged. Moderate atrophy. Mild small vessel ischemic changes of the white matter. Stable ventricle size. Vascular: No hyperdense vessels.  Carotid artery calcifications. Skull: No fracture.  Mastoid air cells are clear.  Sinuses/Orbits: No acute finding. Other: None IMPRESSION: No definite CT evidence for acute intracranial abnormality. Atrophy and mild small vessel ischemic changes of the white matter Electronically Signed   By: Donavan Foil M.D.   On: 11/22/2017 16:12    Active Problems:   Vertigo   Assessment/Plan 1. Dizziness/Vertigo 2. Rule out TIA 3. Hypertension   Admit patient  MRI brain  Aspirin  Meclizine PRN  Lipid profile  PT/OT consult  Low threshold to consult Neurolog  DVT prophylaxis:Lovenox Code Status: Full Family Communication:  Disposition Plan: Depends on hospital course   Consults called: Low threshold to consult Neurology   Admission status: Observation    Time spent: 60 minutes  Dana Allan, MD  [facility] Pager #: [phone] 7PM-7AM contact night coverage as above   11/22/2017, 5:44 PM

## 2017-11-22 NOTE — ED Triage Notes (Signed)
Patient BIB EMS from [facility]. Patient reports getting up out of his chair this morning and had a sudden onset of dizziness. Patient sat back down and the dizziness continued. Patient denies pain/discomfort. Patient denies SOB. Patient denies recent falls/illness. CBG= 155. Patient AxOx4 in triage- denies dizziness at this time.

## 2017-11-22 NOTE — ED Notes (Signed)
ED TO INPATIENT HANDOFF REPORT  Name/Age/Gender Larry Mcfarland 83 y.o. male  Code Status Code Status History    Date Active Date Inactive Code Status Order ID Comments User Context   10/03/2014 12:38 10/09/2014 16:03 Full Code [id number]  Fanny Skates, MD Inpatient   06/07/2013 10:53 06/09/2013 12:38 Full Code [id number]  Adin Hector, MD Inpatient      Home/SNF/Other Nursing Home  Chief Complaint Dizziness  Level of Care/Admitting Diagnosis ED Disposition    ED Disposition Condition [place]: [facility] [540086]  Level of Care: Telemetry [5]  Admit to tele based on following criteria: Other see comments  Comments: Vertigo  Diagnosis: Vertigo [761950]  Admitting Physician: Bonnell Public [[id number]]  Attending Physician: Dana Allan I [[id number]]  PT Class (Do Not Modify): Observation [104]  PT Acc Code (Do Not Modify): Observation [10022]       Medical History Past Medical History:  Diagnosis Date  . Anginal pain ([place])   . Anxiety   . Bleeding from the nose 2009   cautery by Dr. Erik Obey  . BPH (benign prostatic hyperplasia)   . CAD (coronary artery disease)   . Cataract    right eye  . CKD (chronic kidney disease), stage III ([place])   . Distal radius fracture 2013   Dr. Fredna Dow  . Dysrhythmia    hx of atrial fib- 10 years ago   . GERD (gastroesophageal reflux disease)   . Gout   . H/O hiatal hernia   . Hyperlipemia   . Hypertension   . Impaired fasting glucose   . Inguinal hernia without mention of obstruction or gangrene, unilateral or unspecified, (not specified as recurrent)   . Lumbar radiculopathy   . Major depressive disorder, single episode, unspecified   . Nosebleed    most recent 05/24/13  to have  cauterized prior to surgery   . Osteoarthritis   . Osteopenia 2002  . Skin cancer of forehead    left (BCE)  . Vertigo 1999   negative ct scan head; rare recurrance, last in 2013  . Vision disturbance      Allergies  PCN  IV Location/Drains/Wounds Patient Lines/Drains/Airways Status   Active Line/Drains/Airways    Name:   Placement date:   Placement time:   Site:   Days:   Peripheral IV Left Antecubital   -    -    Antecubital      Gastrostomy/Enterostomy Gastrostomy 22 Fr. LUQ   10/03/14    1020    LUQ   1146   Incision (Closed) 10/03/14 Abdomen Other (Comment)   10/03/14    1105     1146   Incision - 4 Ports Abdomen Anterior;Mid;Upper Right;Upper Right;Lateral Left;Lower   10/03/14    1049     1146   Incision - 1 Port Abdomen Right;Medial   10/03/14    1059     1146          Labs/Imaging Results for orders placed or performed during the hospital encounter of 11/22/17 (from the past 48 hour(s))  CBC     Status: Abnormal   Collection Time: 11/22/17  2:57 PM  Result Value Ref Range   WBC 7.2 4.0 - 10.5 K/uL   RBC 3.59 (L) 4.22 - 5.81 MIL/uL   Hemoglobin 11.3 (L) 13.0 - 17.0 g/dL   HCT 34.9 (L) 39.0 - 52.0 %   MCV 97.2 78.0 - 100.0 fL   MCH  31.5 26.0 - 34.0 pg   MCHC 32.4 30.0 - 36.0 g/dL   RDW 13.4 11.5 - 15.5 %   Platelets 173 150 - 400 K/uL  Differential     Status: None   Collection Time: 11/22/17  2:57 PM  Result Value Ref Range   Neutrophils Relative % 78 %   Neutro Abs 5.7 1.7 - 7.7 K/uL   Lymphocytes Relative 12 %   Lymphs Abs 0.9 0.7 - 4.0 K/uL   Monocytes Relative 9 %   Monocytes Absolute 0.6 0.1 - 1.0 K/uL   Eosinophils Relative 1 %   Eosinophils Absolute 0.1 0.0 - 0.7 K/uL   Basophils Relative 0 %   Basophils Absolute 0.0 0.0 - 0.1 K/uL  Comprehensive metabolic panel     Status: Abnormal   Collection Time: 11/22/17  2:57 PM  Result Value Ref Range   Sodium 139 135 - 145 mmol/L   Potassium 4.3 3.5 - 5.1 mmol/L   Chloride 109 101 - 111 mmol/L   CO2 24 22 - 32 mmol/L   Glucose, Bld 108 (H) 65 - 99 mg/dL   BUN 32 (H) 6 - 20 mg/dL   Creatinine, Ser 1.59 (H) 0.61 - 1.24 mg/dL   Calcium 9.0 8.9 - 10.3 mg/dL   Total Protein 6.5 6.5 - 8.1 g/dL   Albumin  4.1 3.5 - 5.0 g/dL   AST 27 15 - 41 U/L   ALT 18 17 - 63 U/L   Alkaline Phosphatase 77 38 - 126 U/L   Total Bilirubin 0.5 0.3 - 1.2 mg/dL   GFR calc non Af Amer 35 (L) >60 mL/min   GFR calc Af Amer 41 (L) >60 mL/min    Comment: (NOTE) The eGFR has been calculated using the CKD EPI equation. This calculation has not been validated in all clinical situations. eGFR's persistently <60 mL/min signify possible Chronic Kidney Disease.    Anion gap 6 5 - 15  I-stat troponin, ED     Status: None   Collection Time: 11/22/17  3:33 PM  Result Value Ref Range   Troponin i, poc 0.01 0.00 - 0.08 ng/mL   Comment 3            Comment: Due to the release kinetics of cTnI, a negative result within the first hours of the onset of symptoms does not rule out myocardial infarction with certainty. If myocardial infarction is still suspected, repeat the test at appropriate intervals.   Ethanol     Status: None   Collection Time: 11/22/17  3:38 PM  Result Value Ref Range   Alcohol, Ethyl (B) <10 <10 mg/dL    Comment:        LOWEST DETECTABLE LIMIT FOR SERUM ALCOHOL IS 10 mg/dL FOR MEDICAL PURPOSES ONLY   Urine rapid drug screen (hosp performed)     Status: None   Collection Time: 11/22/17  4:48 PM  Result Value Ref Range   Opiates NONE DETECTED NONE DETECTED   Cocaine NONE DETECTED NONE DETECTED   Benzodiazepines NONE DETECTED NONE DETECTED   Amphetamines NONE DETECTED NONE DETECTED   Tetrahydrocannabinol NONE DETECTED NONE DETECTED   Barbiturates NONE DETECTED NONE DETECTED    Comment: (NOTE) DRUG SCREEN FOR MEDICAL PURPOSES ONLY.  IF CONFIRMATION IS NEEDED FOR ANY PURPOSE, NOTIFY LAB WITHIN 5 DAYS. LOWEST DETECTABLE LIMITS FOR URINE DRUG SCREEN Drug Class                     Cutoff (ng/mL)  Amphetamine and metabolites    1000 Barbiturate and metabolites    200 Benzodiazepine                 163 Tricyclics and metabolites     300 Opiates and metabolites        300 Cocaine and  metabolites        300 THC                            50   Urinalysis, Routine w reflex microscopic     Status: None   Collection Time: 11/22/17  4:48 PM  Result Value Ref Range   Color, Urine YELLOW YELLOW   APPearance CLEAR CLEAR   Specific Gravity, Urine 1.013 1.005 - 1.030   pH 6.0 5.0 - 8.0   Glucose, UA NEGATIVE NEGATIVE mg/dL   Hgb urine dipstick NEGATIVE NEGATIVE   Bilirubin Urine NEGATIVE NEGATIVE   Ketones, ur NEGATIVE NEGATIVE mg/dL   Protein, ur NEGATIVE NEGATIVE mg/dL   Nitrite NEGATIVE NEGATIVE   Leukocytes, UA NEGATIVE NEGATIVE   Ct Head Wo Contrast  Result Date: 11/22/2017 CLINICAL DATA:  Dizziness EXAM: CT HEAD WITHOUT CONTRAST TECHNIQUE: Contiguous axial images were obtained from the base of the skull through the vertex without intravenous contrast. COMPARISON:  12/11/2009 FINDINGS: Brain: No acute territorial infarction, hemorrhage or new intracranial mass is visualized. Probable arachnoid in the left middle cranial fossa with mild mass effect on the temporal lobe, unchanged. Moderate atrophy. Mild small vessel ischemic changes of the white matter. Stable ventricle size. Vascular: No hyperdense vessels.  Carotid artery calcifications. Skull: No fracture.  Mastoid air cells are clear. Sinuses/Orbits: No acute finding. Other: None IMPRESSION: No definite CT evidence for acute intracranial abnormality. Atrophy and mild small vessel ischemic changes of the white matter Electronically Signed   By: Donavan Foil M.D.   On: 11/22/2017 16:12    Pending Labs Unresulted Labs (From admission, onward)   Start     Ordered   11/22/17 1756  Lipid panel  Add-on,   R     11/22/17 1755   Signed and Held  CBC  (enoxaparin (LOVENOX)    CrCl < 30 ml/min)  Once,   R    Comments:  Baseline for enoxaparin therapy IF NOT ALREADY DRAWN.  Notify MD if PLT < 100 K.    Signed and Held   Signed and Held  Creatinine, serum  (enoxaparin (LOVENOX)    CrCl < 30 ml/min)  Once,   R    Comments:   Baseline for enoxaparin therapy IF NOT ALREADY DRAWN.    Signed and Held   Signed and Held  Creatinine, serum  (enoxaparin (LOVENOX)    CrCl < 30 ml/min)  Weekly,   R    Comments:  while on enoxaparin therapy.    Signed and Held   Signed and Held  Magnesium  Once,   R     Signed and Held   Signed and Held  Phosphorus  Once,   R     Signed and Held   Signed and Held  TSH  Once,   R     Signed and Held   Signed and Held  Basic metabolic panel  Tomorrow morning,   R     Signed and Held   Signed and Held  CBC  Tomorrow morning,   R     Signed and Held  Vitals/Pain Today's Vitals   11/22/17 1649 11/22/17 1655 11/22/17 1730 11/22/17 1800  BP: (!) 149/78  105/84 (!) 168/87  Pulse: 63  (!) 57 (!) 58  Resp: 18  11 (!) 25  Temp:  98.3 F (36.8 C)    TempSrc:      SpO2: 100%  100% 100%    Isolation Precautions No active isolations  Medications Medications - No data to display  Mobility walks with device

## 2017-11-23 ENCOUNTER — Encounter (HOSPITAL_COMMUNITY): Payer: Medicare Other

## 2017-11-23 ENCOUNTER — Observation Stay (HOSPITAL_COMMUNITY): Payer: Medicare Other

## 2017-11-23 DIAGNOSIS — N183 Chronic kidney disease, stage 3 unspecified: Secondary | ICD-10-CM

## 2017-11-23 DIAGNOSIS — R42 Dizziness and giddiness: Secondary | ICD-10-CM

## 2017-11-23 DIAGNOSIS — Z7982 Long term (current) use of aspirin: Secondary | ICD-10-CM | POA: Diagnosis not present

## 2017-11-23 DIAGNOSIS — K219 Gastro-esophageal reflux disease without esophagitis: Secondary | ICD-10-CM | POA: Diagnosis present

## 2017-11-23 DIAGNOSIS — Z79899 Other long term (current) drug therapy: Secondary | ICD-10-CM | POA: Diagnosis not present

## 2017-11-23 DIAGNOSIS — I2510 Atherosclerotic heart disease of native coronary artery without angina pectoris: Secondary | ICD-10-CM | POA: Diagnosis present

## 2017-11-23 DIAGNOSIS — Z85828 Personal history of other malignant neoplasm of skin: Secondary | ICD-10-CM | POA: Diagnosis not present

## 2017-11-23 DIAGNOSIS — Z8249 Family history of ischemic heart disease and other diseases of the circulatory system: Secondary | ICD-10-CM | POA: Diagnosis not present

## 2017-11-23 DIAGNOSIS — I129 Hypertensive chronic kidney disease with stage 1 through stage 4 chronic kidney disease, or unspecified chronic kidney disease: Secondary | ICD-10-CM | POA: Diagnosis present

## 2017-11-23 DIAGNOSIS — I10 Essential (primary) hypertension: Secondary | ICD-10-CM | POA: Diagnosis not present

## 2017-11-23 DIAGNOSIS — Z880 Allergy status to penicillin: Secondary | ICD-10-CM | POA: Diagnosis not present

## 2017-11-23 DIAGNOSIS — E785 Hyperlipidemia, unspecified: Secondary | ICD-10-CM | POA: Diagnosis present

## 2017-11-23 DIAGNOSIS — M1990 Unspecified osteoarthritis, unspecified site: Secondary | ICD-10-CM | POA: Diagnosis present

## 2017-11-23 LAB — BASIC METABOLIC PANEL
Anion gap: 5 (ref 5–15)
BUN: 30 mg/dL — ABNORMAL HIGH (ref 6–20)
CO2: 25 mmol/L (ref 22–32)
Calcium: 9.2 mg/dL (ref 8.9–10.3)
Chloride: 109 mmol/L (ref 101–111)
Creatinine, Ser: 1.47 mg/dL — ABNORMAL HIGH (ref 0.61–1.24)
GFR calc Af Amer: 45 mL/min — ABNORMAL LOW (ref >60)
GFR calc non Af Amer: 39 mL/min — ABNORMAL LOW (ref >60)
Glucose, Bld: 99 mg/dL (ref 65–99)
Potassium: 3.9 mmol/L (ref 3.5–5.1)
Sodium: 139 mmol/L (ref 135–145)

## 2017-11-23 LAB — CBC
HCT: 34.3 % — ABNORMAL LOW (ref 39.0–52.0)
Hemoglobin: 11.2 g/dL — ABNORMAL LOW (ref 13.0–17.0)
MCH: 31.5 pg (ref 26.0–34.0)
MCHC: 32.7 g/dL (ref 30.0–36.0)
MCV: 96.6 fL (ref 78.0–100.0)
Platelets: 160 10*3/uL (ref 150–400)
RBC: 3.55 MIL/uL — ABNORMAL LOW (ref 4.22–5.81)
RDW: 13.2 % (ref 11.5–15.5)
WBC: 6.1 10*3/uL (ref 4.0–10.5)

## 2017-11-23 NOTE — Plan of Care (Signed)
VSS, HR down in the 50's at times.  Patient with no real complaints of dizziness this shift however has deferred to get up and sit in chair d/t fear that he will become dizzy.  Carotid dopplers performed, MRI of brain pending 11/24/17 (MD aware).  Friends at bedside.

## 2017-11-23 NOTE — Progress Notes (Signed)
PROGRESS NOTE    Larry Mcfarland  BDZ:[id number] DOB: 1921-03-19 DOA: 11/22/2017 PCP: Seward Carol, MD   Brief Narrative: 83 year old male with history of lentigo, chronic kidney disease stage III, hypertension, hyperlipidemia presented with dizziness.  CT head with no acute finding.  Admitted for further evaluation.  Assessment & Plan:   # Vertigo/ Dizziness rule out TIA: -CT scan with no acute finding.  Pending  MRI.  Patient is alert awake oriented and has no focal neurological deficit.  Continue aspirin, PT OT evaluation. -Follow-up carotid Doppler ultrasound -LDL 72, continue pravastatin continue supportive care.  #Hypertension: Continue losartan, metoprolol.  Monitor blood pressure.  #Chronic kidney disease stage III: Serum creatinine level around baseline.  Monitor BMP.   avoid nephrotoxins..  Continue home medication.  Continue supportive care, PT OT evaluation.  DVT prophylaxis: Lovenox subcutaneous Code Status: Full code Family Communication: No family at bedside Disposition Plan: Currently admitted    Consultants:   None  Procedures: MRI pending Antimicrobials: None  Subjective: Seen and examined at bedside.  Denies headache,  nausea vomiting chest pain shortness of breath.  He has not getting out of bed and does not know if he still has dizziness.  Objective: Vitals:   11/22/17 1956 11/22/17 1959 11/23/17 0457 11/23/17 0926  BP:  (!) 165/88 136/62 (!) 126/55  Pulse:  (!) 56 (!) 53 66  Resp:  20 20   Temp:  (!) 97.4 F (36.3 C) (!) 97.5 F (36.4 C)   TempSrc:  Oral Oral   SpO2:  99% 98% 96%  Weight: 60.5 kg (133 lb 6.1 oz)     Height: 5\' 4"  (1.626 m)       Intake/Output Summary (Last 24 hours) at 11/23/2017 1237 Last data filed at 11/23/2017 1048 Gross per 24 hour  Intake -  Output 1000 ml  Net -1000 ml   Filed Weights   11/22/17 1956  Weight: 60.5 kg (133 lb 6.1 oz)    Examination:  General exam: Appears calm and comfortable    Respiratory system: Clear to auscultation. Respiratory effort normal. No wheezing or crackle Cardiovascular system: S1 & S2 heard, RRR.  No pedal edema. Gastrointestinal system: Abdomen is nondistended, soft and nontender. Normal bowel sounds heard. Central nervous system: Alert and oriented. No focal neurological deficits. Extremities: Symmetric 5 x 5 power. Skin: No rashes, lesions or ulcers Psychiatry: Judgement and insight appear normal. Mood & affect appropriate.     Data Reviewed: I have personally reviewed following labs and imaging studies  CBC: Recent Labs  Lab 11/22/17 1457 11/22/17 1951 11/23/17 0505  WBC 7.2 6.9 6.1  NEUTROABS 5.7  --   --   HGB 11.3* 11.5* 11.2*  HCT 34.9* 35.0* 34.3*  MCV 97.2 97.5 96.6  PLT 173 157 341   Basic Metabolic Panel: Recent Labs  Lab 11/22/17 1457 11/22/17 1951 11/23/17 0505  NA 139  --  139  K 4.3  --  3.9  CL 109  --  109  CO2 24  --  25  GLUCOSE 108*  --  99  BUN 32*  --  30*  CREATININE 1.59* 1.68* 1.47*  CALCIUM 9.0  --  9.2  MG  --  2.0  --   PHOS  --  3.2  --    GFR: Estimated Creatinine Clearance: 25.7 mL/min (A) (by C-G formula based on SCr of 1.47 mg/dL (H)). Liver Function Tests: Recent Labs  Lab 11/22/17 1457  AST 27  ALT 18  ALKPHOS  77  BILITOT 0.5  PROT 6.5  ALBUMIN 4.1   No results for input(s): LIPASE, AMYLASE in the last 168 hours. No results for input(s): AMMONIA in the last 168 hours. Coagulation Profile: No results for input(s): INR, PROTIME in the last 168 hours. Cardiac Enzymes: No results for input(s): CKTOTAL, CKMB, CKMBINDEX, TROPONINI in the last 168 hours. BNP (last 3 results) No results for input(s): PROBNP in the last 8760 hours. HbA1C: No results for input(s): HGBA1C in the last 72 hours. CBG: No results for input(s): GLUCAP in the last 168 hours. Lipid Profile: Recent Labs    11/22/17 1457  CHOL 160  HDL 66  LDLCALC 72  TRIG 111  CHOLHDL 2.4   Thyroid Function  Tests: Recent Labs    11/22/17 1951  TSH 0.949   Anemia Panel: No results for input(s): VITAMINB12, FOLATE, FERRITIN, TIBC, IRON, RETICCTPCT in the last 72 hours. Sepsis Labs: No results for input(s): PROCALCITON, LATICACIDVEN in the last 168 hours.  No results found for this or any previous visit (from the past 240 hour(s)).       Radiology Studies: Ct Head Wo Contrast  Result Date: 11/22/2017 CLINICAL DATA:  Dizziness EXAM: CT HEAD WITHOUT CONTRAST TECHNIQUE: Contiguous axial images were obtained from the base of the skull through the vertex without intravenous contrast. COMPARISON:  12/11/2009 FINDINGS: Brain: No acute territorial infarction, hemorrhage or new intracranial mass is visualized. Probable arachnoid in the left middle cranial fossa with mild mass effect on the temporal lobe, unchanged. Moderate atrophy. Mild small vessel ischemic changes of the white matter. Stable ventricle size. Vascular: No hyperdense vessels.  Carotid artery calcifications. Skull: No fracture.  Mastoid air cells are clear. Sinuses/Orbits: No acute finding. Other: None IMPRESSION: No definite CT evidence for acute intracranial abnormality. Atrophy and mild small vessel ischemic changes of the white matter Electronically Signed   By: Donavan Foil M.D.   On: 11/22/2017 16:12        Scheduled Meds: . aspirin  325 mg Oral Daily  . enoxaparin (LOVENOX) injection  30 mg Subcutaneous Q24H  . losartan  25 mg Oral Daily  . metoprolol tartrate  12.5 mg Oral BID  . pantoprazole  40 mg Oral Daily  . polyethylene glycol  17 g Oral QODAY  . pravastatin  20 mg Oral QHS  . tamsulosin  0.4 mg Oral QPC supper   Continuous Infusions:   LOS: 0 days    Dron Tanna Furry, MD [facility] Pager [phone]  If 7PM-7AM, please contact night-coverage [email or web address] Password [facility] 11/23/2017, 12:37 PM

## 2017-11-23 NOTE — Progress Notes (Signed)
Carotid artery duplex has been completed. 1-39% ICA stenosis bilaterally.  11/23/17 11:30 AM Carlos Levering RVT

## 2017-11-23 NOTE — Progress Notes (Signed)
Patient up to chair with walker and one assist, patient does get dizzy and seems to "lean" more to right.  No facial droop, slurred speech, confusion, numbness in any body part reported by patient.  When assisted back to bed did not feel dizzy when walking but when stood still to use urinal patient complained of significant dizziness.  Son and daughter in law at bedside.

## 2017-11-23 NOTE — Progress Notes (Signed)
Spoke with radiology department regarding patients MRI of brain.  Per radiology staff if MRI of brain is not stat it will not be done today d/t the holiday.  Text page sent to primary MD regarding this.

## 2017-11-24 ENCOUNTER — Inpatient Hospital Stay (HOSPITAL_COMMUNITY): Payer: Medicare Other

## 2017-11-24 MED ORDER — MECLIZINE HCL 12.5 MG PO TABS: 12.5000 mg | ORAL_TABLET | Freq: Three times a day (TID) | ORAL | 0 refills | Status: DC | PRN

## 2017-11-24 NOTE — Discharge Summary (Signed)
Physician Discharge Summary  Larry Mcfarland XNT:[id number] DOB: Oct 18, 1921 DOA: 11/22/2017  PCP: Seward Carol, MD  Admit date: 11/22/2017 Discharge date: 11/24/2017  Admitted From:ALF Disposition:ALF  Recommendations for Outpatient Follow-up:  1. Follow up with PCP in 1-2 weeks 2. Please obtain BMP/CBC in one week 3. If you continue to have dizziness please follow-up with ENT.   Home Health: Yes Equipment/Devices: No Discharge Condition: Stable CODE STATUS: Full code Diet recommendation: Heart healthy  Brief/Interim Summary: 83 year old male with history of lentigo, chronic kidney disease stage III, hypertension, hyperlipidemia presented with dizziness.  CT head with no acute finding.  Admitted for further evaluation.  # Vertigo/ Dizziness rule out TIA: -CT scan and MRI of head with no acute finding.  Patient is on aspirin, statin.  Carotid Doppler unremarkable.  Patient symptoms improved and he is able to ambulate without dizziness.  He might have BPPV.  Started on meclizine as needed.  I have discussed with the patient's son to update the test result and discharge plan.  I recommended patient to follow-up with ENT if continued to have symptoms.  Patient will be discharged to assisted living facility with home care services.  Evaluated by PT and OT in the hospital.  -LDL 72, continue pravastatin continue supportive care.  #Hypertension: Continue losartan, metoprolol.  Monitor blood pressure.  #Chronic kidney disease stage III: Serum creatinine level around baseline.  Monitor BMP.   avoid nephrotoxins..  Patient is a stable to discharge from hospital.  He needs close monitoring and supportive care.   Discharge Diagnoses:  Active Problems:   Vertigo   CKD (chronic kidney disease), stage III [facility])    Discharge Instructions  Discharge Instructions    Ambulatory referral to ENT   Complete by:  As directed    Call MD for:  difficulty breathing, headache or visual  disturbances   Complete by:  As directed    Call MD for:  extreme fatigue   Complete by:  As directed    Call MD for:  hives   Complete by:  As directed    Call MD for:  persistant dizziness or light-headedness   Complete by:  As directed    Call MD for:  persistant nausea and vomiting   Complete by:  As directed    Call MD for:  severe uncontrolled pain   Complete by:  As directed    Call MD for:  temperature >100.4   Complete by:  As directed    Diet - low sodium heart healthy   Complete by:  As directed    Discharge instructions   Complete by:  As directed    If you continue to have dizziness please follow-up with ENT as outpatient.   Increase activity slowly   Complete by:  As directed      Allergies as of 11/24/2017      Reactions   Penicillins Nausea Only         Medication List    TAKE these medications   ALPRAZolam 0.25 MG tablet Commonly known as:  XANAX Take 0.125 mg by mouth 2 (two) times daily as needed for anxiety.   aspirin 81 MG tablet Take 81 mg by mouth daily.   losartan 25 MG tablet Commonly known as:  COZAAR Take 25 mg by mouth daily.   meclizine 12.5 MG tablet Commonly known as:  ANTIVERT Take 1 tablet (12.5 mg total) by mouth 3 (three) times daily as needed for dizziness (Vertigo).   metoprolol tartrate  25 MG tablet Commonly known as:  LOPRESSOR Take 12.5 mg by mouth 2 (two) times daily.   omeprazole 20 MG capsule Commonly known as:  PRILOSEC Take 40 mg by mouth every morning.   polyethylene glycol packet Commonly known as:  MIRALAX / GLYCOLAX Take 17 g by mouth every other day.   pravastatin 20 MG tablet Commonly known as:  PRAVACHOL Take 20 mg by mouth at bedtime.   tamsulosin 0.4 MG Caps capsule Commonly known as:  FLOMAX Take 0.4 mg by mouth daily after supper.      Follow-up Information    Seward Carol, MD. Schedule an appointment as soon as possible for a visit in 1 week(s).   Specialty:  Internal Medicine Contact  information: [address] [place] [address] [phone]          Allergies  Allergen Reactions  . Penicillins Nausea Only    Upset stomach Has patient had a PCN reaction causing immediate rash, facial/tongue/throat swelling, SOB or lightheadedness with hypotension: no Has patient had a PCN reaction causing severe rash involving mucus membranes or skin necrosis: no Has patient had a PCN reaction that required hospitalization no Has patient had a PCN reaction occurring within the last 10 years: no If all of the above answers are "NO", then may proceed with Cephalosporin use.     Consultations: None  Procedures/Studies: MRI  Subjective: Seen and examined at bedside.  Reported no further dizziness.  Able to ambulate.  Has weakness generally.  Denied nausea vomiting chest pain headache.  Discharge Exam: Vitals:   11/24/17 0429 11/24/17 0831  BP: 135/69 (!) 153/85  Pulse: (!) 57 62  Resp: 18   Temp: 97.9 F (36.6 C)   SpO2: 96%    Vitals:   11/23/17 1340 11/23/17 2109 11/24/17 0429 11/24/17 0831  BP: 125/64 (!) 143/81 135/69 (!) 153/85  Pulse: 66 61 (!) 57 62  Resp: 18 18 18    Temp: (!) 97.5 F (36.4 C) 98.4 F (36.9 C) 97.9 F (36.6 C)   TempSrc: Oral Oral Oral   SpO2: 97% 95% 96%   Weight:      Height:        General: Pt is alert, awake, not in acute distress Cardiovascular: RRR, S1/S2 +, no rubs, no gallops Respiratory: CTA bilaterally, no wheezing, no rhonchi Abdominal: Soft, NT, ND, bowel sounds + Extremities: no edema, no cyanosis    The results of significant diagnostics from this hospitalization (including imaging, microbiology, ancillary and laboratory) are listed below for reference.     Microbiology: No results found for this or any previous visit (from the past 240 hour(s)).   Labs: BNP (last 3 results) No results for input(s): BNP in the last 8760 hours. Basic Metabolic Panel: Recent Labs  Lab 11/22/17 1457  11/22/17 1951 11/23/17 0505  NA 139  --  139  K 4.3  --  3.9  CL 109  --  109  CO2 24  --  25  GLUCOSE 108*  --  99  BUN 32*  --  30*  CREATININE 1.59* 1.68* 1.47*  CALCIUM 9.0  --  9.2  MG  --  2.0  --   PHOS  --  3.2  --    Liver Function Tests: Recent Labs  Lab 11/22/17 1457  AST 27  ALT 18  ALKPHOS 77  BILITOT 0.5  PROT 6.5  ALBUMIN 4.1   No results for input(s): LIPASE, AMYLASE in the last 168  hours. No results for input(s): AMMONIA in the last 168 hours. CBC: Recent Labs  Lab 11/22/17 1457 11/22/17 1951 11/23/17 0505  WBC 7.2 6.9 6.1  NEUTROABS 5.7  --   --   HGB 11.3* 11.5* 11.2*  HCT 34.9* 35.0* 34.3*  MCV 97.2 97.5 96.6  PLT 173 157 160   Cardiac Enzymes: No results for input(s): CKTOTAL, CKMB, CKMBINDEX, TROPONINI in the last 168 hours. BNP: Invalid input(s): POCBNP CBG: No results for input(s): GLUCAP in the last 168 hours. D-Dimer No results for input(s): DDIMER in the last 72 hours. Hgb A1c No results for input(s): HGBA1C in the last 72 hours. Lipid Profile Recent Labs    11/22/17 1457  CHOL 160  HDL 66  LDLCALC 72  TRIG 111  CHOLHDL 2.4   Thyroid function studies Recent Labs    11/22/17 1951  TSH 0.949   Anemia work up No results for input(s): VITAMINB12, FOLATE, FERRITIN, TIBC, IRON, RETICCTPCT in the last 72 hours. Urinalysis    Component Value Date/Time   COLORURINE YELLOW 11/22/2017 1648   APPEARANCEUR CLEAR 11/22/2017 1648   LABSPEC 1.013 11/22/2017 1648   PHURINE 6.0 11/22/2017 1648   GLUCOSEU NEGATIVE 11/22/2017 1648   HGBUR NEGATIVE 11/22/2017 1648   BILIRUBINUR NEGATIVE 11/22/2017 1648   KETONESUR NEGATIVE 11/22/2017 1648   PROTEINUR NEGATIVE 11/22/2017 1648   UROBILINOGEN 0.2 09/14/2015 1733   NITRITE NEGATIVE 11/22/2017 1648   LEUKOCYTESUR NEGATIVE 11/22/2017 1648   Sepsis Labs Invalid input(s): PROCALCITONIN,  WBC,  LACTICIDVEN Microbiology No results found for this or any previous visit (from the past  240 hour(s)).   Time coordinating discharge: 27 minutes  SIGNED:   Rosita Fire, MD  [facility] 11/24/2017, 10:48 AM  If 7PM-7AM, please contact night-coverage [email or web address] Password [email or web address]

## 2017-11-24 NOTE — Evaluation (Addendum)
Physical Therapy Evaluation Patient Details Name: Larry Mcfarland MRN: [id number] DOB: 07/25/1921 Today's Date: 11/24/2017   History of Present Illness  pt was admitted for dizziness/vertigo.  PMH: HTN, CAD  Clinical Impression  Pt is modified independent with mobility, he ambulated 150' with RW with no loss of balance, he denied dizziness while walking, no dizziness with head turns. From PT standpoint, he is ready to DC home to his ALF. Rollator recommended.     Follow Up Recommendations HHPT   Equipment Recommendations  Other (comment)(rollator)    Recommendations for Other Services       Precautions / Restrictions Precautions Precautions: Fall Restrictions Weight Bearing Restrictions: No      Mobility  Bed Mobility Overal bed mobility: Modified Independent Bed Mobility: Rolling;Sidelying to Sit Rolling: Independent Sidelying to sit: Supervision       General bed mobility comments: used bedrail  Transfers Overall transfer level: Needs assistance Equipment used: Rolling walker (2 wheeled) Transfers: Sit to/from Stand Sit to Stand: Supervision         General transfer comment: VCs for hand placement  Ambulation/Gait Ambulation/Gait assistance: Modified independent (Device/Increase time) Ambulation Distance (Feet): 150 Feet Assistive device: Rolling walker (2 wheeled) Gait Pattern/deviations: WFL(Within Functional Limits)   Gait velocity interpretation: at or above normal speed for age/gender General Gait Details: steady with RW, pt denied dizziness, vital signs stable  Stairs            Wheelchair Mobility    Modified Rankin (Stroke Patients Only)       Balance                                             Pertinent Vitals/Pain Pain Assessment: No/denies pain    Home Living Family/patient expects to be discharged to:: Assisted living                 Additional Comments: [facility]    Prior Function Level of  Independence: Independent with assistive device(s)         Comments: uses stationary bike daily.  walks to dining room independently with cane     Hand Dominance        Extremity/Trunk Assessment   Upper Extremity Assessment Upper Extremity Assessment: Defer to OT evaluation    Lower Extremity Assessment Lower Extremity Assessment: Overall WFL for tasks assessed    Cervical / Trunk Assessment Cervical / Trunk Assessment: Kyphotic(mild kyphosis)  Communication   Communication: HOH  Cognition Arousal/Alertness: Awake/alert Behavior During Therapy: WFL for tasks assessed/performed Overall Cognitive Status: Within Functional Limits for tasks assessed                                        General Comments General comments (skin integrity, edema, etc.): pt negative for BPPV in all canals.  Retested VOR and HT with negative results. Pt still unsteady.     Exercises     Assessment/Plan    PT Assessment Patent does not need any further PT services  PT Problem List         PT Treatment Interventions      PT Goals (Current goals can be found in the Care Plan section)  Acute Rehab PT Goals Patient Stated Goal: return to PLOF PT Goal Formulation: All assessment and education  complete, DC therapy    Frequency     Barriers to discharge        Co-evaluation               AM-PAC PT "6 Clicks" Daily Activity  Outcome Measure Difficulty turning over in bed (including adjusting bedclothes, sheets and blankets)?: None Difficulty moving from lying on back to sitting on the side of the bed? : None Difficulty sitting down on and standing up from a chair with arms (e.g., wheelchair, bedside commode, etc,.)?: None Help needed moving to and from a bed to chair (including a wheelchair)?: None Help needed walking in hospital room?: None Help needed climbing 3-5 steps with a railing? : A Little 6 Click Score: 23    End of Session Equipment Utilized  During Treatment: Gait belt Activity Tolerance: Patient tolerated treatment well Patient left: in bed;with call bell/phone within reach Nurse Communication: Mobility status      Time: 3005-1102 PT Time Calculation (min) (ACUTE ONLY): 15 min   Charges:   PT Evaluation $PT Eval Low Complexity: 1 Low     PT G Codes:          Philomena Doheny 11/24/2017, 12:15 PM [phone]

## 2017-11-24 NOTE — Progress Notes (Signed)
   11/24/17 1011  OT Visit Information  Last OT Received On 11/24/17  Assistance Needed +1  History of Present Illness pt was admitted for dizziness/vertigo.  PMH:  vertigo, HTN, CAD  Precautions  Precautions Fall  Pain Assessment  Pain Assessment No/denies pain  Cognition  Arousal/Alertness Awake/alert  Behavior During Therapy WFL for tasks assessed/performed  Overall Cognitive Status Within Functional Limits for tasks assessed  Restrictions  Weight Bearing Restrictions No  Transfers  Equipment used None  Sit to Stand Min assist  General transfer comment much steadier with RW. Pt has a cane:  he will see if [facility] has a walker he can borrow  General Comments  General comments (skin integrity, edema, etc.) pt negative for BPPV in all canals.  Retested VOR and HT with negative results. Pt still unsteady.   OT - End of Session  Activity Tolerance Patient tolerated treatment well  Patient left in bed;with call bell/phone within reach  OT Assessment/Plan  OT Visit Diagnosis Unsteadiness on feet (R26.81)  OT Frequency (ACUTE ONLY) Min 2X/week  Follow Up Recommendations Home health OT (assistance for adls/mobility). Pt may hire assistance for am.  He just finished a month of therapy:  Recommend resumption to restore independent level  OT Equipment None recommended by OT  AM-PAC OT "6 Clicks" Daily Activity Outcome Measure  Help from another person eating meals? 4  Help from another person taking care of personal grooming? 3  Help from another person toileting, which includes using toliet, bedpan, or urinal? 3  Help from another person bathing (including washing, rinsing, drying)? 3  Help from another person to put on and taking off regular upper body clothing? 3  Help from another person to put on and taking off regular lower body clothing? 3  6 Click Score 19  ADL G Code Conversion CK  OT Goal Progression  Progress towards OT goals Progressing toward goals  OT Time Calculation   OT Start Time (ACUTE ONLY) 1010  OT Stop Time (ACUTE ONLY) 1023  OT Time Calculation (min) 13 min  OT General Charges  $OT Visit 1 Visit  OT Treatments  $Therapeutic Activity 8-22 mins  [place], OTR/L [phone] 11/24/2017

## 2017-11-24 NOTE — Progress Notes (Signed)
Discharge planning, spoke with patient and spouse at bedside. Have chosen [facility] for [facility]. Contacted [facility] for referral. Needs rolator, contacted [facility] to deliver to room. [phone]

## 2017-11-24 NOTE — Evaluation (Addendum)
Occupational Therapy Evaluation (and part of vestibular evaluation) Patient Details Name: Larry Mcfarland MRN: [id number] DOB: 27-Feb-1921 Today's Date: 11/24/2017    History of Present Illness pt was admitted for dizziness/vertigo.  PMH:  vertigo, HTN, CAD   Clinical Impression   This 83 year old man was admitted for the above.  At baseline, he lives in ALF and is independent, including riding stationary bike daily.  He currently needs min guard for adls.  Began vestibular evaluation:  Pt could not relax neck enough for VOR testing, so performed modified. Will return to check for BPPV and will reattempt VOR.  Pt did not feel spinning sensation during evaluation but his head felt different (floaty) and he pointed to ears.  Will return later today and follow him in acute setting    Follow Up Recommendations  SNF(vs assistance for mobility/adls)    Equipment Recommendations  None recommended by OT    Recommendations for Other Services       Precautions / Restrictions Precautions Precautions: Fall Restrictions Weight Bearing Restrictions: No      Mobility Bed Mobility Overal bed mobility: Needs Assistance Bed Mobility: Rolling;Sidelying to Sit Rolling: Independent Sidelying to sit: Supervision       General bed mobility comments: used bedrail  Transfers Overall transfer level: Needs assistance Equipment used: Rolling walker (2 wheeled) Transfers: Sit to/from Stand Sit to Stand: Min guard         General transfer comment: for safety; pt feels legs are weak    Balance                                           ADL either performed or assessed with clinical judgement   ADL Overall ADL's : Needs assistance/impaired Eating/Feeding: Independent   Grooming: Set up;Sitting   Upper Body Bathing: Set up;Sitting   Lower Body Bathing: Sit to/from stand;Min guard   Upper Body Dressing : Set up;Sitting   Lower Body Dressing: Min guard;Sit to/from  stand   Toilet Transfer: Min guard;Stand-pivot;RW(chair)   Toileting- Water quality scientist and Hygiene: Min guard;Sit to/from stand         General ADL Comments: pt feels like legs are weak:  a little unsteady and shaky after sitting down.       Vision         Perception     Praxis      Pertinent Vitals/Pain Pain Assessment: No/denies pain     Hand Dominance     Extremity/Trunk Assessment Upper Extremity Assessment Upper Extremity Assessment: Overall WFL for tasks assessed           Communication Communication Communication: HOH   Cognition Arousal/Alertness: Awake/alert Behavior During Therapy: WFL for tasks assessed/performed Overall Cognitive Status: Within Functional Limits for tasks assessed                                     General Comments  began vestibular evaluation:  pt initially felt dizzy when he was sitting and turned head to L at computer.  He states he really doesn't have spinning now but feels something in his head "floaty".  Tested for smooth pursuits, saccades, modified  VOR; negative.  Pt had difficulty relaxing to have head moved during VOR; I let him move it himself.  could not test HT because  of this.  Head shaking negative.  did not initially perform modified hallpike dix as he did not get dizzy moving head up and down. Will return later to check this.  Also, nursing performed orthostatics earlier and they were negative    Exercises     Shoulder Instructions      Home Living Family/patient expects to be discharged to:: unsure, from ALF                                        Prior Functioning/Environment          Comments: uses stationary bike daily.  independent, no AD        OT Problem List: Decreased strength;Decreased activity tolerance;Impaired balance (sitting and/or standing);Decreased knowledge of use of DME or AE      OT Treatment/Interventions: Self-care/ADL training;DME and/or AE  instruction;Patient/family education;Balance training    OT Goals(Current goals can be found in the care plan section) Acute Rehab OT Goals Patient Stated Goal: return to PLOF OT Goal Formulation: With patient ADL Goals Pt Will Transfer to Toilet: with modified independence;anterior/posterior transfer Additional ADL Goal #1: pt with gather clothes and perform ADL at supervision level  OT Frequency: Min 2X/week   Barriers to D/C:            Co-evaluation              AM-PAC PT "6 Clicks" Daily Activity     Outcome Measure Help from another person eating meals?: None Help from another person taking care of personal grooming?: A Little Help from another person toileting, which includes using toliet, bedpan, or urinal?: A Little Help from another person bathing (including washing, rinsing, drying)?: A Little Help from another person to put on and taking off regular upper body clothing?: A Little Help from another person to put on and taking off regular lower body clothing?: A Little 6 Click Score: 19   End of Session    Activity Tolerance: Patient tolerated treatment well Patient left: in chair;with call bell/phone within reach  OT Visit Diagnosis: Unsteadiness on feet (R26.81)                Time: 8638-1771 OT Time Calculation (min): 22 min Charges:  OT General Charges $OT Visit: 1 Visit OT Evaluation $OT Eval Low Complexity: 1 Low G-Codes:     [place], OTR/L [phone] 11/24/2017  Larry Mcfarland,Larry Mcfarland 11/24/2017, 9:29 AM

## 2017-11-26 DIAGNOSIS — R269 Unspecified abnormalities of gait and mobility: Secondary | ICD-10-CM | POA: Diagnosis not present

## 2017-11-26 DIAGNOSIS — F419 Anxiety disorder, unspecified: Secondary | ICD-10-CM | POA: Diagnosis not present

## 2017-11-26 DIAGNOSIS — N183 Chronic kidney disease, stage 3 (moderate): Secondary | ICD-10-CM | POA: Diagnosis not present

## 2017-11-26 DIAGNOSIS — R42 Dizziness and giddiness: Secondary | ICD-10-CM | POA: Diagnosis not present

## 2017-11-26 DIAGNOSIS — I2510 Atherosclerotic heart disease of native coronary artery without angina pectoris: Secondary | ICD-10-CM | POA: Diagnosis not present

## 2017-11-26 DIAGNOSIS — E7800 Pure hypercholesterolemia, unspecified: Secondary | ICD-10-CM | POA: Diagnosis not present

## 2017-11-26 DIAGNOSIS — I340 Nonrheumatic mitral (valve) insufficiency: Secondary | ICD-10-CM | POA: Diagnosis not present

## 2017-11-26 DIAGNOSIS — I10 Essential (primary) hypertension: Secondary | ICD-10-CM | POA: Diagnosis not present

## 2017-12-15 DIAGNOSIS — I700 Atherosclerosis of aorta: Secondary | ICD-10-CM | POA: Diagnosis not present

## 2017-12-15 DIAGNOSIS — Z0000 Encounter for general adult medical examination without abnormal findings: Secondary | ICD-10-CM | POA: Diagnosis not present

## 2017-12-15 DIAGNOSIS — I10 Essential (primary) hypertension: Secondary | ICD-10-CM | POA: Diagnosis not present

## 2017-12-15 DIAGNOSIS — I340 Nonrheumatic mitral (valve) insufficiency: Secondary | ICD-10-CM | POA: Diagnosis not present

## 2017-12-15 DIAGNOSIS — K5900 Constipation, unspecified: Secondary | ICD-10-CM | POA: Diagnosis not present

## 2017-12-15 DIAGNOSIS — Z1389 Encounter for screening for other disorder: Secondary | ICD-10-CM | POA: Diagnosis not present

## 2017-12-15 DIAGNOSIS — F419 Anxiety disorder, unspecified: Secondary | ICD-10-CM | POA: Diagnosis not present

## 2017-12-15 DIAGNOSIS — I2510 Atherosclerotic heart disease of native coronary artery without angina pectoris: Secondary | ICD-10-CM | POA: Diagnosis not present

## 2017-12-15 DIAGNOSIS — R269 Unspecified abnormalities of gait and mobility: Secondary | ICD-10-CM | POA: Diagnosis not present

## 2017-12-15 DIAGNOSIS — E7800 Pure hypercholesterolemia, unspecified: Secondary | ICD-10-CM | POA: Diagnosis not present

## 2017-12-15 DIAGNOSIS — J302 Other seasonal allergic rhinitis: Secondary | ICD-10-CM | POA: Diagnosis not present

## 2017-12-15 DIAGNOSIS — N183 Chronic kidney disease, stage 3 (moderate): Secondary | ICD-10-CM | POA: Diagnosis not present

## 2017-12-30 DIAGNOSIS — I2510 Atherosclerotic heart disease of native coronary artery without angina pectoris: Secondary | ICD-10-CM | POA: Diagnosis not present

## 2017-12-30 DIAGNOSIS — N183 Chronic kidney disease, stage 3 (moderate): Secondary | ICD-10-CM | POA: Diagnosis not present

## 2017-12-30 DIAGNOSIS — F329 Major depressive disorder, single episode, unspecified: Secondary | ICD-10-CM | POA: Diagnosis not present

## 2017-12-30 DIAGNOSIS — I10 Essential (primary) hypertension: Secondary | ICD-10-CM | POA: Diagnosis not present

## 2017-12-30 DIAGNOSIS — N400 Enlarged prostate without lower urinary tract symptoms: Secondary | ICD-10-CM | POA: Diagnosis not present

## 2017-12-30 DIAGNOSIS — E785 Hyperlipidemia, unspecified: Secondary | ICD-10-CM | POA: Diagnosis not present

## 2018-01-05 ENCOUNTER — Encounter: Payer: Self-pay | Admitting: Cardiovascular Disease

## 2018-01-05 ENCOUNTER — Ambulatory Visit (INDEPENDENT_AMBULATORY_CARE_PROVIDER_SITE_OTHER): Payer: Medicare Other | Admitting: Cardiovascular Disease

## 2018-01-05 VITALS — BP 118/71 | HR 60 | Ht 64.0 in | Wt 141.1 lb

## 2018-01-05 DIAGNOSIS — I340 Nonrheumatic mitral (valve) insufficiency: Secondary | ICD-10-CM | POA: Diagnosis not present

## 2018-01-05 NOTE — Progress Notes (Signed)
r  Chief Complaint  Patient presents with  . Follow-up    mitral regurgitation     History of Present Illness: 84 yo male with history of chronic kidney disease, paroxysmal atrial fibrillation, GERD, HTN, HLD and severe mitral regurgitation who is here today for follow up. He had been followed by Dr. Johnsie Cancel in our office. I saw him  in the valve clinic as a new patient in October 2016 to discuss his severe mitral regurgitation. 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 After a long discussion with the patient and his family, we elected for conservative management of his valve disease. He has remote atrial fibrillation but no recent recurrences. He has normal LV systolic function.    He is here today for follow up. The patient denies any chest pain, dyspnea, palpitations, lower extremity edema, orthopnea, PND, dizziness, near syncope or syncope.    Primary Care Physician: Seward Carol, MD   Past Medical History:  Diagnosis Date  . Anginal pain ([place])   . Anxiety   . Bleeding from the nose 2009   cautery by Dr. Erik Obey  . BPH (benign prostatic hyperplasia)   . CAD (coronary artery disease)   . Cataract    right eye  . CKD (chronic kidney disease), stage III ([place])   . Distal radius fracture 2013   Dr. Fredna Dow  . Dysrhythmia    hx of atrial fib- 10 years ago   . GERD (gastroesophageal reflux disease)   . Gout   . H/O hiatal hernia   . Hyperlipemia   . Hypertension   . Impaired fasting glucose   . Inguinal hernia without mention of obstruction or gangrene, unilateral or unspecified, (not specified as recurrent)   . Lumbar radiculopathy   . Major depressive disorder, single episode, unspecified   . Nosebleed    most recent 05/24/13  to have  cauterized prior to surgery   . Osteoarthritis   . Osteopenia 2002  . Skin cancer of forehead    left (BCE)  . Vertigo 1999   negative ct scan head; rare recurrance, last in 2013  . Vision  disturbance     Past Surgical History:  Procedure Laterality Date  . ESOPHAGOGASTRODUODENOSCOPY N/A 07/10/2014   Procedure: ESOPHAGOGASTRODUODENOSCOPY (EGD);  Surgeon: Garlan Fair, MD;  Location: [facility];  Service: Endoscopy;  Laterality: N/A;  . EYE SURGERY     right eye cataract surgery   . GASTROSTOMY N/A 10/03/2014   Procedure: GASTROSTOMY TUBE;  Surgeon: Fanny Skates, MD;  Location: [facility];  Service: General;  Laterality: N/A;  . HIATAL HERNIA REPAIR N/A 10/03/2014   Procedure: [place], POSSIBLE OPEN;  Surgeon: Fanny Skates, MD;  Location: [facility];  Service: General;  Laterality: N/A;  . INGUINAL HERNIA REPAIR Right 06/07/2013   Procedure: LAPAROSCOPIC INGUINAL HERNIA REPAIR;  Surgeon: Adin Hector, MD;  Location: [facility];  Service: General;  Laterality: Right;  With Mesh  . INSERTION OF MESH Right 06/07/2013   Procedure: INSERTION OF MESH;  Surgeon: Adin Hector, MD;  Location: [facility];  Service: General;  Laterality: Right;  . TONSILLECTOMY     at age 37 yrs old    Current Outpatient Medications  Medication Sig Dispense Refill  . ALPRAZolam (XANAX) 0.25 MG tablet Take 0.125 mg by mouth 2 (two) times daily as needed for anxiety.     Marland Kitchen aspirin 81 MG tablet Take 81 mg by mouth daily.    Marland Kitchen  losartan (COZAAR) 25 MG tablet Take 25 mg by mouth daily.    . meclizine (ANTIVERT) 12.5 MG tablet Take 1 tablet (12.5 mg total) by mouth 3 (three) times daily as needed for dizziness (Vertigo). 30 tablet 0  . metoprolol tartrate (LOPRESSOR) 25 MG tablet Take 12.5 mg by mouth 2 (two) times daily.    Marland Kitchen omeprazole (PRILOSEC) 20 MG capsule Take 40 mg by mouth every morning.    . polyethylene glycol (MIRALAX / GLYCOLAX) packet Take 17 g by mouth every other day.    . pravastatin (PRAVACHOL) 20 MG tablet Take 20 mg by mouth at bedtime.    . tamsulosin (FLOMAX) 0.4 MG CAPS capsule Take 0.4 mg by mouth daily after supper.     No current facility-administered  medications for this visit.     Allergies  Allergen Reactions  . Penicillins Nausea Only    Upset stomach .     Social History   Socioeconomic History  . Marital status: Widowed    Spouse name: Not on file  . Number of children: 2  . Years of education: Not on file  . Highest education level: Not on file  Social Needs  . Financial resource strain: Not on file  . Food insecurity - worry: Not on file  . Food insecurity - inability: Not on file  . Transportation needs - medical: Not on file  . Transportation needs - non-medical: Not on file  Occupational History  . 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 Smoking status: Never Smoker  . Smokeless tobacco: Never Used  Substance and Sexual Activity  . Alcohol use: Yes    Comment: occasional  . Drug use: No  . Sexual activity: Not on file  Other Topics Concern  . Not on file  Social History Narrative  . Not on file    Family History  Problem Relation Age of Onset  . Heart disease Mother   . Heart disease Father   . Cancer Sister        breast     Review of Systems:  As stated in the HPI and otherwise negative.   BP 118/71   Pulse 60   Ht 5\' 4"  (1.626 m)   Wt 141 lb 1.9 oz (64 kg)   SpO2 95%   BMI 24.22 kg/m   Physical Examination:  General: Well developed, well nourished, NAD  HEENT: OP clear, mucus membranes moist  SKIN: warm, dry. No rashes. Neuro: No focal deficits  Musculoskeletal: Muscle strength 5/5 all ext  Psychiatric: Mood and affect normal  Neck: No JVD, no carotid bruits, no thyromegaly, no lymphadenopathy.  Lungs:Clear bilaterally, no wheezes, rhonci, crackles Cardiovascular: Regular rate and rhythm. Loud systolic murmur.  Abdomen:Soft. Bowel sounds present. Non-tender.  Extremities: No lower extremity edema. Pulses are 2 + in the bilateral DP/PT.  Echo 08/24/16: Left ventricle: The cavity size was normal. There was moderate   concentric  hypertrophy. Systolic function was normal. The   estimated ejection fraction was in the range of 60% to 65%. Wall   motion was normal; there were no regional wall motion   abnormalities. Features are consistent with a pseudonormal left   ventricular filling pattern, with concomitant abnormal relaxation   and increased filling pressure (grade 2 diastolic dysfunction). - Aortic valve: There was trivial regurgitation. - Mitral valve: Probable, severe flail motion involving the lateral   scallop of the posterior leaflet due to rupture of one or  more   chords. There was severe regurgitation directed eccentrically and   toward the septum. Severe regurgitation is suggested by pulmonary   vein systolic flow reversal. - Left atrium: The atrium was moderately dilated. - Tricuspid valve: There was mild-moderate regurgitation directed   centrally. - Pulmonary arteries: Systolic pressure was moderately increased.   PA peak pressure: 56 mm Hg (S).  Recommendations:  Consider transesophageal echocardiography to better identify the anatomical abnormality leading to severe mitral insufficiency.  EKG:  EKG is not  ordered today. The ekg ordered today demonstrates    Recent Labs: 11/22/2017: ALT 18; Magnesium 2.0; TSH 0.949 11/23/2017: BUN 30; Creatinine, Ser 1.47; Hemoglobin 11.2; Platelets 160; Potassium 3.9; Sodium 139   Lipid Panel    Component Value Date/Time   CHOL 160 11/22/2017 1457   TRIG 111 11/22/2017 1457   HDL 66 11/22/2017 1457   CHOLHDL 2.4 11/22/2017 1457   VLDL 22 11/22/2017 1457   LDLCALC 72 11/22/2017 1457     Wt Readings from Last 3 Encounters:  01/05/18 141 lb 1.9 oz (64 kg)  11/22/17 133 lb 6.1 oz (60.5 kg)  06/09/17 134 lb (60.8 kg)     Other studies Reviewed: Additional studies/ records that were reviewed today include: . Review of the above records demonstrates:   Assessment and Plan:   1. Severe mitral regurgitation: He has severe MR that is felt to be due  to flail leaflet with a probable chord rupture. Continues to have fatigue but no dyspnea or LE edema. No plans for aggressive management. He is not a candidate for mitraclip procedure   Current medicines are reviewed at length with the patient today.  The patient does not have concerns regarding medicines.  The following changes have been made:  no change  Labs/ tests ordered today include:   No orders of the defined types were placed in this encounter.    Disposition:   FU with me in 6 months   Signed, Lauree Chandler, MD 01/05/2018 10:40 AM    [facility] [address], [place], [place]  [address] Phone: [phone]; Fax: [phone]

## 2018-01-05 NOTE — Patient Instructions (Signed)

## 2018-02-01 DIAGNOSIS — I10 Essential (primary) hypertension: Secondary | ICD-10-CM | POA: Diagnosis not present

## 2018-02-02 ENCOUNTER — Ambulatory Visit (INDEPENDENT_AMBULATORY_CARE_PROVIDER_SITE_OTHER): Payer: Medicare Other | Admitting: Podiatry

## 2018-02-02 ENCOUNTER — Encounter: Payer: Self-pay | Admitting: Podiatry

## 2018-02-02 DIAGNOSIS — M79609 Pain in unspecified limb: Secondary | ICD-10-CM

## 2018-02-02 DIAGNOSIS — M2030 Hallux varus (acquired), unspecified foot: Secondary | ICD-10-CM

## 2018-02-02 DIAGNOSIS — B351 Tinea unguium: Secondary | ICD-10-CM

## 2018-02-02 NOTE — Progress Notes (Addendum)
Complaint:  Visit Type: Patient returns to my office for continued preventative foot care services. Complaint: Patient states" my nails have grown long and thick and become painful to walk and wear shoes" The patient presents for preventative foot care services. No changes to ROS  Podiatric Exam: Vascular: dorsalis pedis and posterior tibial pulses are palpable bilateral. Capillary return is immediate. Temperature gradient is WNL. Skin turgor WNL  Sensorium: Normal Semmes Weinstein monofilament test. Normal tactile sensation bilaterally. Nail Exam: Pt has thick disfigured discolored nails with subungual debris noted bilateral entire nail hallux through fifth toenails Ulcer Exam: There is no evidence of ulcer or pre-ulcerative changes or infection. Orthopedic Exam: Muscle tone and strength are WNL. No limitations in general ROM. No crepitus or effusions noted. Plantar flexed 2,3  B/L.  Hammer toes  B/L. Skin: No Porokeratosis. No infection or ulcers  Diagnosis:  Onychomycosis, , Pain in right toe, pain in left toes  Treatment & Plan Procedures and Treatment: Consent by patient was obtained for treatment procedures.   Debridement of mycotic and hypertrophic toenails, 1 through 5 bilateral and clearing of subungual debris. No ulceration, no infection noted. ABN signed for 2019. Return Visit-Office Procedure: Patient instructed to return to the office for a follow up visit 3 months for continued evaluation and treatment.    Gardiner Barefoot DPM

## 2018-02-04 ENCOUNTER — Telehealth: Payer: Self-pay | Admitting: Cardiovascular Disease

## 2018-02-04 MED ORDER — LISINOPRIL 10 MG PO TABS: 10.0000 mg | ORAL_TABLET | Freq: Every day | ORAL | 6 refills | Status: AC

## 2018-02-04 NOTE — Telephone Encounter (Signed)
New message     Pt c/o BP issue: STAT if pt c/o blurred vision, one-sided weakness or slurred speech  1. What are your last 5 BP readings?  197/104  170/100   2. Are you having any other symptoms (ex. Dizziness, headache, blurred vision, passed out)? no  3. What is your BP issue? bp is running high, he cant sleep because it is so high

## 2018-02-04 NOTE — Telephone Encounter (Signed)
Reviewed with Dr. Meda Coffee who recommends pt stop Losartan and start Lisinopril 10 mg by mouth daily.  Continue to monitor blood pressure. I spoke with pt and gave him instructions from Dr. Meda Coffee.  I advised him to check BP daily about 2 hours after taking AM medications and keep record of these readings.  I asked him to call us in about 7-10 days with these readings. Will send prescription to [facility].

## 2018-02-04 NOTE — Telephone Encounter (Signed)
I spoke with pt. He reports he got up around 2 AM to go to bathroom.  Couldn't get back to sleep. Checked BP and it was 169/107.   Reports he has been having this problem for awhile and discussed with primary care at visit about 2 weeks.  States BP was high at that visit but he does not know reading.  No change in medications made at that time. This AM he has checked BP twice and it is 197/104 and 170/100.  Has been having slight dizziness at times but no other problems except difficulty sleeping. Doesn't check BP on a regular basis but states it has been higher this week. No other readings available.  He tried to check BP while on phone with me but is having trouble with machine and could not get reading.  Does not know pulse. He has not taken losartan or metoprolol yet today.  I told him to take these and I would review with doctor in the office and call him back.

## 2018-02-07 ENCOUNTER — Telehealth: Payer: Self-pay | Admitting: Cardiovascular Disease

## 2018-02-07 NOTE — Telephone Encounter (Signed)
New message    Pt c/o BP issue: STAT if pt c/o blurred vision, one-sided weakness or slurred speech  1. What are your last 5 BP readings? 150/89 169/92 last night 184/101 72 this morning   2. Are you having any other symptoms (ex. Dizziness, headache, blurred vision, passed out)?  No having problems sleeping   3. What is your BP issue? Patient having problem with bp being elevated , wants to know if he should call 911

## 2018-02-07 NOTE — Telephone Encounter (Signed)
Spoke with patient-transferred from [employer]. Pts BP is elevated last 2 weeks as noted by previous phone encounter. Was instructed on Friday to stop losartan and start lisinopril 10 mg.  He made this change on Saturday.  Last night 6pm BP 169/92, HR 64 Then 10 pm BP 159/85, HR 65  Denies headache, dizziness, SOB, CP, lightheadedness. This am before medicines 184/101, 72 Advised patient to sit down, take lisinopril and metoprolol and recheck BP while I go and speak with Dr. Angelena Form and then I will call him back. Per Dr. Angelena Form patient should contact his PCP and try to get an appointment today for BP management. Pt took BP when I called him back.  196/108, 74.  Just took medicines.  Continues to deny other s/s.  I spoke to Dr. Lina Sar office.  Tried to arrange appointment for him but ended up giving Dr. Lina Sar office patient's phone number.  We hung up so she could call him.

## 2018-02-08 DIAGNOSIS — F419 Anxiety disorder, unspecified: Secondary | ICD-10-CM | POA: Diagnosis not present

## 2018-02-08 DIAGNOSIS — I10 Essential (primary) hypertension: Secondary | ICD-10-CM | POA: Diagnosis not present

## 2018-02-16 DIAGNOSIS — N400 Enlarged prostate without lower urinary tract symptoms: Secondary | ICD-10-CM | POA: Diagnosis not present

## 2018-02-16 DIAGNOSIS — I10 Essential (primary) hypertension: Secondary | ICD-10-CM | POA: Diagnosis not present

## 2018-02-16 DIAGNOSIS — I2510 Atherosclerotic heart disease of native coronary artery without angina pectoris: Secondary | ICD-10-CM | POA: Diagnosis not present

## 2018-02-16 DIAGNOSIS — N183 Chronic kidney disease, stage 3 (moderate): Secondary | ICD-10-CM | POA: Diagnosis not present

## 2018-02-16 DIAGNOSIS — F329 Major depressive disorder, single episode, unspecified: Secondary | ICD-10-CM | POA: Diagnosis not present

## 2018-02-16 DIAGNOSIS — E785 Hyperlipidemia, unspecified: Secondary | ICD-10-CM | POA: Diagnosis not present

## 2018-03-29 DIAGNOSIS — E785 Hyperlipidemia, unspecified: Secondary | ICD-10-CM | POA: Diagnosis not present

## 2018-03-29 DIAGNOSIS — N183 Chronic kidney disease, stage 3 (moderate): Secondary | ICD-10-CM | POA: Diagnosis not present

## 2018-03-29 DIAGNOSIS — I10 Essential (primary) hypertension: Secondary | ICD-10-CM | POA: Diagnosis not present

## 2018-03-29 DIAGNOSIS — N400 Enlarged prostate without lower urinary tract symptoms: Secondary | ICD-10-CM | POA: Diagnosis not present

## 2018-03-29 DIAGNOSIS — I2510 Atherosclerotic heart disease of native coronary artery without angina pectoris: Secondary | ICD-10-CM | POA: Diagnosis not present

## 2018-03-29 DIAGNOSIS — F329 Major depressive disorder, single episode, unspecified: Secondary | ICD-10-CM | POA: Diagnosis not present

## 2018-04-18 DIAGNOSIS — Z85828 Personal history of other malignant neoplasm of skin: Secondary | ICD-10-CM | POA: Diagnosis not present

## 2018-04-18 DIAGNOSIS — L905 Scar conditions and fibrosis of skin: Secondary | ICD-10-CM | POA: Diagnosis not present

## 2018-04-18 DIAGNOSIS — L570 Actinic keratosis: Secondary | ICD-10-CM | POA: Diagnosis not present

## 2018-04-21 DIAGNOSIS — H6123 Impacted cerumen, bilateral: Secondary | ICD-10-CM | POA: Diagnosis not present

## 2018-04-21 DIAGNOSIS — H903 Sensorineural hearing loss, bilateral: Secondary | ICD-10-CM | POA: Diagnosis not present

## 2018-04-29 DIAGNOSIS — I2510 Atherosclerotic heart disease of native coronary artery without angina pectoris: Secondary | ICD-10-CM | POA: Diagnosis not present

## 2018-04-29 DIAGNOSIS — N183 Chronic kidney disease, stage 3 (moderate): Secondary | ICD-10-CM | POA: Diagnosis not present

## 2018-04-29 DIAGNOSIS — I10 Essential (primary) hypertension: Secondary | ICD-10-CM | POA: Diagnosis not present

## 2018-04-29 DIAGNOSIS — F329 Major depressive disorder, single episode, unspecified: Secondary | ICD-10-CM | POA: Diagnosis not present

## 2018-04-29 DIAGNOSIS — N400 Enlarged prostate without lower urinary tract symptoms: Secondary | ICD-10-CM | POA: Diagnosis not present

## 2018-04-29 DIAGNOSIS — E785 Hyperlipidemia, unspecified: Secondary | ICD-10-CM | POA: Diagnosis not present

## 2018-05-06 ENCOUNTER — Ambulatory Visit (INDEPENDENT_AMBULATORY_CARE_PROVIDER_SITE_OTHER): Payer: Medicare Other | Admitting: Podiatry

## 2018-05-06 ENCOUNTER — Encounter: Payer: Self-pay | Admitting: Podiatry

## 2018-05-06 DIAGNOSIS — M79609 Pain in unspecified limb: Secondary | ICD-10-CM

## 2018-05-06 DIAGNOSIS — M2030 Hallux varus (acquired), unspecified foot: Secondary | ICD-10-CM

## 2018-05-06 DIAGNOSIS — B351 Tinea unguium: Secondary | ICD-10-CM

## 2018-05-06 NOTE — Progress Notes (Signed)
Complaint:  Visit Type: Patient returns to my office for continued preventative foot care services. Complaint: Patient states" my nails have grown long and thick and become painful to walk and wear shoes" The patient presents for preventative foot care services. No changes to ROS  Podiatric Exam: Vascular: dorsalis pedis and posterior tibial pulses are palpable bilateral. Capillary return is immediate. Temperature gradient is WNL. Skin turgor WNL  Sensorium: Normal Semmes Weinstein monofilament test. Normal tactile sensation bilaterally. Nail Exam: Pt has thick disfigured discolored nails with subungual debris noted bilateral entire nail hallux through fifth toenails Ulcer Exam: There is no evidence of ulcer or pre-ulcerative changes or infection. Orthopedic Exam: Muscle tone and strength are WNL. No limitations in general ROM. No crepitus or effusions noted. Plantar flexed 2,3  B/L.  Hammer toes  B/L. Skin: No Porokeratosis. No infection or ulcers  Diagnosis:  Onychomycosis, , Pain in right toe, pain in left toes  Treatment & Plan Procedures and Treatment: Consent by patient was obtained for treatment procedures.   Debridement of mycotic and hypertrophic toenails, 1 through 5 bilateral and clearing of subungual debris. No ulceration, no infection noted. ABN signed for 2019. Return Visit-Office Procedure: Patient instructed to return to the office for a follow up visit 3 months for continued evaluation and treatment.    Gardiner Barefoot DPM

## 2018-05-12 DIAGNOSIS — F329 Major depressive disorder, single episode, unspecified: Secondary | ICD-10-CM | POA: Diagnosis not present

## 2018-05-12 DIAGNOSIS — I10 Essential (primary) hypertension: Secondary | ICD-10-CM | POA: Diagnosis not present

## 2018-05-12 DIAGNOSIS — I2510 Atherosclerotic heart disease of native coronary artery without angina pectoris: Secondary | ICD-10-CM | POA: Diagnosis not present

## 2018-05-12 DIAGNOSIS — N183 Chronic kidney disease, stage 3 (moderate): Secondary | ICD-10-CM | POA: Diagnosis not present

## 2018-05-12 DIAGNOSIS — E785 Hyperlipidemia, unspecified: Secondary | ICD-10-CM | POA: Diagnosis not present

## 2018-05-12 DIAGNOSIS — I129 Hypertensive chronic kidney disease with stage 1 through stage 4 chronic kidney disease, or unspecified chronic kidney disease: Secondary | ICD-10-CM | POA: Diagnosis not present

## 2018-05-12 DIAGNOSIS — N400 Enlarged prostate without lower urinary tract symptoms: Secondary | ICD-10-CM | POA: Diagnosis not present

## 2018-05-18 DIAGNOSIS — Z85828 Personal history of other malignant neoplasm of skin: Secondary | ICD-10-CM | POA: Diagnosis not present

## 2018-05-18 DIAGNOSIS — L905 Scar conditions and fibrosis of skin: Secondary | ICD-10-CM | POA: Diagnosis not present

## 2018-05-18 DIAGNOSIS — L570 Actinic keratosis: Secondary | ICD-10-CM | POA: Diagnosis not present

## 2018-05-18 DIAGNOSIS — C4441 Basal cell carcinoma of skin of scalp and neck: Secondary | ICD-10-CM | POA: Diagnosis not present

## 2018-05-19 DIAGNOSIS — C4441 Basal cell carcinoma of skin of scalp and neck: Secondary | ICD-10-CM | POA: Diagnosis not present

## 2018-06-06 DIAGNOSIS — N401 Enlarged prostate with lower urinary tract symptoms: Secondary | ICD-10-CM | POA: Diagnosis not present

## 2018-06-06 DIAGNOSIS — R351 Nocturia: Secondary | ICD-10-CM | POA: Diagnosis not present

## 2018-06-15 DIAGNOSIS — R5383 Other fatigue: Secondary | ICD-10-CM | POA: Diagnosis not present

## 2018-06-15 DIAGNOSIS — R1310 Dysphagia, unspecified: Secondary | ICD-10-CM | POA: Diagnosis not present

## 2018-06-15 DIAGNOSIS — R413 Other amnesia: Secondary | ICD-10-CM | POA: Diagnosis not present

## 2018-06-15 DIAGNOSIS — N400 Enlarged prostate without lower urinary tract symptoms: Secondary | ICD-10-CM | POA: Diagnosis not present

## 2018-06-15 DIAGNOSIS — I340 Nonrheumatic mitral (valve) insufficiency: Secondary | ICD-10-CM | POA: Diagnosis not present

## 2018-06-15 DIAGNOSIS — N183 Chronic kidney disease, stage 3 (moderate): Secondary | ICD-10-CM | POA: Diagnosis not present

## 2018-06-15 DIAGNOSIS — I10 Essential (primary) hypertension: Secondary | ICD-10-CM | POA: Diagnosis not present

## 2018-06-15 DIAGNOSIS — F419 Anxiety disorder, unspecified: Secondary | ICD-10-CM | POA: Diagnosis not present

## 2018-06-15 DIAGNOSIS — E7800 Pure hypercholesterolemia, unspecified: Secondary | ICD-10-CM | POA: Diagnosis not present

## 2018-07-07 NOTE — Progress Notes (Signed)
r  Chief Complaint  Patient presents with  . Follow-up    mitral regurgitation     History of Present Illness: 84 yo male with history of chronic kidney disease, paroxysmal atrial fibrillation, GERD, HTN, HLD and severe mitral regurgitation who is here today for follow up. He had been followed by Dr. Johnsie Cancel in our office. I saw him  in the valve clinic as a new patient in October 2016 to discuss his severe mitral regurgitation. 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 After a long discussion with the patient and his family, we elected for conservative management of his valve disease. He has remote atrial fibrillation but no recent recurrences. Echo September 2017 with normal LV systolic function, severe MR. PA pressure estimated at 56 mmHg  He is here today for follow up. The patient denies any chest pain, dyspnea, palpitations, lower extremity edema, orthopnea, PND, dizziness, near syncope or syncope.   Primary Care Physician: Seward Carol, MD   Past Medical History:  Diagnosis Date  . Anginal pain ([place])   . Anxiety   . Bleeding from the nose 2009   cautery by Dr. Erik Obey  . BPH (benign prostatic hyperplasia)   . CAD (coronary artery disease)   . Cataract    right eye  . CKD (chronic kidney disease), stage III ([place])   . Distal radius fracture 2013   Dr. Fredna Dow  . Dysrhythmia    hx of atrial fib- 10 years ago   . GERD (gastroesophageal reflux disease)   . Gout   . H/O hiatal hernia   . Hyperlipemia   . Hypertension   . Impaired fasting glucose   . Inguinal hernia without mention of obstruction or gangrene, unilateral or unspecified, (not specified as recurrent)   . Lumbar radiculopathy   . Major depressive disorder, single episode, unspecified   . Nosebleed    most recent 05/24/13  to have  cauterized prior to surgery   . Osteoarthritis   . Osteopenia 2002  . Skin cancer of forehead    left (BCE)  . Vertigo 1999   negative ct scan head; rare recurrance, last in 2013  . Vision disturbance     Past Surgical History:  Procedure Laterality Date  . ESOPHAGOGASTRODUODENOSCOPY N/A 07/10/2014   Procedure: ESOPHAGOGASTRODUODENOSCOPY (EGD);  Surgeon: Garlan Fair, MD;  Location: [facility];  Service: Endoscopy;  Laterality: N/A;  . EYE SURGERY     right eye cataract surgery   . GASTROSTOMY N/A 10/03/2014   Procedure: GASTROSTOMY TUBE;  Surgeon: Fanny Skates, MD;  Location: [facility];  Service: General;  Laterality: N/A;  . HIATAL HERNIA REPAIR N/A 10/03/2014   Procedure: [place], POSSIBLE OPEN;  Surgeon: Fanny Skates, MD;  Location: [facility];  Service: General;  Laterality: N/A;  . INGUINAL HERNIA REPAIR Right 06/07/2013   Procedure: LAPAROSCOPIC INGUINAL HERNIA REPAIR;  Surgeon: Adin Hector, MD;  Location: [facility];  Service: General;  Laterality: Right;  With Mesh  . INSERTION OF MESH Right 06/07/2013   Procedure: INSERTION OF MESH;  Surgeon: Adin Hector, MD;  Location: [facility];  Service: General;  Laterality: Right;  . TONSILLECTOMY     at age 75 yrs old    Current Outpatient Medications  Medication Sig Dispense Refill  . ALPRAZolam (XANAX) 0.25 MG tablet Take 0.125 mg by mouth 2 (two) times daily as needed for anxiety.     Marland Kitchen aspirin 81 MG tablet Take  81 mg by mouth daily.    . diphenhydrAMINE (BENADRYL) 25 MG tablet Take 25 mg by mouth at bedtime.    . meclizine (ANTIVERT) 12.5 MG tablet Take 1 tablet (12.5 mg total) by mouth 3 (three) times daily as needed for dizziness (Vertigo). 30 tablet 0  . metoprolol tartrate (LOPRESSOR) 25 MG tablet Take 12.5 mg by mouth 2 (two) times daily.    Marland Kitchen omeprazole (PRILOSEC) 20 MG capsule Take 40 mg by mouth every morning.    . polyethylene glycol (MIRALAX / GLYCOLAX) packet Take 17 g by mouth every other day.    . pravastatin (PRAVACHOL) 20 MG tablet Take 20 mg by mouth at bedtime.    . tamsulosin (FLOMAX) 0.4 MG CAPS capsule  Take 0.4 mg by mouth daily after supper.    Marland Kitchen lisinopril (PRINIVIL,ZESTRIL) 10 MG tablet Take 1 tablet (10 mg total) by mouth daily. 30 tablet 6   No current facility-administered medications for this visit.     Allergies  Allergen Reactions  . Penicillins Nausea Only    Upset stomach .     Social History   Socioeconomic History  . Marital status: Widowed    Spouse name: Not on file  . Number of children: 2  . Years of education: Not on file  . Highest education level: Not on file  Occupational History  . 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 Financial resource strain: Not on file  . Food insecurity:    Worry: Not on file    Inability: Not on file  . Transportation needs:    Medical: Not on file    Non-medical: Not on file  Tobacco Use  . Smoking status: Never Smoker  . Smokeless tobacco: Never Used  Substance and Sexual Activity  . Alcohol use: Yes    Comment: occasional  . Drug use: No  . Sexual activity: Not on file  Lifestyle  . Physical activity:    Days per week: Not on file    Minutes per session: Not on file  . Stress: Not on file  Relationships  . Social connections:    Talks on phone: Not on file    Gets together: Not on file    Attends religious service: Not on file    Active member of club or organization: Not on file    Attends meetings of clubs or organizations: Not on file    Relationship status: Not on file  . Intimate partner violence:    Fear of current or ex partner: Not on file    Emotionally abused: Not on file    Physically abused: Not on file    Forced sexual activity: Not on file  Other Topics Concern  . Not on file  Social History Narrative  . Not on file    Family History  Problem Relation Age of Onset  . Heart disease Mother   . Heart disease Father   . Cancer Sister        breast     Review of Systems:  As stated in the HPI and otherwise negative.   BP 114/64   Pulse  64   Ht 5\' 4"  (1.626 m)   Wt 138 lb (62.6 kg)   SpO2 98%   BMI 23.69 kg/m   Physical Examination:  General: Well developed, well nourished, NAD  HEENT: OP clear, mucus membranes moist  SKIN: warm, dry. No rashes. Neuro: No focal deficits  Musculoskeletal:  Muscle strength 5/5 all ext  Psychiatric: Mood and affect normal  Neck: No JVD, no carotid bruits, no thyromegaly, no lymphadenopathy.  Lungs:Clear bilaterally, no wheezes, rhonci, crackles Cardiovascular: Regular rate and rhythm. Loud systolic murmur.  Abdomen:Soft. Bowel sounds present. Non-tender.  Extremities: No lower extremity edema. Pulses are 2 + in the bilateral DP/PT.  Echo 08/24/16: Left ventricle: The cavity size was normal. There was moderate   concentric hypertrophy. Systolic function was normal. The   estimated ejection fraction was in the range of 60% to 65%. Wall   motion was normal; there were no regional wall motion   abnormalities. Features are consistent with a pseudonormal left   ventricular filling pattern, with concomitant abnormal relaxation   and increased filling pressure (grade 2 diastolic dysfunction). - Aortic valve: There was trivial regurgitation. - Mitral valve: Probable, severe flail motion involving the lateral   scallop of the posterior leaflet due to rupture of one or more   chords. There was severe regurgitation directed eccentrically and   toward the septum. Severe regurgitation is suggested by pulmonary   vein systolic flow reversal. - Left atrium: The atrium was moderately dilated. - Tricuspid valve: There was mild-moderate regurgitation directed   centrally. - Pulmonary arteries: Systolic pressure was moderately increased.   PA peak pressure: 56 mm Hg (S).  Recommendations:  Consider transesophageal echocardiography to better identify the anatomical abnormality leading to severe mitral insufficiency.  EKG:  EKG is not ordered today. The ekg ordered today demonstrates    Recent  Labs: 11/22/2017: ALT 18; Magnesium 2.0; TSH 0.949 11/23/2017: BUN 30; Creatinine, Ser 1.47; Hemoglobin 11.2; Platelets 160; Potassium 3.9; Sodium 139   Lipid Panel    Component Value Date/Time   CHOL 160 11/22/2017 1457   TRIG 111 11/22/2017 1457   HDL 66 11/22/2017 1457   CHOLHDL 2.4 11/22/2017 1457   VLDL 22 11/22/2017 1457   LDLCALC 72 11/22/2017 1457     Wt Readings from Last 3 Encounters:  07/08/18 138 lb (62.6 kg)  01/05/18 141 lb 1.9 oz (64 kg)  11/22/17 133 lb 6.1 oz (60.5 kg)     Other studies Reviewed: Additional studies/ records that were reviewed today include: . Review of the above records demonstrates:   Assessment and Plan:   1. Severe mitral regurgitation: He has severe MR that is felt to be due to flail leaflet with a probable chord rupture. He has fatigue but no volume overload. We will continue plans for conservative management.    Current medicines are reviewed at length with the patient today.  The patient does not have concerns regarding medicines.  The following changes have been made:  no change  Labs/ tests ordered today include:   No orders of the defined types were placed in this encounter.    Disposition:   FU with me in 6 months   Signed, Lauree Chandler, MD 07/08/2018 10:42 AM    [facility] [place], [place], [place]  [address] Phone: [phone]; Fax: [phone]

## 2018-07-08 ENCOUNTER — Ambulatory Visit (INDEPENDENT_AMBULATORY_CARE_PROVIDER_SITE_OTHER): Payer: Medicare Other | Admitting: Cardiovascular Disease

## 2018-07-08 ENCOUNTER — Encounter: Payer: Self-pay | Admitting: Cardiovascular Disease

## 2018-07-08 VITALS — BP 114/64 | HR 64 | Ht 64.0 in | Wt 138.0 lb

## 2018-07-08 DIAGNOSIS — I340 Nonrheumatic mitral (valve) insufficiency: Secondary | ICD-10-CM | POA: Diagnosis not present

## 2018-07-08 NOTE — Patient Instructions (Signed)

## 2018-07-11 DIAGNOSIS — R351 Nocturia: Secondary | ICD-10-CM | POA: Diagnosis not present

## 2018-07-11 DIAGNOSIS — N401 Enlarged prostate with lower urinary tract symptoms: Secondary | ICD-10-CM | POA: Diagnosis not present

## 2018-08-03 ENCOUNTER — Encounter: Payer: Self-pay | Admitting: Podiatry

## 2018-08-03 ENCOUNTER — Ambulatory Visit (INDEPENDENT_AMBULATORY_CARE_PROVIDER_SITE_OTHER): Payer: Medicare Other | Admitting: Podiatry

## 2018-08-03 DIAGNOSIS — M79676 Pain in unspecified toe(s): Secondary | ICD-10-CM

## 2018-08-03 DIAGNOSIS — M79609 Pain in unspecified limb: Principal | ICD-10-CM

## 2018-08-03 DIAGNOSIS — B351 Tinea unguium: Secondary | ICD-10-CM

## 2018-08-03 DIAGNOSIS — M2030 Hallux varus (acquired), unspecified foot: Secondary | ICD-10-CM | POA: Diagnosis not present

## 2018-08-03 DIAGNOSIS — M2040 Other hammer toe(s) (acquired), unspecified foot: Secondary | ICD-10-CM

## 2018-08-03 NOTE — Progress Notes (Signed)
Complaint:  Visit Type: Patient returns to my office for continued preventative foot care services. Complaint: Patient states" my nails have grown long and thick and become painful to walk and wear shoes" The patient presents for preventative foot care services. No changes to ROS  Podiatric Exam: Vascular: dorsalis pedis and posterior tibial pulses are palpable bilateral. Capillary return is immediate. Temperature gradient is WNL. Skin turgor WNL  Sensorium: Normal Semmes Weinstein monofilament test. Normal tactile sensation bilaterally. Nail Exam: Pt has thick disfigured discolored nails with subungual debris noted bilateral entire nail hallux through fifth toenails Ulcer Exam: There is no evidence of ulcer or pre-ulcerative changes or infection. Orthopedic Exam: Muscle tone and strength are WNL. No limitations in general ROM. No crepitus or effusions noted. Plantar flexed 2,3  B/L.  Hammer toes  B/L. Skin: No Porokeratosis. No infection or ulcers  Diagnosis:  Onychomycosis, , Pain in right toe, pain in left toes  Treatment & Plan Procedures and Treatment: Consent by patient was obtained for treatment procedures.   Debridement of mycotic and hypertrophic toenails, 1 through 5 bilateral and clearing of subungual debris. No ulceration, no infection noted. ABN signed for 2019.  Padding applied. Return Visit-Office Procedure: Patient instructed to return to the office for a follow up visit 3 months for continued evaluation and treatment.    Gardiner Barefoot DPM

## 2018-08-23 DIAGNOSIS — I10 Essential (primary) hypertension: Secondary | ICD-10-CM | POA: Diagnosis not present

## 2018-08-23 DIAGNOSIS — I2510 Atherosclerotic heart disease of native coronary artery without angina pectoris: Secondary | ICD-10-CM | POA: Diagnosis not present

## 2018-08-23 DIAGNOSIS — N400 Enlarged prostate without lower urinary tract symptoms: Secondary | ICD-10-CM | POA: Diagnosis not present

## 2018-08-23 DIAGNOSIS — E785 Hyperlipidemia, unspecified: Secondary | ICD-10-CM | POA: Diagnosis not present

## 2018-08-23 DIAGNOSIS — N183 Chronic kidney disease, stage 3 (moderate): Secondary | ICD-10-CM | POA: Diagnosis not present

## 2018-08-23 DIAGNOSIS — F329 Major depressive disorder, single episode, unspecified: Secondary | ICD-10-CM | POA: Diagnosis not present

## 2018-09-19 DIAGNOSIS — L905 Scar conditions and fibrosis of skin: Secondary | ICD-10-CM | POA: Diagnosis not present

## 2018-09-19 DIAGNOSIS — L570 Actinic keratosis: Secondary | ICD-10-CM | POA: Diagnosis not present

## 2018-09-19 DIAGNOSIS — Z85828 Personal history of other malignant neoplasm of skin: Secondary | ICD-10-CM | POA: Diagnosis not present

## 2018-09-19 DIAGNOSIS — D485 Neoplasm of uncertain behavior of skin: Secondary | ICD-10-CM | POA: Diagnosis not present

## 2018-09-19 DIAGNOSIS — B079 Viral wart, unspecified: Secondary | ICD-10-CM | POA: Diagnosis not present

## 2018-09-27 DIAGNOSIS — I10 Essential (primary) hypertension: Secondary | ICD-10-CM | POA: Diagnosis not present

## 2018-09-27 DIAGNOSIS — N183 Chronic kidney disease, stage 3 (moderate): Secondary | ICD-10-CM | POA: Diagnosis not present

## 2018-09-27 DIAGNOSIS — I129 Hypertensive chronic kidney disease with stage 1 through stage 4 chronic kidney disease, or unspecified chronic kidney disease: Secondary | ICD-10-CM | POA: Diagnosis not present

## 2018-09-27 DIAGNOSIS — N400 Enlarged prostate without lower urinary tract symptoms: Secondary | ICD-10-CM | POA: Diagnosis not present

## 2018-09-27 DIAGNOSIS — I2510 Atherosclerotic heart disease of native coronary artery without angina pectoris: Secondary | ICD-10-CM | POA: Diagnosis not present

## 2018-09-27 DIAGNOSIS — F329 Major depressive disorder, single episode, unspecified: Secondary | ICD-10-CM | POA: Diagnosis not present

## 2018-09-27 DIAGNOSIS — E785 Hyperlipidemia, unspecified: Secondary | ICD-10-CM | POA: Diagnosis not present

## 2018-11-02 ENCOUNTER — Ambulatory Visit (INDEPENDENT_AMBULATORY_CARE_PROVIDER_SITE_OTHER): Payer: Medicare Other | Admitting: Podiatry

## 2018-11-02 ENCOUNTER — Encounter: Payer: Self-pay | Admitting: Podiatry

## 2018-11-02 DIAGNOSIS — M79609 Pain in unspecified limb: Secondary | ICD-10-CM | POA: Diagnosis not present

## 2018-11-02 DIAGNOSIS — M2030 Hallux varus (acquired), unspecified foot: Secondary | ICD-10-CM

## 2018-11-02 DIAGNOSIS — B351 Tinea unguium: Secondary | ICD-10-CM

## 2018-11-02 NOTE — Progress Notes (Signed)
Complaint:  Visit Type: Patient returns to my office for continued preventative foot care services. Complaint: Patient states" my nails have grown long and thick and become painful to walk and wear shoes" The patient presents for preventative foot care services. No changes to ROS  Podiatric Exam: Vascular: dorsalis pedis and posterior tibial pulses are palpable bilateral. Capillary return is immediate. Temperature gradient is WNL. Skin turgor WNL  Sensorium: Normal Semmes Weinstein monofilament test. Normal tactile sensation bilaterally. Nail Exam: Pt has thick disfigured discolored nails with subungual debris noted bilateral entire nail hallux through fifth toenails Ulcer Exam: There is no evidence of ulcer or pre-ulcerative changes or infection. Orthopedic Exam: Muscle tone and strength are WNL. No limitations in general ROM. No crepitus or effusions noted. Plantar flexed 2,3  B/L.  Hammer toes  B/L. Skin: No Porokeratosis. No infection or ulcers  Diagnosis:  Onychomycosis, , Pain in right toe, pain in left toes  Treatment & Plan Procedures and Treatment: Consent by patient was obtained for treatment procedures.   Debridement of mycotic and hypertrophic toenails, 1 through 5 bilateral and clearing of subungual debris. No ulceration, no infection noted. ABN signed for 2019.  Padding applied. Return Visit-Office Procedure: Patient instructed to return to the office for a follow up visit 3 months for continued evaluation and treatment.    Gardiner Barefoot DPM

## 2018-11-14 ENCOUNTER — Ambulatory Visit
Admission: RE | Admit: 2018-11-14 | Discharge: 2018-11-14 | Disposition: A | Discharge status: Home / Self Care | Payer: Medicare Other | Source: Ambulatory Visit | Attending: Geriatric Medicine | Admitting: Geriatric Medicine

## 2018-11-14 ENCOUNTER — Other Ambulatory Visit: Payer: Self-pay | Admitting: Geriatric Medicine

## 2018-11-14 DIAGNOSIS — R059 Cough, unspecified: Secondary | ICD-10-CM

## 2018-11-14 DIAGNOSIS — R05 Cough: Secondary | ICD-10-CM | POA: Diagnosis not present

## 2018-11-14 DIAGNOSIS — J449 Chronic obstructive pulmonary disease, unspecified: Secondary | ICD-10-CM | POA: Diagnosis not present

## 2018-11-21 DIAGNOSIS — I2510 Atherosclerotic heart disease of native coronary artery without angina pectoris: Secondary | ICD-10-CM | POA: Diagnosis not present

## 2018-11-21 DIAGNOSIS — I129 Hypertensive chronic kidney disease with stage 1 through stage 4 chronic kidney disease, or unspecified chronic kidney disease: Secondary | ICD-10-CM | POA: Diagnosis not present

## 2018-11-21 DIAGNOSIS — N400 Enlarged prostate without lower urinary tract symptoms: Secondary | ICD-10-CM | POA: Diagnosis not present

## 2018-11-21 DIAGNOSIS — N183 Chronic kidney disease, stage 3 (moderate): Secondary | ICD-10-CM | POA: Diagnosis not present

## 2018-11-21 DIAGNOSIS — I10 Essential (primary) hypertension: Secondary | ICD-10-CM | POA: Diagnosis not present

## 2018-11-21 DIAGNOSIS — F329 Major depressive disorder, single episode, unspecified: Secondary | ICD-10-CM | POA: Diagnosis not present

## 2018-11-21 DIAGNOSIS — E785 Hyperlipidemia, unspecified: Secondary | ICD-10-CM | POA: Diagnosis not present

## 2019-01-09 DIAGNOSIS — N400 Enlarged prostate without lower urinary tract symptoms: Secondary | ICD-10-CM | POA: Diagnosis not present

## 2019-01-09 DIAGNOSIS — F419 Anxiety disorder, unspecified: Secondary | ICD-10-CM | POA: Diagnosis not present

## 2019-01-09 DIAGNOSIS — I10 Essential (primary) hypertension: Secondary | ICD-10-CM | POA: Diagnosis not present

## 2019-01-09 DIAGNOSIS — Z0000 Encounter for general adult medical examination without abnormal findings: Secondary | ICD-10-CM | POA: Diagnosis not present

## 2019-01-09 DIAGNOSIS — I340 Nonrheumatic mitral (valve) insufficiency: Secondary | ICD-10-CM | POA: Diagnosis not present

## 2019-01-09 DIAGNOSIS — E7800 Pure hypercholesterolemia, unspecified: Secondary | ICD-10-CM | POA: Diagnosis not present

## 2019-01-09 DIAGNOSIS — N183 Chronic kidney disease, stage 3 (moderate): Secondary | ICD-10-CM | POA: Diagnosis not present

## 2019-01-09 DIAGNOSIS — Z1389 Encounter for screening for other disorder: Secondary | ICD-10-CM | POA: Diagnosis not present

## 2019-01-09 DIAGNOSIS — R269 Unspecified abnormalities of gait and mobility: Secondary | ICD-10-CM | POA: Diagnosis not present

## 2019-01-10 DIAGNOSIS — R3915 Urgency of urination: Secondary | ICD-10-CM | POA: Diagnosis not present

## 2019-01-10 DIAGNOSIS — N401 Enlarged prostate with lower urinary tract symptoms: Secondary | ICD-10-CM | POA: Diagnosis not present

## 2019-01-11 DIAGNOSIS — N400 Enlarged prostate without lower urinary tract symptoms: Secondary | ICD-10-CM | POA: Diagnosis not present

## 2019-01-11 DIAGNOSIS — F329 Major depressive disorder, single episode, unspecified: Secondary | ICD-10-CM | POA: Diagnosis not present

## 2019-01-11 DIAGNOSIS — E785 Hyperlipidemia, unspecified: Secondary | ICD-10-CM | POA: Diagnosis not present

## 2019-01-11 DIAGNOSIS — I10 Essential (primary) hypertension: Secondary | ICD-10-CM | POA: Diagnosis not present

## 2019-01-11 DIAGNOSIS — N183 Chronic kidney disease, stage 3 (moderate): Secondary | ICD-10-CM | POA: Diagnosis not present

## 2019-01-11 DIAGNOSIS — I2510 Atherosclerotic heart disease of native coronary artery without angina pectoris: Secondary | ICD-10-CM | POA: Diagnosis not present

## 2019-01-11 DIAGNOSIS — I129 Hypertensive chronic kidney disease with stage 1 through stage 4 chronic kidney disease, or unspecified chronic kidney disease: Secondary | ICD-10-CM | POA: Diagnosis not present

## 2019-01-23 ENCOUNTER — Ambulatory Visit (INDEPENDENT_AMBULATORY_CARE_PROVIDER_SITE_OTHER): Payer: Medicare Other | Admitting: Cardiovascular Disease

## 2019-01-23 ENCOUNTER — Encounter: Payer: Self-pay | Admitting: Cardiovascular Disease

## 2019-01-23 VITALS — BP 118/60 | HR 76 | Ht 64.0 in | Wt 134.0 lb

## 2019-01-23 DIAGNOSIS — I340 Nonrheumatic mitral (valve) insufficiency: Secondary | ICD-10-CM

## 2019-01-23 NOTE — Patient Instructions (Signed)
Medication Instructions:  Your physician recommends that you continue on your current medications as directed. Please refer to the Current Medication list given to you today.  If you need a refill on your cardiac medications before your next appointment, please call your pharmacy.   Lab work: none If you have labs (blood work) drawn today and your tests are completely normal, you will receive your results only by: Marland Kitchen MyChart Message (if you have MyChart) OR . A paper copy in the mail If you have any lab test that is abnormal or we need to change your treatment, we will call you to review the results.  Testing/Procedures: none  Follow-Up: At [facility], you and your health needs are our priority.  As part of our continuing mission to provide you with exceptional heart care, we have created designated Provider Care Teams.  These Care Teams include your primary Cardiologist (physician) and Advanced Practice Providers (APPs -  Physician Assistants and Nurse Practitioners) who all work together to provide you with the care you need, when you need it. You will need a follow up appointment in 6 months.  Please call our office 4 months in advance to schedule this appointment.  You may see Lauree Chandler, MD or one of the following Advanced Practice Providers on your designated Care Team:   [place], PA-C Melina Copa, PA-C . Ermalinda Barrios, PA-C  Any Other Special Instructions Will Be Listed Below (If Applicable).

## 2019-01-23 NOTE — Progress Notes (Signed)
r  Chief Complaint  Patient presents with  . Follow-up    PAF   History of Present Illness: 85 yo male with history of chronic kidney disease, paroxysmal atrial fibrillation, GERD, HTN, HLD and severe mitral regurgitation who is here today for follow up. He had been followed by Dr. Johnsie Cancel in our office. I saw him  in the valve clinic as a new patient in October 2016 to discuss his severe mitral regurgitation. 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 After a long discussion with the patient and his family, we elected for conservative management of his valve disease. He has remote atrial fibrillation but no recent recurrences. Echo September 2017 with normal LV systolic function, severe MR. PA pressure estimated at 56 mmHg.   He is here today for follow up. The patient denies any chest pain, palpitations, lower extremity edema, orthopnea, PND, dizziness, near syncope or syncope. He continue to have fatigue and dyspnea with exertion. He is able to complete his chores but takes frequent rest breaks. He is satisfied with his overall quality of life.   Primary Care Physician: Seward Carol, MD  Past Medical History:  Diagnosis Date  . Anginal pain ([place])   . Anxiety   . Bleeding from the nose 2009   cautery by Dr. Erik Obey  . BPH (benign prostatic hyperplasia)   . CAD (coronary artery disease)   . Cataract    right eye  . CKD (chronic kidney disease), stage III ([place])   . Distal radius fracture 2013   Dr. Fredna Dow  . Dysrhythmia    hx of atrial fib- 10 years ago   . GERD (gastroesophageal reflux disease)   . Gout   . H/O hiatal hernia   . Hyperlipemia   . Hypertension   . Impaired fasting glucose   . Inguinal hernia without mention of obstruction or gangrene, unilateral or unspecified, (not specified as recurrent)   . Lumbar radiculopathy   . Major depressive disorder, single episode, unspecified   . Nosebleed    most recent 05/24/13  to  have  cauterized prior to surgery   . Osteoarthritis   . Osteopenia 2002  . Skin cancer of forehead    left (BCE)  . Vertigo 1999   negative ct scan head; rare recurrance, last in 2013  . Vision disturbance     Past Surgical History:  Procedure Laterality Date  . ESOPHAGOGASTRODUODENOSCOPY N/A 07/10/2014   Procedure: ESOPHAGOGASTRODUODENOSCOPY (EGD);  Surgeon: Garlan Fair, MD;  Location: [facility];  Service: Endoscopy;  Laterality: N/A;  . EYE SURGERY     right eye cataract surgery   . GASTROSTOMY N/A 10/03/2014   Procedure: GASTROSTOMY TUBE;  Surgeon: Fanny Skates, MD;  Location: [facility];  Service: General;  Laterality: N/A;  . HIATAL HERNIA REPAIR N/A 10/03/2014   Procedure: [place], POSSIBLE OPEN;  Surgeon: Fanny Skates, MD;  Location: [facility];  Service: General;  Laterality: N/A;  . INGUINAL HERNIA REPAIR Right 06/07/2013   Procedure: LAPAROSCOPIC INGUINAL HERNIA REPAIR;  Surgeon: Adin Hector, MD;  Location: [facility];  Service: General;  Laterality: Right;  With Mesh  . INSERTION OF MESH Right 06/07/2013   Procedure: INSERTION OF MESH;  Surgeon: Adin Hector, MD;  Location: [facility];  Service: General;  Laterality: Right;  . TONSILLECTOMY     at age 68 yrs old    Current Outpatient Medications  Medication Sig Dispense Refill  . ALPRAZolam (  XANAX) 0.25 MG tablet Take 0.125 mg by mouth 2 (two) times daily as needed for anxiety.     Marland Kitchen aspirin 81 MG tablet Take 81 mg by mouth daily.    . diphenhydrAMINE (BENADRYL) 25 MG tablet Take 25 mg by mouth at bedtime.    . meclizine (ANTIVERT) 12.5 MG tablet Take 1 tablet (12.5 mg total) by mouth 3 (three) times daily as needed for dizziness (Vertigo). 30 tablet 0  . metoprolol tartrate (LOPRESSOR) 25 MG tablet Take 12.5 mg by mouth 2 (two) times daily.    Marland Kitchen omeprazole (PRILOSEC) 20 MG capsule Take 40 mg by mouth every morning.    . polyethylene glycol (MIRALAX / GLYCOLAX) packet Take 17 g by mouth  every other day.    . pravastatin (PRAVACHOL) 20 MG tablet Take 20 mg by mouth at bedtime.    . tamsulosin (FLOMAX) 0.4 MG CAPS capsule Take 0.4 mg by mouth daily after supper.    Marland Kitchen lisinopril (PRINIVIL,ZESTRIL) 10 MG tablet Take 1 tablet (10 mg total) by mouth daily. 30 tablet 6   No current facility-administered medications for this visit.     Allergies  Allergen Reactions  . Penicillins Nausea Only    Upset stomach .     Social History   Socioeconomic History  . Marital status: Widowed    Spouse name: Not on file  . Number of children: 2  . Years of education: Not on file  . Highest education level: Not on file  Occupational History  . 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 Financial resource strain: Not on file  . Food insecurity:    Worry: Not on file    Inability: Not on file  . Transportation needs:    Medical: Not on file    Non-medical: Not on file  Tobacco Use  . Smoking status: Never Smoker  . Smokeless tobacco: Never Used  Substance and Sexual Activity  . Alcohol use: Yes    Comment: occasional  . Drug use: No  . Sexual activity: Not on file  Lifestyle  . Physical activity:    Days per week: Not on file    Minutes per session: Not on file  . Stress: Not on file  Relationships  . Social connections:    Talks on phone: Not on file    Gets together: Not on file    Attends religious service: Not on file    Active member of club or organization: Not on file    Attends meetings of clubs or organizations: Not on file    Relationship status: Not on file  . Intimate partner violence:    Fear of current or ex partner: Not on file    Emotionally abused: Not on file    Physically abused: Not on file    Forced sexual activity: Not on file  Other Topics Concern  . Not on file  Social History Narrative  . Not on file    Family History  Problem Relation Age of Onset  . Heart disease Mother   . Heart  disease Father   . Cancer Sister        breast     Review of Systems:  As stated in the HPI and otherwise negative.   BP 118/60   Pulse 76   Ht 5\' 4"  (1.626 m)   Wt 134 lb (60.8 kg)   SpO2 98%   BMI 23.00 kg/m   Physical  Examination:  General: Well developed, well nourished, NAD  HEENT: OP clear, mucus membranes moist  SKIN: warm, dry. No rashes. Neuro: No focal deficits  Musculoskeletal: Muscle strength 5/5 all ext  Psychiatric: Mood and affect normal  Neck: No JVD, no carotid bruits, no thyromegaly, no lymphadenopathy.  Lungs:Clear bilaterally, no wheezes, rhonci, crackles Cardiovascular: Regular rate and rhythm. Loud 4/6 systolic murmur at the LLSB.  Abdomen:Soft. Bowel sounds present. Non-tender.  Extremities: No lower extremity edema. Pulses are 2 + in the bilateral DP/PT.  Echo 08/24/16: Left ventricle: The cavity size was normal. There was moderate   concentric hypertrophy. Systolic function was normal. The   estimated ejection fraction was in the range of 60% to 65%. Wall   motion was normal; there were no regional wall motion   abnormalities. Features are consistent with a pseudonormal left   ventricular filling pattern, with concomitant abnormal relaxation   and increased filling pressure (grade 2 diastolic dysfunction). - Aortic valve: There was trivial regurgitation. - Mitral valve: Probable, severe flail motion involving the lateral   scallop of the posterior leaflet due to rupture of one or more   chords. There was severe regurgitation directed eccentrically and   toward the septum. Severe regurgitation is suggested by pulmonary   vein systolic flow reversal. - Left atrium: The atrium was moderately dilated. - Tricuspid valve: There was mild-moderate regurgitation directed   centrally. - Pulmonary arteries: Systolic pressure was moderately increased.   PA peak pressure: 56 mm Hg (S).  EKG:  EKG is ordered today. The ekg ordered today demonstrates  Sinus  with PACs. Rate 76 bpm. Non-specific T wave abnormality-unchanged.  Recent Labs: No results found for requested labs within last 8760 hours.   Lipid Panel    Component Value Date/Time   CHOL 160 11/22/2017 1457   TRIG 111 11/22/2017 1457   HDL 66 11/22/2017 1457   CHOLHDL 2.4 11/22/2017 1457   VLDL 22 11/22/2017 1457   LDLCALC 72 11/22/2017 1457     Wt Readings from Last 3 Encounters:  01/23/19 134 lb (60.8 kg)  07/08/18 138 lb (62.6 kg)  01/05/18 141 lb 1.9 oz (64 kg)     Other studies Reviewed: Additional studies/ records that were reviewed today include: . Review of the above records demonstrates:   Assessment and Plan:   1. Severe mitral regurgitation: He has severe MR that is felt to be due to flail leaflet with a probable chord rupture. He continues to have fatigue but no evidence of volume overload. Will continue conservative approach. Will review echo with mitraclip team but I think his options are limited.   Current medicines are reviewed at length with the patient today.  The patient does not have concerns regarding medicines.  The following changes have been made:  no change  Labs/ tests ordered today include:   Orders Placed This Encounter  Procedures  . EKG 12-Lead     Disposition:   FU with me in 6 months   Signed, Lauree Chandler, MD 01/23/2019 10:46 AM    [facility] [place], [place], [place]  [address] Phone: [phone]; Fax: [phone]

## 2019-01-26 ENCOUNTER — Emergency Department (HOSPITAL_COMMUNITY)
Admission: EM | Admit: 2019-01-26 | Discharge: 2019-01-26 | Disposition: A | Discharge status: Home / Self Care | Payer: Medicare Other | Attending: Emergency Medicine | Admitting: Emergency Medicine

## 2019-01-26 ENCOUNTER — Encounter (HOSPITAL_COMMUNITY): Payer: Self-pay

## 2019-01-26 ENCOUNTER — Emergency Department (HOSPITAL_COMMUNITY): Payer: Medicare Other

## 2019-01-26 DIAGNOSIS — I129 Hypertensive chronic kidney disease with stage 1 through stage 4 chronic kidney disease, or unspecified chronic kidney disease: Secondary | ICD-10-CM | POA: Insufficient documentation

## 2019-01-26 DIAGNOSIS — I2510 Atherosclerotic heart disease of native coronary artery without angina pectoris: Secondary | ICD-10-CM | POA: Insufficient documentation

## 2019-01-26 DIAGNOSIS — Z85828 Personal history of other malignant neoplasm of skin: Secondary | ICD-10-CM | POA: Insufficient documentation

## 2019-01-26 DIAGNOSIS — Z79899 Other long term (current) drug therapy: Secondary | ICD-10-CM | POA: Insufficient documentation

## 2019-01-26 DIAGNOSIS — I10 Essential (primary) hypertension: Secondary | ICD-10-CM | POA: Diagnosis not present

## 2019-01-26 DIAGNOSIS — Z7982 Long term (current) use of aspirin: Secondary | ICD-10-CM | POA: Diagnosis not present

## 2019-01-26 DIAGNOSIS — R0789 Other chest pain: Secondary | ICD-10-CM

## 2019-01-26 DIAGNOSIS — Z7902 Long term (current) use of antithrombotics/antiplatelets: Secondary | ICD-10-CM | POA: Insufficient documentation

## 2019-01-26 DIAGNOSIS — R079 Chest pain, unspecified: Secondary | ICD-10-CM | POA: Diagnosis not present

## 2019-01-26 DIAGNOSIS — N183 Chronic kidney disease, stage 3 (moderate): Secondary | ICD-10-CM | POA: Insufficient documentation

## 2019-01-26 LAB — LIPASE, BLOOD: Lipase: 27 U/L (ref 11–51)

## 2019-01-26 LAB — COMPREHENSIVE METABOLIC PANEL
ALT: 17 U/L (ref 0–44)
AST: 26 U/L (ref 15–41)
Albumin: 3.6 g/dL (ref 3.5–5.0)
Alkaline Phosphatase: 69 U/L (ref 38–126)
Anion gap: 7 (ref 5–15)
BUN: 33 mg/dL — ABNORMAL HIGH (ref 8–23)
CO2: 25 mmol/L (ref 22–32)
Calcium: 9.1 mg/dL (ref 8.9–10.3)
Chloride: 105 mmol/L (ref 98–111)
Creatinine, Ser: 1.92 mg/dL — ABNORMAL HIGH (ref 0.61–1.24)
GFR calc Af Amer: 34 mL/min — ABNORMAL LOW (ref >60)
GFR calc non Af Amer: 29 mL/min — ABNORMAL LOW (ref >60)
Glucose, Bld: 115 mg/dL — ABNORMAL HIGH (ref 70–99)
Potassium: 4.1 mmol/L (ref 3.5–5.1)
Sodium: 137 mmol/L (ref 135–145)
Total Bilirubin: 0.4 mg/dL (ref 0.3–1.2)
Total Protein: 6 g/dL — ABNORMAL LOW (ref 6.5–8.1)

## 2019-01-26 LAB — CBC
HCT: 32.7 % — ABNORMAL LOW (ref 39.0–52.0)
Hemoglobin: 10.1 g/dL — ABNORMAL LOW (ref 13.0–17.0)
MCH: 30.5 pg (ref 26.0–34.0)
MCHC: 30.9 g/dL (ref 30.0–36.0)
MCV: 98.8 fL (ref 80.0–100.0)
Platelets: 150 10*3/uL (ref 150–400)
RBC: 3.31 MIL/uL — ABNORMAL LOW (ref 4.22–5.81)
RDW: 13.3 % (ref 11.5–15.5)
WBC: 6.9 10*3/uL (ref 4.0–10.5)
nRBC: 0 % (ref 0.0–0.2)

## 2019-01-26 LAB — I-STAT TROPONIN, ED
Troponin i, poc: 0.02 ng/mL (ref 0.00–0.08)
Troponin i, poc: 0.01 ng/mL (ref 0.00–0.08)

## 2019-01-26 MED ORDER — ALUM & MAG HYDROXIDE-SIMETH 200-200-20 MG/5ML PO SUSP
30.0000 mL | Freq: Once | ORAL | Status: AC
  Administered 2019-01-26: 30 mL via ORAL
  Filled 2019-01-26: qty 30

## 2019-01-26 NOTE — ED Notes (Signed)
Patient transported to X-ray 

## 2019-01-26 NOTE — ED Notes (Signed)
This RN called [facility] where staff member was familiar with patient. Staff member states she will inform the staff responsible for transport and come to [facility] ED entrance to pick up patient. Staff verified they have patients phone number and will pick him up.

## 2019-01-26 NOTE — ED Triage Notes (Signed)
Patient BIB [facility] from [facility] for chest pain since 2100 last night. Patient describes pain as tightness and non radiating. Patient denies SOB, nausea, vomiting, abdominal pain, or fever. States "I just thought I should have it checked out". Patient given 324 mg aspirin from EMS.   BP 150/90 HR 60 RR 16 98% RA

## 2019-01-26 NOTE — ED Provider Notes (Addendum)
[facility] EMERGENCY DEPARTMENT Provider Note  CSN: [id number] Arrival date & time: 01/26/19 0235  Chief Complaint(s) Chest Pain  HPI Larry Mcfarland is a 85 y.o. male with a past medical history listed below including mitral valve regurgitation, CAD who presents to the emergency department with mild to moderate chest tightness that began 6 hours prior to arrival.  Pain began in right lower chest and settled in left lateral chest.  Otherwise nonradiating.  Currently mild.  No alleviating or aggravating factors.  Pain is nonexertional.  No associated shortness of breath.  No recent fevers or infections, cough, nausea, emesis, abdominal pain.  Denies any other physical complaints.   Chest Pain    Past Medical History Past Medical History:  Diagnosis Date  . Anginal pain ([place])   . Anxiety   . Bleeding from the nose 2009   cautery by Dr. Erik Obey  . BPH (benign prostatic hyperplasia)   . CAD (coronary artery disease)   . Cataract    right eye  . CKD (chronic kidney disease), stage III ([place])   . Distal radius fracture 2013   Dr. Fredna Dow  . Dysrhythmia    hx of atrial fib- 10 years ago   . GERD (gastroesophageal reflux disease)   . Gout   . H/O hiatal hernia   . Hyperlipemia   . Hypertension   . Impaired fasting glucose   . Inguinal hernia without mention of obstruction or gangrene, unilateral or unspecified, (not specified as recurrent)   . Lumbar radiculopathy   . Major depressive disorder, single episode, unspecified   . Nosebleed    most recent 05/24/13  to have  cauterized prior to surgery   . Osteoarthritis   . Osteopenia 2002  . Skin cancer of forehead    left (BCE)  . Vertigo 1999   negative ct scan head; rare recurrance, last in 2013  . Vision disturbance    Patient Active Problem List   Diagnosis Date Noted  . CKD (chronic kidney disease), stage III ([place])   . Vertigo 11/22/2017  . BPH without urinary obstruction 10/16/2014  . Hyperlipidemia  10/16/2014  . Arrhythmia 10/08/2014  . Gastrojejunostomy tube status ([place]) 10/05/2014  . Preop cardiovascular exam 09/25/2014  . HTN (hypertension) 09/25/2014  . PAF (paroxysmal atrial fibrillation) ([place]) 09/25/2014  . Hiatal hernia s/p lap Repair with G-tube Nov 2015 07/30/2014  . Right inguinal hernia 01/10/2013   Home Medication(s) Prior to Admission medications   Medication Sig Start Date End Date Taking? Authorizing Provider  ALPRAZolam (XANAX) 0.25 MG tablet Take 0.125 mg by mouth 2 (two) times daily as needed for anxiety or sleep.    Yes [provider]  aspirin 81 MG tablet Take 81 mg by mouth daily.   Yes [provider]  lisinopril (PRINIVIL,ZESTRIL) 10 MG tablet Take 1 tablet (10 mg total) by mouth daily. 02/04/18 01/26/19 Yes Dorothy Spark, MD  metoprolol tartrate (LOPRESSOR) 25 MG tablet Take 12.5 mg by mouth 2 (two) times daily.   Yes [provider]  omeprazole (PRILOSEC) 20 MG capsule Take 40 mg by mouth daily before breakfast.    Yes [provider]  polyethylene glycol (MIRALAX / GLYCOLAX) packet Take 17 g by mouth every other day.   Yes [provider]  pravastatin (PRAVACHOL) 20 MG tablet Take 20 mg by mouth at bedtime.   Yes [provider]  tamsulosin (FLOMAX) 0.4 MG CAPS capsule Take 0.4 mg by mouth daily after supper.  Yes [provider]  meclizine (ANTIVERT) 12.5 MG tablet Take 1 tablet (12.5 mg total) by mouth 3 (three) times daily as needed for dizziness ([place]igo). Patient not taking: Reported on 01/26/2019 11/24/17   Rosita Fire, MD                                                                                                                                    Past Surgical History Past Surgical History:  Procedure Laterality Date  . ESOPHAGOGASTRODUODENOSCOPY N/A 07/10/2014   Procedure: ESOPHAGOGASTRODUODENOSCOPY (EGD);  Surgeon: Garlan Fair, MD;  Location: [facility];  Service:  Endoscopy;  Laterality: N/A;  . EYE SURGERY     right eye cataract surgery   . GASTROSTOMY N/A 10/03/2014   Procedure: GASTROSTOMY TUBE;  Surgeon: Fanny Skates, MD;  Location: [facility];  Service: General;  Laterality: N/A;  . HIATAL HERNIA REPAIR N/A 10/03/2014   Procedure: [place], POSSIBLE OPEN;  Surgeon: Fanny Skates, MD;  Location: [facility];  Service: General;  Laterality: N/A;  . INGUINAL HERNIA REPAIR Right 06/07/2013   Procedure: LAPAROSCOPIC INGUINAL HERNIA REPAIR;  Surgeon: Adin Hector, MD;  Location: [facility];  Service: General;  Laterality: Right;  With Mesh  . INSERTION OF MESH Right 06/07/2013   Procedure: INSERTION OF MESH;  Surgeon: Adin Hector, MD;  Location: [facility];  Service: General;  Laterality: Right;  . TONSILLECTOMY     at age 53 yrs old   Family History Family History  Problem Relation Age of Onset  . Heart disease Mother   . Heart disease Father   . Cancer Sister        breast     Social History Social History   Tobacco Use  . Smoking status: Never Smoker  . Smokeless tobacco: Never Used  Substance Use Topics  . Alcohol use: Yes    Comment: occasional  . Drug use: No   Allergies Milk-related compounds and Penicillins  Review of Systems Review of Systems  Cardiovascular: Positive for chest pain.   All other systems are reviewed and are negative for acute change except as noted in the HPI  Physical Exam Vital Signs  I have reviewed the triage vital signs BP 129/88   Pulse (!) 56   Temp 97.6 F (36.4 C) (Oral)   Resp 20   Ht 5' (1.524 m)   Wt 59 kg   SpO2 100%   BMI 25.39 kg/m   Physical Exam Vitals signs reviewed.  Constitutional:      General: He is not in acute distress.    Appearance: He is well-developed. He is not diaphoretic.  HENT:     Head: Normocephalic and atraumatic.     Nose: Nose normal.  Eyes:     General: No scleral icterus.       Right eye: No discharge.        Left eye:  No  discharge.     Conjunctiva/sclera: Conjunctivae normal.     Pupils: Pupils are equal, round, and reactive to light.  Neck:     Musculoskeletal: Normal range of motion and neck supple.  Cardiovascular:     Rate and Rhythm: Normal rate and regular rhythm.     Heart sounds: Murmur present. Systolic (best heard over apex) murmur present with a grade of 3/6. No friction rub. No gallop.   Pulmonary:     Effort: Pulmonary effort is normal. No respiratory distress.     Breath sounds: Normal breath sounds. No stridor. No rales.  Chest:     Chest wall: Tenderness present.    Abdominal:     General: There is no distension.     Palpations: Abdomen is soft.     Tenderness: There is no abdominal tenderness.  Musculoskeletal:        General: No tenderness.  Skin:    General: Skin is warm and dry.     Findings: No erythema or rash.  Neurological:     Mental Status: He is alert and oriented to person, place, and time.     ED Results and Treatments Labs (all labs ordered are listed, but only abnormal results are displayed) Labs Reviewed  CBC - Abnormal; Notable for the following components:      Result Value   RBC 3.31 (*)    Hemoglobin 10.1 (*)    HCT 32.7 (*)    All other components within normal limits  COMPREHENSIVE METABOLIC PANEL - Abnormal; Notable for the following components:   Glucose, Bld 115 (*)    BUN 33 (*)    Creatinine, Ser 1.92 (*)    Total Protein 6.0 (*)    GFR calc non Af Amer 29 (*)    GFR calc Af Amer 34 (*)    All other components within normal limits  LIPASE, BLOOD  I-STAT TROPONIN, ED  I-STAT TROPONIN, ED                                                                                                                         EKG  EKG Interpretation  Date/Time:  Thursday January 26 2019 02:46:52 EST Ventricular Rate:  58 PR Interval:    QRS Duration: 103 QT Interval:  441 QTC Calculation: 434 R Axis:   38 Text Interpretation:  Sinus arrhythmia  Borderline T abnormalities, inferior leads No significant change since last tracing Confirmed by Addison Lank [phone]) on 01/26/2019 3:19:58 AM      Radiology Dg Chest 2 View  Result Date: 01/26/2019 CLINICAL DATA:  Chest pain beginning last night. EXAM: CHEST - 2 VIEW COMPARISON:  Chest radiograph November 14, 2018 FINDINGS: Cardiac silhouette is mildly enlarged. Mediastinal silhouette is not suspicious. Mildly tortuous calcified aorta. Mild chronic interstitial changes and increased lung volumes. Bibasilar strandy densities. No pleural effusion or focal consolidation. LEFT mid lung zone granuloma. No pneumothorax. Osteopenia. Old midthoracic compression fractures. IMPRESSION: Stable cardiomegaly. COPD.  Bibasilar atelectasis/scarring. Electronically Signed   By: Elon Alas M.D.   On: 01/26/2019 04:54   Pertinent labs & imaging results that were available during my care of the patient were reviewed by me and considered in my medical decision making (see chart for details).  Medications Ordered in ED Medications  alum & mag hydroxide-simeth (MAALOX/MYLANTA) 200-200-20 MG/5ML suspension 30 mL (30 mLs Oral Given 01/26/19 0421)                                                                                                                                    Procedures Procedures  (including critical care time)  Medical Decision Making / ED Course I have reviewed the nursing notes for this encounter and the patient's prior records (if available in EHR or on provided paperwork).    Atypical pain.  EKG without acute ischemic changes or evidence of pericarditis.  Initial troponin negative.  Low suspicion for ACS but will repeat troponin; this will be at the 9-hour mark.  Pain is most consistent with either MSK/chest wall versus GI related.  Patient given GI cocktail resulting in complete resolution of his pain.  Low suspicion for pulmonary embolism.  Presentation not classic for aortic  dissection or esophageal perforation.  Chest x-ray without evidence suggestive of pneumonia, pneumothorax, pneumomediastinum.  No abnormal contour of the mediastinum to suggest dissection. No evidence of acute injuries.  Troponin at the 9-hour mark following onset of patient's pain is negative.  Patient remains chest pain-free.  The patient appears reasonably screened and/or stabilized for discharge and I doubt any other medical condition or other [facility] requiring further screening, evaluation, or treatment in the ED at this time prior to discharge.  The patient is safe for discharge with strict return precautions.   Final Clinical Impression(s) / ED Diagnoses Final diagnoses:  Chest pain  Atypical chest pain    Disposition: Discharge  Condition: Good  I have discussed the results, Dx and Tx plan with the patient who expressed understanding and agree(s) with the plan. Discharge instructions discussed at great length. The patient was given strict return precautions who verbalized understanding of the instructions. No further questions at time of discharge.    ED Discharge Orders    None       Follow Up: Seward Carol, MD [place] [address] [place] [address] [phone]  Schedule an appointment as soon as possible for a visit  As needed     This chart was dictated using voice recognition software.  Despite best efforts to proofread,  errors can occur which can change the documentation meaning.     Fatima Blank, MD 01/26/19 [phone]

## 2019-01-30 DIAGNOSIS — R0789 Other chest pain: Secondary | ICD-10-CM | POA: Diagnosis not present

## 2019-01-30 DIAGNOSIS — R5383 Other fatigue: Secondary | ICD-10-CM | POA: Diagnosis not present

## 2019-01-30 DIAGNOSIS — F419 Anxiety disorder, unspecified: Secondary | ICD-10-CM | POA: Diagnosis not present

## 2019-01-30 DIAGNOSIS — N183 Chronic kidney disease, stage 3 (moderate): Secondary | ICD-10-CM | POA: Diagnosis not present

## 2019-02-01 ENCOUNTER — Ambulatory Visit: Payer: Medicare Other | Admitting: Podiatry

## 2019-03-31 ENCOUNTER — Encounter: Payer: Self-pay | Admitting: Podiatry

## 2019-03-31 ENCOUNTER — Other Ambulatory Visit: Payer: Self-pay

## 2019-03-31 ENCOUNTER — Ambulatory Visit (INDEPENDENT_AMBULATORY_CARE_PROVIDER_SITE_OTHER): Payer: Medicare Other | Admitting: Podiatry

## 2019-03-31 DIAGNOSIS — M79676 Pain in unspecified toe(s): Secondary | ICD-10-CM

## 2019-03-31 DIAGNOSIS — M79609 Pain in unspecified limb: Principal | ICD-10-CM

## 2019-03-31 DIAGNOSIS — M2030 Hallux varus (acquired), unspecified foot: Secondary | ICD-10-CM

## 2019-03-31 DIAGNOSIS — B351 Tinea unguium: Secondary | ICD-10-CM | POA: Diagnosis not present

## 2019-03-31 NOTE — Progress Notes (Signed)
Complaint:  Visit Type: Patient returns to my office for continued preventative foot care services. Complaint: Patient states" my nails have grown long and thick and become painful to walk and wear shoes" The patient presents for preventative foot care services. No changes to ROS  Podiatric Exam: Vascular: dorsalis pedis and posterior tibial pulses are palpable bilateral. Capillary return is immediate. Temperature gradient is WNL. Skin turgor WNL  Sensorium: Normal Semmes Weinstein monofilament test. Normal tactile sensation bilaterally. Nail Exam: Pt has thick disfigured discolored nails with subungual debris noted bilateral entire nail hallux through fifth toenails Ulcer Exam: There is no evidence of ulcer or pre-ulcerative changes or infection. Orthopedic Exam: Muscle tone and strength are WNL. No limitations in general ROM. No crepitus or effusions noted. Plantar flexed 2,3  B/L.  Hammer toes  B/L. Skin: No Porokeratosis. No infection or ulcers  Diagnosis:  Onychomycosis, , Pain in right toe, pain in left toes  Treatment & Plan Procedures and Treatment: Consent by patient was obtained for treatment procedures.   Debridement of mycotic and hypertrophic toenails, 1 through 5 bilateral and clearing of subungual debris. No ulceration, no infection noted.   Padding applied. Return Visit-Office Procedure: Patient instructed to return to the office for a follow up visit 3 months for continued evaluation and treatment.    Gardiner Barefoot DPM

## 2019-04-25 DIAGNOSIS — I10 Essential (primary) hypertension: Secondary | ICD-10-CM | POA: Diagnosis not present

## 2019-04-25 DIAGNOSIS — F329 Major depressive disorder, single episode, unspecified: Secondary | ICD-10-CM | POA: Diagnosis not present

## 2019-04-25 DIAGNOSIS — E7800 Pure hypercholesterolemia, unspecified: Secondary | ICD-10-CM | POA: Diagnosis not present

## 2019-04-25 DIAGNOSIS — E785 Hyperlipidemia, unspecified: Secondary | ICD-10-CM | POA: Diagnosis not present

## 2019-04-25 DIAGNOSIS — N183 Chronic kidney disease, stage 3 (moderate): Secondary | ICD-10-CM | POA: Diagnosis not present

## 2019-04-25 DIAGNOSIS — I129 Hypertensive chronic kidney disease with stage 1 through stage 4 chronic kidney disease, or unspecified chronic kidney disease: Secondary | ICD-10-CM | POA: Diagnosis not present

## 2019-04-25 DIAGNOSIS — N400 Enlarged prostate without lower urinary tract symptoms: Secondary | ICD-10-CM | POA: Diagnosis not present

## 2019-04-25 DIAGNOSIS — I2510 Atherosclerotic heart disease of native coronary artery without angina pectoris: Secondary | ICD-10-CM | POA: Diagnosis not present

## 2019-05-08 ENCOUNTER — Telehealth: Payer: Self-pay | Admitting: Cardiovascular Disease

## 2019-05-08 NOTE — Telephone Encounter (Signed)
New message    Patient states that on 05/05/2019 he had a sharp pain below his should to his waist. Patient states that he has no other symptoms on today but would like a call to discuss this.

## 2019-05-08 NOTE — Telephone Encounter (Signed)
Called patient back about his message. Patient complaining of having a sharp pain that ran from his right shoulder to his waist that lasted 2 seconds while he was walking with his walker on Friday. Patient denies any SOB or Chest pain. Patient stated he did not have any other symptoms, and now he is walking slower so it does not happen again. Patient stated he is afraid he had a heart attach. Informed patient that it does not sound like a heart attack, but we will let Dr. Angelena Form know. Asked patient if he has called his PCP about this issue, Patient stated no that he wanted to tell Dr. Angelena Form first. Patient thanks he might need to be seen sooner that later. Will send message to Dr. Angelena Form for further advisement.

## 2019-05-08 NOTE — Telephone Encounter (Signed)
New Message      Friday evening pt says he was walking around and out of no where he got a severe pain that was in the front on the right. He said it was extremely painful. He said he stopped and it went away.  Pt says it is not bothering him anymore and he feels okay but he wants to get checked out   Please call

## 2019-05-08 NOTE — Telephone Encounter (Signed)
See previous note on 05/08/19

## 2019-05-08 NOTE — Telephone Encounter (Signed)
Left message for patient to call back  

## 2019-05-09 ENCOUNTER — Telehealth: Payer: Self-pay | Admitting: Cardiovascular Disease

## 2019-05-09 ENCOUNTER — Telehealth (INDEPENDENT_AMBULATORY_CARE_PROVIDER_SITE_OTHER): Payer: Medicare Other | Admitting: Physician Assistant

## 2019-05-09 ENCOUNTER — Other Ambulatory Visit: Payer: Self-pay

## 2019-05-09 ENCOUNTER — Encounter: Payer: Self-pay | Admitting: Physician Assistant

## 2019-05-09 VITALS — BP 138/72 | Ht 64.0 in | Wt 126.0 lb

## 2019-05-09 DIAGNOSIS — R0789 Other chest pain: Secondary | ICD-10-CM | POA: Diagnosis not present

## 2019-05-09 DIAGNOSIS — I2510 Atherosclerotic heart disease of native coronary artery without angina pectoris: Secondary | ICD-10-CM

## 2019-05-09 DIAGNOSIS — I10 Essential (primary) hypertension: Secondary | ICD-10-CM

## 2019-05-09 DIAGNOSIS — Z7189 Other specified counseling: Secondary | ICD-10-CM | POA: Diagnosis not present

## 2019-05-09 DIAGNOSIS — I340 Nonrheumatic mitral (valve) insufficiency: Secondary | ICD-10-CM | POA: Diagnosis not present

## 2019-05-09 NOTE — Progress Notes (Signed)
Virtual Visit via Telephone Note   This visit type was conducted due to national recommendations for restrictions regarding the COVID-19 Pandemic (e.g. social distancing) in an effort to limit this patient's exposure and mitigate transmission in our community.  Due to his co-morbid illnesses, this patient is at least at moderate risk for complications without adequate follow up.  This format is felt to be most appropriate for this patient at this time.  The patient did not have access to video technology/had technical difficulties with video requiring transitioning to audio format only (telephone).  All issues noted in this document were discussed and addressed.  No physical exam could be performed with this format.  Please refer to the patient's chart for his  consent to telehealth for [facility].   Date:  05/09/2019   ID:  Rosilyn Mings, DOB 01/30/1921, MRN [id number]  Patient Location: Other:  [place] Provider Location: Home  PCP:  Seward Carol, MD  Cardiologist:  Lauree Chandler, MD   Electrophysiologist:  None   Evaluation Performed:  Follow-Up Visit  Chief Complaint:  Chest pain  History of Present Illness:    Larry Mcfarland is a 86 y.o. male with chronic kidney disease, paroxysmal atrial fibrillation, severe mitral regurgitation, nonobstructive coronary artery disease by cardiac catheterization in 2003, hypertension, hyperlipidemia, acid reflux.  He was initially referred to Dr. Angelena Form from Dr. Johnsie Cancel in 2016 to discuss management of his severe mitral regurgitation.  Conservative management was chosen.  His last echo in 2017 demonstrated a PASP of 56 with severe mitral regurgitation.  There was probable severe flail motion involving the lateral scallop of the posterior mitral valve leaflet due to ruptured 1 or more cords.  He was last evaluated by Dr. Angelena Form in 12/2018.    The patient does not have symptoms concerning for COVID-19 infection  (fever, chills, cough, or new shortness of breath).   He called in yesterday with c/o chest pain and was added on to my schedule.  He was out walking last Thursday using his walker.  He suddenly had a right-sided chest discomfort that felt like a knife sticking in him.  It lasted 2 to 3 seconds and then resolved.  He had no associated symptoms.  He has not had any pleuritic chest discomfort, chest discomfort with lying supine or a ripping/tearing sensation.  His blood pressure today was optimal.  He has not had any significant shortness of breath.  He has not had any recurrent chest symptoms.  He has not had any further exertional symptoms.  He has not been dizzy or had syncope.  He has not had orthopnea, paroxysmal tunnel dyspnea or lower extremity swelling.  He has not had any fever or cough or symptoms of acid reflux.  Past Medical History:  Diagnosis Date   Anginal pain ([place])    Anxiety    Bleeding from the nose 2009   cautery by Dr. Erik Obey   BPH (benign prostatic hyperplasia)    CAD (coronary artery disease)    Cataract    right eye   CKD (chronic kidney disease), stage III ([place])    Distal radius fracture 2013   Dr. Fredna Dow   Dysrhythmia    hx of atrial fib- 10 years ago    GERD (gastroesophageal reflux disease)    Gout    H/O hiatal hernia    Hyperlipemia    Hypertension    Impaired fasting glucose    Inguinal hernia without mention of obstruction  or gangrene, unilateral or unspecified, (not specified as recurrent)    Lumbar radiculopathy    Major depressive disorder, single episode, unspecified    Nosebleed    most recent 05/24/13  to have  cauterized prior to surgery    Osteoarthritis    Osteopenia 2002   Skin cancer of forehead    left (BCE)   Vertigo 1999   negative ct scan head; rare recurrance, last in 2013   Vision disturbance    Past Surgical History:  Procedure Laterality Date   ESOPHAGOGASTRODUODENOSCOPY N/A 07/10/2014   Procedure:  ESOPHAGOGASTRODUODENOSCOPY (EGD);  Surgeon: Garlan Fair, MD;  Location: [facility];  Service: Endoscopy;  Laterality: N/A;   EYE SURGERY     right eye cataract surgery    GASTROSTOMY N/A 10/03/2014   Procedure: GASTROSTOMY TUBE;  Surgeon: Fanny Skates, MD;  Location: [facility];  Service: General;  Laterality: N/A;   HIATAL HERNIA REPAIR N/A 10/03/2014   Procedure: [place], POSSIBLE OPEN;  Surgeon: Fanny Skates, MD;  Location: [facility];  Service: General;  Laterality: N/A;   INGUINAL HERNIA REPAIR Right 06/07/2013   Procedure: LAPAROSCOPIC INGUINAL HERNIA REPAIR;  Surgeon: Adin Hector, MD;  Location: [facility];  Service: General;  Laterality: Right;  With Mesh   INSERTION OF MESH Right 06/07/2013   Procedure: INSERTION OF MESH;  Surgeon: Adin Hector, MD;  Location: [facility];  Service: General;  Laterality: Right;   TONSILLECTOMY     at age 50 yrs old     Current Meds  Medication Sig   ALPRAZolam (XANAX) 0.25 MG tablet Take 0.125 mg by mouth 2 (two) times daily as needed for anxiety or sleep.    aspirin 81 MG tablet Take 81 mg by mouth daily.   meclizine (ANTIVERT) 12.5 MG tablet Take 1 tablet (12.5 mg total) by mouth 3 (three) times daily as needed for dizziness (Vertigo).   metoprolol tartrate (LOPRESSOR) 25 MG tablet Take 12.5 mg by mouth 2 (two) times daily.   omeprazole (PRILOSEC) 20 MG capsule Take 40 mg by mouth daily before breakfast.    polyethylene glycol (MIRALAX / GLYCOLAX) packet Take 17 g by mouth every other day.   pravastatin (PRAVACHOL) 20 MG tablet Take 20 mg by mouth at bedtime.   tamsulosin (FLOMAX) 0.4 MG CAPS capsule Take 0.4 mg by mouth daily after supper.     Allergies:   Milk-related compounds and Penicillins   Social History   Tobacco Use   Smoking status: Never Smoker   Smokeless tobacco: Never Used  Substance Use Topics   Alcohol use: Yes    Comment: occasional   Drug use: No     Family Hx: The  patient's family history includes Cancer in his sister; Heart disease in his father and mother.  ROS:   Please see the history of present illness.     All other systems reviewed and are negative.   Prior CV studies:   The following studies were reviewed today:  Carotid US 11/23/2017 Bilateral ICA 1-39  Echocardiogram 08/24/2016 Moderate concentric LVH, EF 60-65, normal wall motion, grade 2 diastolic dysfunction, trivial AI, probable severe flail motion involving the lateral scallop of the posterior mitral valve leaflet due to rupture 1 more cords, severe MR, moderate LAE, mild to moderate TR, PASP 56  Cardiac catheterization 04/19/2002 LM possible 20 LAD irregularities OM 1 ostial 50-60 RCA diffuse 40 EF 65  Labs/Other Tests and Data Reviewed:    EKG:  An  ECG dated 2.27.2020 was personally reviewed today and demonstrated:  Sinus bradycardia, heart rate 58, normal axis, nonspecific ST-T wave changes, QTC 434  Recent Labs: 01/26/2019: ALT 17; BUN 33; Creatinine, Ser 1.92; Hemoglobin 10.1; Platelets 150; Potassium 4.1; Sodium 137   Recent Lipid Panel Lab Results  Component Value Date/Time   CHOL 160 11/22/2017 02:57 PM   TRIG 111 11/22/2017 02:57 PM   HDL 66 11/22/2017 02:57 PM   CHOLHDL 2.4 11/22/2017 02:57 PM   LDLCALC 72 11/22/2017 02:57 PM    Wt Readings from Last 3 Encounters:  05/09/19 126 lb (57.2 kg)  01/26/19 130 lb (59 kg)  01/23/19 134 lb (60.8 kg)     Objective:    Vital Signs:  BP 138/72    Ht 5\' 4"  (1.626 m)    Wt 126 lb (57.2 kg)    BMI 21.63 kg/m    VITAL SIGNS:  reviewed GEN:  no acute distress RESPIRATORY:  No labored breathing NEURO:  Alert and oriented PSYCH:  Mood his good  ASSESSMENT & PLAN:    Chest pain His chest discomfort is atypical for ischemia.  I suspect he had either indigestion or musculoskeletal pain.  He has not had a recurrence.  He does describe feeling tired recently.  However, this has not gotten any worse.  He is being  managed conservatively as it relates to his mitral regurgitation.  I considered whether or not to proceed with an echocardiogram.  This would not change his management any further at this time.  However, if he does have recurrent symptoms, we could proceed with an echocardiogram to assess for wall motion abnormalities and initiation of an antianginal program.  I have provided him reassurance today.  If he has recurrent symptoms he knows to contact us so that we can proceed with earlier follow-up.  If he does have severe symptoms that are prolonged, he knows to call 911.  Severe mitral regurgitation This has been managed conservatively.  Continue follow-up with Dr. Angelena Form as planned.  Coronary artery disease Moderate nonobstructive disease by cardiac catheterization in 2003.  Continue aspirin, statin.  Hypertension The patient's blood pressure is controlled on his current regimen.  Continue current therapy.   COVID-19 Education: The signs and symptoms of COVID-19 were discussed with the patient and how to seek care for testing (follow up with PCP or arrange E-visit).  The importance of social distancing was discussed today.  Time:   Today, I have spent 25 minutes with the patient with telehealth technology discussing the above problems.     Medication Adjustments/Labs and Tests Ordered: Current medicines are reviewed at length with the patient today.  Concerns regarding medicines are outlined above.   Tests Ordered: No orders of the defined types were placed in this encounter.   Medication Changes: No orders of the defined types were placed in this encounter.   Disposition:  Follow up in 2 month(s) as planned with Dr. Lauree Chandler   Signed, Richardson Dopp, PA-C  05/09/2019 4:03 PM    [place]

## 2019-05-09 NOTE — Telephone Encounter (Signed)
Virtual Visit Pre-Appointment Phone Call  Call placed to Pt.  Pt would like to discuss his symptoms via phone call with SW  1. "What is the BEST phone number to call the day of the visit?" - see appt notes  2. Do you have or have access to (through a family member/friend) a smartphone with video capability that we can use for your visit?" a. If yes - list this number in appt notes as cell (if different from BEST phone #) and list the appointment type as a VIDEO visit in appointment notes b. If no - list the appointment type as a PHONE visit in appointment notes  3. Confirm consent - "In the setting of the current Covid19 crisis, you are scheduled for a phone visit with your provider on today at 12:15 pm.  Just as we do with many in-office visits, in order for you to participate in this visit, we must obtain consent.  If you'd like, I can send this to your mychart (if signed up) or email for you to review.  Otherwise, I can obtain your verbal consent now.  All virtual visits are billed to your insurance company just like a normal visit would be.  By agreeing to a virtual visit, we'd like you to understand that the technology does not allow for your provider to perform an examination, and thus may limit your provider's ability to fully assess your condition. If your provider identifies any concerns that need to be evaluated in person, we will make arrangements to do so.  Finally, though the technology is pretty good, we cannot assure that it will always work on either your or our end, and in the setting of a video visit, we may have to convert it to a phone-only visit.  In either situation, we cannot ensure that we have a secure connection.  Are you willing to proceed?"yes Advise patient to be prepared - "Two hours prior to your appointment, go ahead and check your blood pressure, pulse, oxygen saturation, and your weight (if you have the equipment to check those) and write them all down. When your  visit starts, your provider will ask you for this information. If you have an Apple Watch or Kardia device, please plan to have heart rate information ready on the day of your appointment. Please have a pen and paper handy nearby the day of the visit as well."  4. Give patient instructions for MyChart download to smartphone OR [email or web address] as below if video visit (depending on what platform provider is using)  5. Inform patient they will receive a phone call 15 minutes prior to their appointment time (may be from unknown caller ID) so they should be prepared to answer    TELEPHONE CALL NOTE  CLERENCE GUBSER has been deemed a candidate for a follow-up tele-health visit to limit community exposure during the Covid-19 pandemic. I spoke with the patient via phone to ensure availability of phone/video source, confirm preferred email & phone number, and discuss instructions and expectations.  I reminded JUDDSON COBERN to be prepared with any vital sign and/or heart rhythm information that could potentially be obtained via home monitoring, at the time of his visit. I reminded PANCHO RUSHING to expect a phone call prior to his visit.  Damian Leavell, RN 05/09/2019 10:08 AM   INSTRUCTIONS FOR DOWNLOADING THE MYCHART APP TO SMARTPHONE  - The patient must first make sure to have activated MyChart and know their login  information - If Apple, go to [employer] and type in MyChart in the search bar and download the app. If Android, ask patient to go to [employer] and type in [place] in the search bar and download the app. The app is free but as with any other app downloads, their phone may require them to verify saved payment information or Apple/Android password.  - The patient will need to then log into the app with their MyChart username and password, and select [facility] as their healthcare provider to link the account. When it is time for your visit, go to the MyChart app, find  appointments, and click Begin Video Visit. Be sure to Select Allow for your device to access the Microphone and Camera for your visit. You will then be connected, and your provider will be with you shortly.  **If they have any issues connecting, or need assistance please contact MyChart service desk [phone])**  **If using a computer, in order to ensure the best quality for their visit they will need to use either of the following Internet Browsers: [employer], or Google Chrome**  IF USING DOXIMITY or DOXY.ME - The patient will receive a link just prior to their visit by text.     FULL LENGTH CONSENT FOR TELE-HEALTH VISIT   I hereby voluntarily request, consent and authorize [facility] and its employed or contracted physicians, physician assistants, nurse practitioners or other licensed health care professionals (the Practitioner), to provide me with telemedicine health care services (the Services") as deemed necessary by the treating Practitioner. I acknowledge and consent to receive the Services by the Practitioner via telemedicine. I understand that the telemedicine visit will involve communicating with the Practitioner through live audiovisual communication technology and the disclosure of certain medical information by electronic transmission. I acknowledge that I have been given the opportunity to request an in-person assessment or other available alternative prior to the telemedicine visit and am voluntarily participating in the telemedicine visit.  I understand that I have the right to withhold or withdraw my consent to the use of telemedicine in the course of my care at any time, without affecting my right to future care or treatment, and that the Practitioner or I may terminate the telemedicine visit at any time. I understand that I have the right to inspect all information obtained and/or recorded in the course of the telemedicine visit and may receive copies of  available information for a reasonable fee.  I understand that some of the potential risks of receiving the Services via telemedicine include:   Delay or interruption in medical evaluation due to technological equipment failure or disruption;  Information transmitted may not be sufficient (e.g. poor resolution of images) to allow for appropriate medical decision making by the Practitioner; and/or   In rare instances, security protocols could fail, causing a breach of personal health information.  Furthermore, I acknowledge that it is my responsibility to provide information about my medical history, conditions and care that is complete and accurate to the best of my ability. I acknowledge that Practitioner's advice, recommendations, and/or decision may be based on factors not within their control, such as incomplete or inaccurate data provided by me or distortions of diagnostic images or specimens that may result from electronic transmissions. I understand that the practice of medicine is not an exact science and that Practitioner makes no warranties or guarantees regarding treatment outcomes. I acknowledge that I will receive a copy of this consent concurrently upon  execution via email to the email address I last provided but may also request a printed copy by calling the office of [facility].    I understand that my insurance will be billed for this visit.   I have read or had this consent read to me.  I understand the contents of this consent, which adequately explains the benefits and risks of the Services being provided via telemedicine.   I have been provided ample opportunity to ask questions regarding this consent and the Services and have had my questions answered to my satisfaction.  I give my informed consent for the services to be provided through the use of telemedicine in my medical care  By participating in this telemedicine visit I agree to the above.

## 2019-05-09 NOTE — Patient Instructions (Signed)
Medication Instructions:  No changes.  If you need a refill on your cardiac medications before your next appointment, please call your pharmacy.   Lab work: None   If you have labs (blood work) drawn today and your tests are completely normal, you will receive your results only by: Marland Kitchen MyChart Message (if you have MyChart) OR . A paper copy in the mail If you have any lab test that is abnormal or we need to change your treatment, we will call you to review the results.  Testing/Procedures: None   Follow-Up: At [facility], you and your health needs are our priority.  As part of our continuing mission to provide you with exceptional heart care, we have created designated Provider Care Teams.  These Care Teams include your primary Cardiologist (physician) and Advanced Practice Providers (APPs -  Physician Assistants and Nurse Practitioners) who all work together to provide you with the care you need, when you need it. . Dr. Angelena Form in August 2020  Any Other Special Instructions Will Be Listed Below (If Applicable).  Call if you have any recurrent chest pain

## 2019-05-09 NOTE — Telephone Encounter (Signed)
New Message     Pt says he is having a pain from his shoulder to his stomach. He says he felt like someone was sticking him with a knife    Please call back

## 2019-05-09 NOTE — Telephone Encounter (Signed)
Returned call to Pt.  Per Pt he is doing fine right now but he feels like he would like to discuss his symptoms with someone.  Advised Pt he could discuss with SW at 12:15 pm via phone if he would like.  Pt would like phone visit to discuss his symptoms.  Asked Pt to get his BP and pulse and advised someone would call for that information and to go over his meds.  Pt indicates understanding.  States "he will be waiting by the phone".  Also states he has had some trouble with his phone but "keep trying".

## 2019-05-10 NOTE — Telephone Encounter (Signed)
Agree that this does not sound like a heart issue. Can we check on him today to see how he is feeling? I will likely not need to see him immediately unless he has had more symptoms. Thanks, chris

## 2019-07-05 ENCOUNTER — Ambulatory Visit (INDEPENDENT_AMBULATORY_CARE_PROVIDER_SITE_OTHER): Payer: Medicare Other | Admitting: Podiatry

## 2019-07-05 ENCOUNTER — Encounter: Payer: Self-pay | Admitting: Podiatry

## 2019-07-05 ENCOUNTER — Other Ambulatory Visit: Payer: Self-pay

## 2019-07-05 VITALS — Temp 97.6°F

## 2019-07-05 DIAGNOSIS — M2030 Hallux varus (acquired), unspecified foot: Secondary | ICD-10-CM | POA: Diagnosis not present

## 2019-07-05 DIAGNOSIS — B351 Tinea unguium: Secondary | ICD-10-CM

## 2019-07-05 DIAGNOSIS — M79676 Pain in unspecified toe(s): Secondary | ICD-10-CM | POA: Diagnosis not present

## 2019-07-05 NOTE — Progress Notes (Signed)
Complaint:  Visit Type: Patient returns to my office for continued preventative foot care services. Complaint: Patient states" my nails have grown long and thick and become painful to walk and wear shoes" The patient presents for preventative foot care services. No changes to ROS  Podiatric Exam: Vascular: dorsalis pedis and posterior tibial pulses are palpable bilateral. Capillary return is immediate. Temperature gradient is WNL. Skin turgor WNL  Sensorium: Normal Semmes Weinstein monofilament test. Normal tactile sensation bilaterally. Nail Exam: Pt has thick disfigured discolored nails with subungual debris noted bilateral entire nail hallux through fifth toenails Ulcer Exam: There is no evidence of ulcer or pre-ulcerative changes or infection. Orthopedic Exam: Muscle tone and strength are WNL. No limitations in general ROM. No crepitus or effusions noted. Plantar flexed 2,3  B/L.  Hammer toes  B/L. Skin: No Porokeratosis. No infection or ulcers  Diagnosis:  Onychomycosis, , Pain in right toe, pain in left toes  Treatment & Plan Procedures and Treatment: Consent by patient was obtained for treatment procedures.   Debridement of mycotic and hypertrophic toenails, 1 through 5 bilateral and clearing of subungual debris. No ulceration, no infection noted.   Padding applied. Return Visit-Office Procedure: Patient instructed to return to the office for a follow up visit 3 months for continued evaluation and treatment.    Gardiner Barefoot DPM

## 2019-07-10 DIAGNOSIS — R269 Unspecified abnormalities of gait and mobility: Secondary | ICD-10-CM | POA: Diagnosis not present

## 2019-07-10 DIAGNOSIS — E7800 Pure hypercholesterolemia, unspecified: Secondary | ICD-10-CM | POA: Diagnosis not present

## 2019-07-10 DIAGNOSIS — F419 Anxiety disorder, unspecified: Secondary | ICD-10-CM | POA: Diagnosis not present

## 2019-07-10 DIAGNOSIS — I340 Nonrheumatic mitral (valve) insufficiency: Secondary | ICD-10-CM | POA: Diagnosis not present

## 2019-07-10 DIAGNOSIS — I10 Essential (primary) hypertension: Secondary | ICD-10-CM | POA: Diagnosis not present

## 2019-08-10 ENCOUNTER — Telehealth (INDEPENDENT_AMBULATORY_CARE_PROVIDER_SITE_OTHER): Payer: Medicare Other | Admitting: Cardiovascular Disease

## 2019-08-10 ENCOUNTER — Encounter: Payer: Self-pay | Admitting: Cardiovascular Disease

## 2019-08-10 ENCOUNTER — Other Ambulatory Visit: Payer: Self-pay

## 2019-08-10 VITALS — Ht 64.0 in | Wt 125.0 lb

## 2019-08-10 DIAGNOSIS — I340 Nonrheumatic mitral (valve) insufficiency: Secondary | ICD-10-CM | POA: Diagnosis not present

## 2019-08-10 DIAGNOSIS — I2510 Atherosclerotic heart disease of native coronary artery without angina pectoris: Secondary | ICD-10-CM | POA: Diagnosis not present

## 2019-08-10 NOTE — Progress Notes (Signed)
Virtual Visit via Telephone Note   This visit type was conducted due to national recommendations for restrictions regarding the COVID-19 Pandemic (e.g. social distancing) in an effort to limit this patient's exposure and mitigate transmission in our community.  Due to his co-morbid illnesses, this patient is at least at moderate risk for complications without adequate follow up.  This format is felt to be most appropriate for this patient at this time.  All issues noted in this document were discussed and addressed.  A limited physical exam was performed with this format.  Please refer to the patient's chart for his consent to telehealth for [facility].   Date:  08/10/2019   ID:  Larry Mcfarland, DOB Jul 17, 1921, MRN [id number]  Patient Location: Home Provider Location: Office  PCP:  Seward Carol, MD  Cardiologist:  Lauree Chandler, MD  Electrophysiologist:  None   Evaluation Performed:  Follow-Up Visit  Chief Complaint:  Follow up- Severe mitral regurgitation  History of Present Illness:    Larry Mcfarland is a 85 y.o. male with history of chronic kidney disease, paroxysmal atrial fibrillation, GERD, HTN, HLD and severe mitral regurgitation who is being seen today by virtual e-visit due to the Covid19 pandemic He had been followed by Dr. Johnsie Cancel in our office. I saw him  in the valve clinic as a new patient in October 2016 to discuss his severe mitral regurgitation. 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 After a long discussion with the patient and his family, we elected for conservative management of his valve disease. He has remote atrial fibrillation but no recent recurrences. Echo September 2017 with normal LV systolic function, severe MR. PA pressure estimated at 56 mmHg.   The patient denies chest pain, dyspnea, palpitations, dizziness, near syncope or syncope. No lower extremity edema.   The patient does not  have symptoms concerning for COVID-19 infection (fever, chills, cough, or new shortness of breath).    Past Medical History:  Diagnosis Date  . Anginal pain ([place])   . Anxiety   . Bleeding from the nose 2009   cautery by Dr. Erik Obey  . BPH (benign prostatic hyperplasia)   . CAD (coronary artery disease)   . Cataract    right eye  . CKD (chronic kidney disease), stage III ([place])   . Distal radius fracture 2013   Dr. Fredna Dow  . Dysrhythmia    hx of atrial fib- 10 years ago   . GERD (gastroesophageal reflux disease)   . Gout   . H/O hiatal hernia   . Hyperlipemia   . Hypertension   . Impaired fasting glucose   . Inguinal hernia without mention of obstruction or gangrene, unilateral or unspecified, (not specified as recurrent)   . Lumbar radiculopathy   . Major depressive disorder, single episode, unspecified   . Nosebleed    most recent 05/24/13  to have  cauterized prior to surgery   . Osteoarthritis   . Osteopenia 2002  . Skin cancer of forehead    left (BCE)  . Vertigo 1999   negative ct scan head; rare recurrance, last in 2013  . Vision disturbance    Past Surgical History:  Procedure Laterality Date  . ESOPHAGOGASTRODUODENOSCOPY N/A 07/10/2014   Procedure: ESOPHAGOGASTRODUODENOSCOPY (EGD);  Surgeon: Garlan Fair, MD;  Location: [facility];  Service: Endoscopy;  Laterality: N/A;  . EYE SURGERY     right eye cataract surgery   . GASTROSTOMY  N/A 10/03/2014   Procedure: GASTROSTOMY TUBE;  Surgeon: Fanny Skates, MD;  Location: [facility];  Service: General;  Laterality: N/A;  . HIATAL HERNIA REPAIR N/A 10/03/2014   Procedure: [place], POSSIBLE OPEN;  Surgeon: Fanny Skates, MD;  Location: [facility];  Service: General;  Laterality: N/A;  . INGUINAL HERNIA REPAIR Right 06/07/2013   Procedure: LAPAROSCOPIC INGUINAL HERNIA REPAIR;  Surgeon: Adin Hector, MD;  Location: [facility];  Service: General;  Laterality: Right;  With Mesh  . INSERTION OF MESH  Right 06/07/2013   Procedure: INSERTION OF MESH;  Surgeon: Adin Hector, MD;  Location: [facility];  Service: General;  Laterality: Right;  . TONSILLECTOMY     at age 65 yrs old     Current Meds  Medication Sig  . ALPRAZolam (XANAX) 0.25 MG tablet Take 0.125 mg by mouth 2 (two) times daily as needed for anxiety or sleep.   Marland Kitchen aspirin 81 MG tablet Take 81 mg by mouth daily.  . meclizine (ANTIVERT) 12.5 MG tablet Take 1 tablet (12.5 mg total) by mouth 3 (three) times daily as needed for dizziness (Vertigo).  . metoprolol tartrate (LOPRESSOR) 25 MG tablet Take 12.5 mg by mouth 2 (two) times daily.  Marland Kitchen omeprazole (PRILOSEC) 20 MG capsule Take 40 mg by mouth daily before breakfast.   . polyethylene glycol (MIRALAX / GLYCOLAX) packet Take 17 g by mouth every other day.  . pravastatin (PRAVACHOL) 20 MG tablet Take 20 mg by mouth at bedtime.  . tamsulosin (FLOMAX) 0.4 MG CAPS capsule Take 0.4 mg by mouth daily after supper.     Allergies:   Milk-related compounds and Penicillins   Social History   Tobacco Use  . Smoking status: Never Smoker  . Smokeless tobacco: Never Used  Substance Use Topics  . Alcohol use: Yes    Comment: occasional  . Drug use: No     Family Hx: The patient's family history includes Cancer in his sister; Heart disease in his father and mother.  ROS:   Please see the history of present illness.    All other systems reviewed and are negative.   Prior CV studies:   The following studies were reviewed today:   Labs/Other Tests and Data Reviewed:    EKG:  No ECG reviewed.  Recent Labs: 01/26/2019: ALT 17; BUN 33; Creatinine, Ser 1.92; Hemoglobin 10.1; Platelets 150; Potassium 4.1; Sodium 137   Recent Lipid Panel Lab Results  Component Value Date/Time   CHOL 160 11/22/2017 02:57 PM   TRIG 111 11/22/2017 02:57 PM   HDL 66 11/22/2017 02:57 PM   CHOLHDL 2.4 11/22/2017 02:57 PM   LDLCALC 72 11/22/2017 02:57 PM    Wt Readings from Last 3 Encounters:  08/10/19  125 lb (56.7 kg)  05/09/19 126 lb (57.2 kg)  01/26/19 130 lb (59 kg)     Objective:    Vital Signs:  Ht 5\' 4"  (1.626 m)   Wt 125 lb (56.7 kg)   BMI 21.46 kg/m  BP 110/50  No exam due to virtual phone visit  ASSESSMENT & PLAN:    1. Severe mitral regurgitation: He has severe MR that is felt to be due to flail leaflet with a probable chord rupture. He continues to have fatigue but no evidence of volume overload. Will continue conservative approach. No signs of heart failure. Will not pursue evaluation for mitral clip procedure.    COVID-19 Education: The signs and symptoms of COVID-19 were discussed with  the patient and how to seek care for testing (follow up with PCP or arrange E-visit).  The importance of social distancing was discussed today.  Time:   Today, I have spent 15 minutes with the patient with telehealth technology discussing the above problems.     Medication Adjustments/Labs and Tests Ordered: Current medicines are reviewed at length with the patient today.  Concerns regarding medicines are outlined above.   Tests Ordered: No orders of the defined types were placed in this encounter.   Medication Changes: No orders of the defined types were placed in this encounter.   Disposition:  Follow up in 6 month(s)  Signed, Lauree Chandler, MD  08/10/2019 10:06 AM    [place]

## 2019-08-10 NOTE — Patient Instructions (Signed)
Medication Instructions:  Your provider recommends that you continue on your current medications as directed. Please refer to the Current Medication list given to you today.    Labwork: None  Testing/Procedures: None  Follow-Up: Your provider wants you to follow-up in: 6 months with Dr. Angelena Form. You will receive a reminder letter in the mail two months in advance. If you don't receive a letter, please call our office to schedule the follow-up appointment.    Any Other Special Instructions Will Be Listed Below (If Applicable).     If you need a refill on your cardiac medications before your next appointment, please call your pharmacy.

## 2019-08-24 DIAGNOSIS — E7800 Pure hypercholesterolemia, unspecified: Secondary | ICD-10-CM | POA: Diagnosis not present

## 2019-08-24 DIAGNOSIS — I2510 Atherosclerotic heart disease of native coronary artery without angina pectoris: Secondary | ICD-10-CM | POA: Diagnosis not present

## 2019-08-24 DIAGNOSIS — F329 Major depressive disorder, single episode, unspecified: Secondary | ICD-10-CM | POA: Diagnosis not present

## 2019-08-24 DIAGNOSIS — N183 Chronic kidney disease, stage 3 (moderate): Secondary | ICD-10-CM | POA: Diagnosis not present

## 2019-08-24 DIAGNOSIS — I129 Hypertensive chronic kidney disease with stage 1 through stage 4 chronic kidney disease, or unspecified chronic kidney disease: Secondary | ICD-10-CM | POA: Diagnosis not present

## 2019-08-24 DIAGNOSIS — N400 Enlarged prostate without lower urinary tract symptoms: Secondary | ICD-10-CM | POA: Diagnosis not present

## 2019-08-24 DIAGNOSIS — I10 Essential (primary) hypertension: Secondary | ICD-10-CM | POA: Diagnosis not present

## 2019-08-24 DIAGNOSIS — E785 Hyperlipidemia, unspecified: Secondary | ICD-10-CM | POA: Diagnosis not present

## 2019-09-29 DIAGNOSIS — R41841 Cognitive communication deficit: Secondary | ICD-10-CM | POA: Diagnosis not present

## 2019-10-02 DIAGNOSIS — R262 Difficulty in walking, not elsewhere classified: Secondary | ICD-10-CM | POA: Diagnosis not present

## 2019-10-02 DIAGNOSIS — R2681 Unsteadiness on feet: Secondary | ICD-10-CM | POA: Diagnosis not present

## 2019-10-02 DIAGNOSIS — R41841 Cognitive communication deficit: Secondary | ICD-10-CM | POA: Diagnosis not present

## 2019-10-02 DIAGNOSIS — M545 Low back pain: Secondary | ICD-10-CM | POA: Diagnosis not present

## 2019-10-04 ENCOUNTER — Other Ambulatory Visit: Payer: Self-pay

## 2019-10-04 ENCOUNTER — Ambulatory Visit (INDEPENDENT_AMBULATORY_CARE_PROVIDER_SITE_OTHER): Payer: Medicare Other | Admitting: Podiatry

## 2019-10-04 ENCOUNTER — Encounter: Payer: Self-pay | Admitting: Podiatry

## 2019-10-04 DIAGNOSIS — M2030 Hallux varus (acquired), unspecified foot: Secondary | ICD-10-CM

## 2019-10-04 DIAGNOSIS — M79676 Pain in unspecified toe(s): Secondary | ICD-10-CM

## 2019-10-04 DIAGNOSIS — M79609 Pain in unspecified limb: Secondary | ICD-10-CM

## 2019-10-04 DIAGNOSIS — B351 Tinea unguium: Secondary | ICD-10-CM | POA: Diagnosis not present

## 2019-10-04 NOTE — Progress Notes (Signed)
Complaint:  Visit Type: Patient returns to my office for continued preventative foot care services. Complaint: Patient states" my nails have grown long and thick and become painful to walk and wear shoes" The patient presents for preventative foot care services. No changes to ROS  Podiatric Exam: Vascular: dorsalis pedis and posterior tibial pulses are palpable bilateral. Capillary return is immediate. Temperature gradient is WNL. Skin turgor WNL  Sensorium: Normal Semmes Weinstein monofilament test. Normal tactile sensation bilaterally. Nail Exam: Pt has thick disfigured discolored nails with subungual debris noted bilateral entire nail hallux through fifth toenails Ulcer Exam: There is no evidence of ulcer or pre-ulcerative changes or infection. Orthopedic Exam: Muscle tone and strength are WNL. No limitations in general ROM. No crepitus or effusions noted. Plantar flexed 2,3  B/L.  Hammer toes  B/L. Skin: No Porokeratosis. No infection or ulcers.  Asymptomatic clavi  B/L.  Diagnosis:  Onychomycosis, , Pain in right toe, pain in left toes  Treatment & Plan Procedures and Treatment: Consent by patient was obtained for treatment procedures.   Debridement of mycotic and hypertrophic toenails, 1 through 5 bilateral and clearing of subungual debris. No ulceration, no infection noted.   Padding applied. Return Visit-Office Procedure: Patient instructed to return to the office for a follow up visit 3 months for continued evaluation and treatment.    Gardiner Barefoot DPM

## 2019-10-05 DIAGNOSIS — M545 Low back pain: Secondary | ICD-10-CM | POA: Diagnosis not present

## 2019-10-05 DIAGNOSIS — R2681 Unsteadiness on feet: Secondary | ICD-10-CM | POA: Diagnosis not present

## 2019-10-05 DIAGNOSIS — R41841 Cognitive communication deficit: Secondary | ICD-10-CM | POA: Diagnosis not present

## 2019-10-05 DIAGNOSIS — R262 Difficulty in walking, not elsewhere classified: Secondary | ICD-10-CM | POA: Diagnosis not present

## 2019-10-06 DIAGNOSIS — R262 Difficulty in walking, not elsewhere classified: Secondary | ICD-10-CM | POA: Diagnosis not present

## 2019-10-06 DIAGNOSIS — R41841 Cognitive communication deficit: Secondary | ICD-10-CM | POA: Diagnosis not present

## 2019-10-06 DIAGNOSIS — M545 Low back pain: Secondary | ICD-10-CM | POA: Diagnosis not present

## 2019-10-06 DIAGNOSIS — R2681 Unsteadiness on feet: Secondary | ICD-10-CM | POA: Diagnosis not present

## 2019-10-09 DIAGNOSIS — R41841 Cognitive communication deficit: Secondary | ICD-10-CM | POA: Diagnosis not present

## 2019-10-09 DIAGNOSIS — R262 Difficulty in walking, not elsewhere classified: Secondary | ICD-10-CM | POA: Diagnosis not present

## 2019-10-09 DIAGNOSIS — M545 Low back pain: Secondary | ICD-10-CM | POA: Diagnosis not present

## 2019-10-09 DIAGNOSIS — R2681 Unsteadiness on feet: Secondary | ICD-10-CM | POA: Diagnosis not present

## 2019-10-11 DIAGNOSIS — R2681 Unsteadiness on feet: Secondary | ICD-10-CM | POA: Diagnosis not present

## 2019-10-11 DIAGNOSIS — R262 Difficulty in walking, not elsewhere classified: Secondary | ICD-10-CM | POA: Diagnosis not present

## 2019-10-11 DIAGNOSIS — R41841 Cognitive communication deficit: Secondary | ICD-10-CM | POA: Diagnosis not present

## 2019-10-11 DIAGNOSIS — M545 Low back pain: Secondary | ICD-10-CM | POA: Diagnosis not present

## 2019-10-13 DIAGNOSIS — M545 Low back pain: Secondary | ICD-10-CM | POA: Diagnosis not present

## 2019-10-13 DIAGNOSIS — R41841 Cognitive communication deficit: Secondary | ICD-10-CM | POA: Diagnosis not present

## 2019-10-13 DIAGNOSIS — R2681 Unsteadiness on feet: Secondary | ICD-10-CM | POA: Diagnosis not present

## 2019-10-13 DIAGNOSIS — R262 Difficulty in walking, not elsewhere classified: Secondary | ICD-10-CM | POA: Diagnosis not present

## 2019-10-16 DIAGNOSIS — R2681 Unsteadiness on feet: Secondary | ICD-10-CM | POA: Diagnosis not present

## 2019-10-16 DIAGNOSIS — R262 Difficulty in walking, not elsewhere classified: Secondary | ICD-10-CM | POA: Diagnosis not present

## 2019-10-16 DIAGNOSIS — M545 Low back pain: Secondary | ICD-10-CM | POA: Diagnosis not present

## 2019-10-16 DIAGNOSIS — R41841 Cognitive communication deficit: Secondary | ICD-10-CM | POA: Diagnosis not present

## 2019-10-18 DIAGNOSIS — R41841 Cognitive communication deficit: Secondary | ICD-10-CM | POA: Diagnosis not present

## 2019-10-18 DIAGNOSIS — M545 Low back pain: Secondary | ICD-10-CM | POA: Diagnosis not present

## 2019-10-18 DIAGNOSIS — R262 Difficulty in walking, not elsewhere classified: Secondary | ICD-10-CM | POA: Diagnosis not present

## 2019-10-18 DIAGNOSIS — R2681 Unsteadiness on feet: Secondary | ICD-10-CM | POA: Diagnosis not present

## 2019-10-19 DIAGNOSIS — R2681 Unsteadiness on feet: Secondary | ICD-10-CM | POA: Diagnosis not present

## 2019-10-19 DIAGNOSIS — R262 Difficulty in walking, not elsewhere classified: Secondary | ICD-10-CM | POA: Diagnosis not present

## 2019-10-19 DIAGNOSIS — R41841 Cognitive communication deficit: Secondary | ICD-10-CM | POA: Diagnosis not present

## 2019-10-19 DIAGNOSIS — M545 Low back pain: Secondary | ICD-10-CM | POA: Diagnosis not present

## 2019-10-20 DIAGNOSIS — R2681 Unsteadiness on feet: Secondary | ICD-10-CM | POA: Diagnosis not present

## 2019-10-20 DIAGNOSIS — R41841 Cognitive communication deficit: Secondary | ICD-10-CM | POA: Diagnosis not present

## 2019-10-20 DIAGNOSIS — R262 Difficulty in walking, not elsewhere classified: Secondary | ICD-10-CM | POA: Diagnosis not present

## 2019-10-20 DIAGNOSIS — M545 Low back pain: Secondary | ICD-10-CM | POA: Diagnosis not present

## 2019-10-23 DIAGNOSIS — R2681 Unsteadiness on feet: Secondary | ICD-10-CM | POA: Diagnosis not present

## 2019-10-23 DIAGNOSIS — M545 Low back pain: Secondary | ICD-10-CM | POA: Diagnosis not present

## 2019-10-23 DIAGNOSIS — R41841 Cognitive communication deficit: Secondary | ICD-10-CM | POA: Diagnosis not present

## 2019-10-23 DIAGNOSIS — R262 Difficulty in walking, not elsewhere classified: Secondary | ICD-10-CM | POA: Diagnosis not present

## 2019-10-24 DIAGNOSIS — R2681 Unsteadiness on feet: Secondary | ICD-10-CM | POA: Diagnosis not present

## 2019-10-24 DIAGNOSIS — M545 Low back pain: Secondary | ICD-10-CM | POA: Diagnosis not present

## 2019-10-24 DIAGNOSIS — R41841 Cognitive communication deficit: Secondary | ICD-10-CM | POA: Diagnosis not present

## 2019-10-24 DIAGNOSIS — R262 Difficulty in walking, not elsewhere classified: Secondary | ICD-10-CM | POA: Diagnosis not present

## 2019-10-25 DIAGNOSIS — R262 Difficulty in walking, not elsewhere classified: Secondary | ICD-10-CM | POA: Diagnosis not present

## 2019-10-25 DIAGNOSIS — M545 Low back pain: Secondary | ICD-10-CM | POA: Diagnosis not present

## 2019-10-25 DIAGNOSIS — R2681 Unsteadiness on feet: Secondary | ICD-10-CM | POA: Diagnosis not present

## 2019-10-25 DIAGNOSIS — R41841 Cognitive communication deficit: Secondary | ICD-10-CM | POA: Diagnosis not present

## 2019-10-30 DIAGNOSIS — M545 Low back pain: Secondary | ICD-10-CM | POA: Diagnosis not present

## 2019-10-30 DIAGNOSIS — R41841 Cognitive communication deficit: Secondary | ICD-10-CM | POA: Diagnosis not present

## 2019-10-30 DIAGNOSIS — R2681 Unsteadiness on feet: Secondary | ICD-10-CM | POA: Diagnosis not present

## 2019-10-30 DIAGNOSIS — R262 Difficulty in walking, not elsewhere classified: Secondary | ICD-10-CM | POA: Diagnosis not present

## 2019-12-04 DIAGNOSIS — M545 Low back pain: Secondary | ICD-10-CM | POA: Diagnosis not present

## 2019-12-04 DIAGNOSIS — R262 Difficulty in walking, not elsewhere classified: Secondary | ICD-10-CM | POA: Diagnosis not present

## 2019-12-04 DIAGNOSIS — R41841 Cognitive communication deficit: Secondary | ICD-10-CM | POA: Diagnosis not present

## 2019-12-04 DIAGNOSIS — R2681 Unsteadiness on feet: Secondary | ICD-10-CM | POA: Diagnosis not present

## 2019-12-04 DIAGNOSIS — R488 Other symbolic dysfunctions: Secondary | ICD-10-CM | POA: Diagnosis not present

## 2019-12-05 DIAGNOSIS — R41841 Cognitive communication deficit: Secondary | ICD-10-CM | POA: Diagnosis not present

## 2019-12-05 DIAGNOSIS — R2681 Unsteadiness on feet: Secondary | ICD-10-CM | POA: Diagnosis not present

## 2019-12-05 DIAGNOSIS — R488 Other symbolic dysfunctions: Secondary | ICD-10-CM | POA: Diagnosis not present

## 2019-12-05 DIAGNOSIS — M545 Low back pain: Secondary | ICD-10-CM | POA: Diagnosis not present

## 2019-12-05 DIAGNOSIS — R262 Difficulty in walking, not elsewhere classified: Secondary | ICD-10-CM | POA: Diagnosis not present

## 2019-12-06 DIAGNOSIS — R262 Difficulty in walking, not elsewhere classified: Secondary | ICD-10-CM | POA: Diagnosis not present

## 2019-12-06 DIAGNOSIS — M545 Low back pain: Secondary | ICD-10-CM | POA: Diagnosis not present

## 2019-12-06 DIAGNOSIS — R2681 Unsteadiness on feet: Secondary | ICD-10-CM | POA: Diagnosis not present

## 2019-12-06 DIAGNOSIS — R41841 Cognitive communication deficit: Secondary | ICD-10-CM | POA: Diagnosis not present

## 2019-12-06 DIAGNOSIS — R488 Other symbolic dysfunctions: Secondary | ICD-10-CM | POA: Diagnosis not present

## 2019-12-07 DIAGNOSIS — R2681 Unsteadiness on feet: Secondary | ICD-10-CM | POA: Diagnosis not present

## 2019-12-07 DIAGNOSIS — R262 Difficulty in walking, not elsewhere classified: Secondary | ICD-10-CM | POA: Diagnosis not present

## 2019-12-07 DIAGNOSIS — M545 Low back pain: Secondary | ICD-10-CM | POA: Diagnosis not present

## 2019-12-07 DIAGNOSIS — R488 Other symbolic dysfunctions: Secondary | ICD-10-CM | POA: Diagnosis not present

## 2019-12-07 DIAGNOSIS — R41841 Cognitive communication deficit: Secondary | ICD-10-CM | POA: Diagnosis not present

## 2019-12-08 DIAGNOSIS — R2681 Unsteadiness on feet: Secondary | ICD-10-CM | POA: Diagnosis not present

## 2019-12-08 DIAGNOSIS — R41841 Cognitive communication deficit: Secondary | ICD-10-CM | POA: Diagnosis not present

## 2019-12-08 DIAGNOSIS — M545 Low back pain: Secondary | ICD-10-CM | POA: Diagnosis not present

## 2019-12-08 DIAGNOSIS — R488 Other symbolic dysfunctions: Secondary | ICD-10-CM | POA: Diagnosis not present

## 2019-12-08 DIAGNOSIS — R262 Difficulty in walking, not elsewhere classified: Secondary | ICD-10-CM | POA: Diagnosis not present

## 2019-12-11 DIAGNOSIS — R262 Difficulty in walking, not elsewhere classified: Secondary | ICD-10-CM | POA: Diagnosis not present

## 2019-12-11 DIAGNOSIS — M545 Low back pain: Secondary | ICD-10-CM | POA: Diagnosis not present

## 2019-12-11 DIAGNOSIS — R2681 Unsteadiness on feet: Secondary | ICD-10-CM | POA: Diagnosis not present

## 2019-12-11 DIAGNOSIS — R41841 Cognitive communication deficit: Secondary | ICD-10-CM | POA: Diagnosis not present

## 2019-12-11 DIAGNOSIS — R488 Other symbolic dysfunctions: Secondary | ICD-10-CM | POA: Diagnosis not present

## 2019-12-12 DIAGNOSIS — R488 Other symbolic dysfunctions: Secondary | ICD-10-CM | POA: Diagnosis not present

## 2019-12-12 DIAGNOSIS — R2681 Unsteadiness on feet: Secondary | ICD-10-CM | POA: Diagnosis not present

## 2019-12-12 DIAGNOSIS — R262 Difficulty in walking, not elsewhere classified: Secondary | ICD-10-CM | POA: Diagnosis not present

## 2019-12-12 DIAGNOSIS — R41841 Cognitive communication deficit: Secondary | ICD-10-CM | POA: Diagnosis not present

## 2019-12-12 DIAGNOSIS — M545 Low back pain: Secondary | ICD-10-CM | POA: Diagnosis not present

## 2019-12-13 DIAGNOSIS — R2681 Unsteadiness on feet: Secondary | ICD-10-CM | POA: Diagnosis not present

## 2019-12-13 DIAGNOSIS — R262 Difficulty in walking, not elsewhere classified: Secondary | ICD-10-CM | POA: Diagnosis not present

## 2019-12-13 DIAGNOSIS — M545 Low back pain: Secondary | ICD-10-CM | POA: Diagnosis not present

## 2019-12-13 DIAGNOSIS — R41841 Cognitive communication deficit: Secondary | ICD-10-CM | POA: Diagnosis not present

## 2019-12-13 DIAGNOSIS — R488 Other symbolic dysfunctions: Secondary | ICD-10-CM | POA: Diagnosis not present

## 2019-12-14 DIAGNOSIS — R2681 Unsteadiness on feet: Secondary | ICD-10-CM | POA: Diagnosis not present

## 2019-12-14 DIAGNOSIS — M545 Low back pain: Secondary | ICD-10-CM | POA: Diagnosis not present

## 2019-12-14 DIAGNOSIS — R41841 Cognitive communication deficit: Secondary | ICD-10-CM | POA: Diagnosis not present

## 2019-12-14 DIAGNOSIS — R262 Difficulty in walking, not elsewhere classified: Secondary | ICD-10-CM | POA: Diagnosis not present

## 2019-12-14 DIAGNOSIS — R488 Other symbolic dysfunctions: Secondary | ICD-10-CM | POA: Diagnosis not present

## 2019-12-18 DIAGNOSIS — R488 Other symbolic dysfunctions: Secondary | ICD-10-CM | POA: Diagnosis not present

## 2019-12-18 DIAGNOSIS — R2681 Unsteadiness on feet: Secondary | ICD-10-CM | POA: Diagnosis not present

## 2019-12-18 DIAGNOSIS — R262 Difficulty in walking, not elsewhere classified: Secondary | ICD-10-CM | POA: Diagnosis not present

## 2019-12-18 DIAGNOSIS — M545 Low back pain: Secondary | ICD-10-CM | POA: Diagnosis not present

## 2019-12-18 DIAGNOSIS — R41841 Cognitive communication deficit: Secondary | ICD-10-CM | POA: Diagnosis not present

## 2019-12-19 DIAGNOSIS — R262 Difficulty in walking, not elsewhere classified: Secondary | ICD-10-CM | POA: Diagnosis not present

## 2019-12-19 DIAGNOSIS — R41841 Cognitive communication deficit: Secondary | ICD-10-CM | POA: Diagnosis not present

## 2019-12-19 DIAGNOSIS — R488 Other symbolic dysfunctions: Secondary | ICD-10-CM | POA: Diagnosis not present

## 2019-12-19 DIAGNOSIS — R2681 Unsteadiness on feet: Secondary | ICD-10-CM | POA: Diagnosis not present

## 2019-12-19 DIAGNOSIS — M545 Low back pain: Secondary | ICD-10-CM | POA: Diagnosis not present

## 2019-12-20 DIAGNOSIS — R488 Other symbolic dysfunctions: Secondary | ICD-10-CM | POA: Diagnosis not present

## 2019-12-20 DIAGNOSIS — R41841 Cognitive communication deficit: Secondary | ICD-10-CM | POA: Diagnosis not present

## 2019-12-20 DIAGNOSIS — M545 Low back pain: Secondary | ICD-10-CM | POA: Diagnosis not present

## 2019-12-20 DIAGNOSIS — R2681 Unsteadiness on feet: Secondary | ICD-10-CM | POA: Diagnosis not present

## 2019-12-20 DIAGNOSIS — R262 Difficulty in walking, not elsewhere classified: Secondary | ICD-10-CM | POA: Diagnosis not present

## 2019-12-21 DIAGNOSIS — Z23 Encounter for immunization: Secondary | ICD-10-CM | POA: Diagnosis not present

## 2019-12-22 DIAGNOSIS — R2681 Unsteadiness on feet: Secondary | ICD-10-CM | POA: Diagnosis not present

## 2019-12-22 DIAGNOSIS — M545 Low back pain: Secondary | ICD-10-CM | POA: Diagnosis not present

## 2019-12-22 DIAGNOSIS — R262 Difficulty in walking, not elsewhere classified: Secondary | ICD-10-CM | POA: Diagnosis not present

## 2019-12-22 DIAGNOSIS — R488 Other symbolic dysfunctions: Secondary | ICD-10-CM | POA: Diagnosis not present

## 2019-12-22 DIAGNOSIS — R41841 Cognitive communication deficit: Secondary | ICD-10-CM | POA: Diagnosis not present

## 2019-12-25 DIAGNOSIS — R2681 Unsteadiness on feet: Secondary | ICD-10-CM | POA: Diagnosis not present

## 2019-12-25 DIAGNOSIS — R41841 Cognitive communication deficit: Secondary | ICD-10-CM | POA: Diagnosis not present

## 2019-12-25 DIAGNOSIS — M545 Low back pain: Secondary | ICD-10-CM | POA: Diagnosis not present

## 2019-12-25 DIAGNOSIS — R488 Other symbolic dysfunctions: Secondary | ICD-10-CM | POA: Diagnosis not present

## 2019-12-25 DIAGNOSIS — R262 Difficulty in walking, not elsewhere classified: Secondary | ICD-10-CM | POA: Diagnosis not present

## 2019-12-26 DIAGNOSIS — R262 Difficulty in walking, not elsewhere classified: Secondary | ICD-10-CM | POA: Diagnosis not present

## 2019-12-26 DIAGNOSIS — R41841 Cognitive communication deficit: Secondary | ICD-10-CM | POA: Diagnosis not present

## 2019-12-26 DIAGNOSIS — R2681 Unsteadiness on feet: Secondary | ICD-10-CM | POA: Diagnosis not present

## 2019-12-26 DIAGNOSIS — R488 Other symbolic dysfunctions: Secondary | ICD-10-CM | POA: Diagnosis not present

## 2019-12-26 DIAGNOSIS — M545 Low back pain: Secondary | ICD-10-CM | POA: Diagnosis not present

## 2019-12-28 DIAGNOSIS — R262 Difficulty in walking, not elsewhere classified: Secondary | ICD-10-CM | POA: Diagnosis not present

## 2019-12-28 DIAGNOSIS — M545 Low back pain: Secondary | ICD-10-CM | POA: Diagnosis not present

## 2019-12-28 DIAGNOSIS — R2681 Unsteadiness on feet: Secondary | ICD-10-CM | POA: Diagnosis not present

## 2019-12-28 DIAGNOSIS — R41841 Cognitive communication deficit: Secondary | ICD-10-CM | POA: Diagnosis not present

## 2019-12-28 DIAGNOSIS — R488 Other symbolic dysfunctions: Secondary | ICD-10-CM | POA: Diagnosis not present

## 2019-12-29 DIAGNOSIS — M545 Low back pain: Secondary | ICD-10-CM | POA: Diagnosis not present

## 2019-12-29 DIAGNOSIS — R41841 Cognitive communication deficit: Secondary | ICD-10-CM | POA: Diagnosis not present

## 2019-12-29 DIAGNOSIS — R488 Other symbolic dysfunctions: Secondary | ICD-10-CM | POA: Diagnosis not present

## 2019-12-29 DIAGNOSIS — R262 Difficulty in walking, not elsewhere classified: Secondary | ICD-10-CM | POA: Diagnosis not present

## 2019-12-29 DIAGNOSIS — R2681 Unsteadiness on feet: Secondary | ICD-10-CM | POA: Diagnosis not present

## 2020-01-01 DIAGNOSIS — Z1159 Encounter for screening for other viral diseases: Secondary | ICD-10-CM | POA: Diagnosis not present

## 2020-01-01 DIAGNOSIS — Z20828 Contact with and (suspected) exposure to other viral communicable diseases: Secondary | ICD-10-CM | POA: Diagnosis not present

## 2020-01-02 DIAGNOSIS — R2681 Unsteadiness on feet: Secondary | ICD-10-CM | POA: Diagnosis not present

## 2020-01-02 DIAGNOSIS — M545 Low back pain: Secondary | ICD-10-CM | POA: Diagnosis not present

## 2020-01-02 DIAGNOSIS — R262 Difficulty in walking, not elsewhere classified: Secondary | ICD-10-CM | POA: Diagnosis not present

## 2020-01-02 DIAGNOSIS — R41841 Cognitive communication deficit: Secondary | ICD-10-CM | POA: Diagnosis not present

## 2020-01-03 ENCOUNTER — Encounter: Payer: Self-pay | Admitting: Podiatry

## 2020-01-03 ENCOUNTER — Ambulatory Visit (INDEPENDENT_AMBULATORY_CARE_PROVIDER_SITE_OTHER): Payer: Medicare Other | Admitting: Podiatry

## 2020-01-03 ENCOUNTER — Other Ambulatory Visit: Payer: Self-pay

## 2020-01-03 DIAGNOSIS — M79609 Pain in unspecified limb: Secondary | ICD-10-CM | POA: Diagnosis not present

## 2020-01-03 DIAGNOSIS — R2681 Unsteadiness on feet: Secondary | ICD-10-CM | POA: Diagnosis not present

## 2020-01-03 DIAGNOSIS — M2040 Other hammer toe(s) (acquired), unspecified foot: Secondary | ICD-10-CM

## 2020-01-03 DIAGNOSIS — M545 Low back pain: Secondary | ICD-10-CM | POA: Diagnosis not present

## 2020-01-03 DIAGNOSIS — M2030 Hallux varus (acquired), unspecified foot: Secondary | ICD-10-CM

## 2020-01-03 DIAGNOSIS — R262 Difficulty in walking, not elsewhere classified: Secondary | ICD-10-CM | POA: Diagnosis not present

## 2020-01-03 DIAGNOSIS — B351 Tinea unguium: Secondary | ICD-10-CM | POA: Diagnosis not present

## 2020-01-03 DIAGNOSIS — R41841 Cognitive communication deficit: Secondary | ICD-10-CM | POA: Diagnosis not present

## 2020-01-03 NOTE — Progress Notes (Signed)
Complaint:  Visit Type: Patient returns to my office for continued preventative foot care services. Complaint: Patient states" my nails have grown long and thick and become painful to walk and wear shoes" The patient presents for preventative foot care services. No changes to ROS  Podiatric Exam: Vascular: dorsalis pedis and posterior tibial pulses are palpable bilateral. Capillary return is immediate. Temperature gradient is WNL. Skin turgor WNL  Sensorium: Normal Semmes Weinstein monofilament test. Normal tactile sensation bilaterally. Nail Exam: Pt has thick disfigured discolored nails with subungual debris noted bilateral entire nail hallux through fifth toenails Ulcer Exam: There is no evidence of ulcer or pre-ulcerative changes or infection. Orthopedic Exam: Muscle tone and strength are WNL. No limitations in general ROM. No crepitus or effusions noted. Plantar flexed 2,3  B/L.  Hammer toes  B/L. Skin: No Porokeratosis. No infection or ulcers.  Asymptomatic clavi  B/L.  Diagnosis:  Onychomycosis, , Pain in right toe, pain in left toes  Treatment & Plan Procedures and Treatment: Consent by patient was obtained for treatment procedures.   Debridement of mycotic and hypertrophic toenails, 1 through 5 bilateral and clearing of subungual debris. No ulceration, no infection noted.   Padding applied. Return Visit-Office Procedure: Patient instructed to return to the office for a follow up visit 3 months for continued evaluation and treatment.    Gardiner Barefoot DPM

## 2020-01-04 DIAGNOSIS — R41841 Cognitive communication deficit: Secondary | ICD-10-CM | POA: Diagnosis not present

## 2020-01-04 DIAGNOSIS — R2681 Unsteadiness on feet: Secondary | ICD-10-CM | POA: Diagnosis not present

## 2020-01-04 DIAGNOSIS — R262 Difficulty in walking, not elsewhere classified: Secondary | ICD-10-CM | POA: Diagnosis not present

## 2020-01-04 DIAGNOSIS — M545 Low back pain: Secondary | ICD-10-CM | POA: Diagnosis not present

## 2020-01-05 DIAGNOSIS — M545 Low back pain: Secondary | ICD-10-CM | POA: Diagnosis not present

## 2020-01-05 DIAGNOSIS — R262 Difficulty in walking, not elsewhere classified: Secondary | ICD-10-CM | POA: Diagnosis not present

## 2020-01-05 DIAGNOSIS — R41841 Cognitive communication deficit: Secondary | ICD-10-CM | POA: Diagnosis not present

## 2020-01-05 DIAGNOSIS — R2681 Unsteadiness on feet: Secondary | ICD-10-CM | POA: Diagnosis not present

## 2020-01-08 DIAGNOSIS — R2681 Unsteadiness on feet: Secondary | ICD-10-CM | POA: Diagnosis not present

## 2020-01-08 DIAGNOSIS — R262 Difficulty in walking, not elsewhere classified: Secondary | ICD-10-CM | POA: Diagnosis not present

## 2020-01-08 DIAGNOSIS — M545 Low back pain: Secondary | ICD-10-CM | POA: Diagnosis not present

## 2020-01-08 DIAGNOSIS — R41841 Cognitive communication deficit: Secondary | ICD-10-CM | POA: Diagnosis not present

## 2020-01-08 DIAGNOSIS — Z20828 Contact with and (suspected) exposure to other viral communicable diseases: Secondary | ICD-10-CM | POA: Diagnosis not present

## 2020-01-08 DIAGNOSIS — Z1159 Encounter for screening for other viral diseases: Secondary | ICD-10-CM | POA: Diagnosis not present

## 2020-01-10 DIAGNOSIS — R2681 Unsteadiness on feet: Secondary | ICD-10-CM | POA: Diagnosis not present

## 2020-01-10 DIAGNOSIS — R262 Difficulty in walking, not elsewhere classified: Secondary | ICD-10-CM | POA: Diagnosis not present

## 2020-01-10 DIAGNOSIS — M545 Low back pain: Secondary | ICD-10-CM | POA: Diagnosis not present

## 2020-01-10 DIAGNOSIS — R41841 Cognitive communication deficit: Secondary | ICD-10-CM | POA: Diagnosis not present

## 2020-01-11 DIAGNOSIS — I10 Essential (primary) hypertension: Secondary | ICD-10-CM | POA: Diagnosis not present

## 2020-01-11 DIAGNOSIS — F419 Anxiety disorder, unspecified: Secondary | ICD-10-CM | POA: Diagnosis not present

## 2020-01-11 DIAGNOSIS — N400 Enlarged prostate without lower urinary tract symptoms: Secondary | ICD-10-CM | POA: Diagnosis not present

## 2020-01-11 DIAGNOSIS — N1832 Chronic kidney disease, stage 3b: Secondary | ICD-10-CM | POA: Diagnosis not present

## 2020-01-11 DIAGNOSIS — Z0000 Encounter for general adult medical examination without abnormal findings: Secondary | ICD-10-CM | POA: Diagnosis not present

## 2020-01-11 DIAGNOSIS — I340 Nonrheumatic mitral (valve) insufficiency: Secondary | ICD-10-CM | POA: Diagnosis not present

## 2020-01-11 DIAGNOSIS — Z1389 Encounter for screening for other disorder: Secondary | ICD-10-CM | POA: Diagnosis not present

## 2020-01-11 DIAGNOSIS — E7800 Pure hypercholesterolemia, unspecified: Secondary | ICD-10-CM | POA: Diagnosis not present

## 2020-01-12 DIAGNOSIS — R41841 Cognitive communication deficit: Secondary | ICD-10-CM | POA: Diagnosis not present

## 2020-01-12 DIAGNOSIS — R2681 Unsteadiness on feet: Secondary | ICD-10-CM | POA: Diagnosis not present

## 2020-01-12 DIAGNOSIS — M545 Low back pain: Secondary | ICD-10-CM | POA: Diagnosis not present

## 2020-01-12 DIAGNOSIS — R262 Difficulty in walking, not elsewhere classified: Secondary | ICD-10-CM | POA: Diagnosis not present

## 2020-01-15 DIAGNOSIS — R41841 Cognitive communication deficit: Secondary | ICD-10-CM | POA: Diagnosis not present

## 2020-01-15 DIAGNOSIS — Z1159 Encounter for screening for other viral diseases: Secondary | ICD-10-CM | POA: Diagnosis not present

## 2020-01-15 DIAGNOSIS — R262 Difficulty in walking, not elsewhere classified: Secondary | ICD-10-CM | POA: Diagnosis not present

## 2020-01-15 DIAGNOSIS — M545 Low back pain: Secondary | ICD-10-CM | POA: Diagnosis not present

## 2020-01-15 DIAGNOSIS — R2681 Unsteadiness on feet: Secondary | ICD-10-CM | POA: Diagnosis not present

## 2020-01-15 DIAGNOSIS — Z20828 Contact with and (suspected) exposure to other viral communicable diseases: Secondary | ICD-10-CM | POA: Diagnosis not present

## 2020-01-16 DIAGNOSIS — M545 Low back pain: Secondary | ICD-10-CM | POA: Diagnosis not present

## 2020-01-16 DIAGNOSIS — R262 Difficulty in walking, not elsewhere classified: Secondary | ICD-10-CM | POA: Diagnosis not present

## 2020-01-16 DIAGNOSIS — R2681 Unsteadiness on feet: Secondary | ICD-10-CM | POA: Diagnosis not present

## 2020-01-16 DIAGNOSIS — R41841 Cognitive communication deficit: Secondary | ICD-10-CM | POA: Diagnosis not present

## 2020-01-18 DIAGNOSIS — Z23 Encounter for immunization: Secondary | ICD-10-CM | POA: Diagnosis not present

## 2020-01-22 DIAGNOSIS — R262 Difficulty in walking, not elsewhere classified: Secondary | ICD-10-CM | POA: Diagnosis not present

## 2020-01-22 DIAGNOSIS — Z20828 Contact with and (suspected) exposure to other viral communicable diseases: Secondary | ICD-10-CM | POA: Diagnosis not present

## 2020-01-22 DIAGNOSIS — Z1159 Encounter for screening for other viral diseases: Secondary | ICD-10-CM | POA: Diagnosis not present

## 2020-01-22 DIAGNOSIS — R2681 Unsteadiness on feet: Secondary | ICD-10-CM | POA: Diagnosis not present

## 2020-01-22 DIAGNOSIS — M545 Low back pain: Secondary | ICD-10-CM | POA: Diagnosis not present

## 2020-01-22 DIAGNOSIS — R41841 Cognitive communication deficit: Secondary | ICD-10-CM | POA: Diagnosis not present

## 2020-01-24 DIAGNOSIS — R41841 Cognitive communication deficit: Secondary | ICD-10-CM | POA: Diagnosis not present

## 2020-01-24 DIAGNOSIS — R262 Difficulty in walking, not elsewhere classified: Secondary | ICD-10-CM | POA: Diagnosis not present

## 2020-01-24 DIAGNOSIS — R2681 Unsteadiness on feet: Secondary | ICD-10-CM | POA: Diagnosis not present

## 2020-01-24 DIAGNOSIS — M545 Low back pain: Secondary | ICD-10-CM | POA: Diagnosis not present

## 2020-01-25 DIAGNOSIS — M545 Low back pain: Secondary | ICD-10-CM | POA: Diagnosis not present

## 2020-01-25 DIAGNOSIS — R262 Difficulty in walking, not elsewhere classified: Secondary | ICD-10-CM | POA: Diagnosis not present

## 2020-01-25 DIAGNOSIS — R2681 Unsteadiness on feet: Secondary | ICD-10-CM | POA: Diagnosis not present

## 2020-01-25 DIAGNOSIS — R41841 Cognitive communication deficit: Secondary | ICD-10-CM | POA: Diagnosis not present

## 2020-01-29 DIAGNOSIS — Z20828 Contact with and (suspected) exposure to other viral communicable diseases: Secondary | ICD-10-CM | POA: Diagnosis not present

## 2020-01-29 DIAGNOSIS — Z1159 Encounter for screening for other viral diseases: Secondary | ICD-10-CM | POA: Diagnosis not present

## 2020-01-30 DIAGNOSIS — R41841 Cognitive communication deficit: Secondary | ICD-10-CM | POA: Diagnosis not present

## 2020-02-01 DIAGNOSIS — R41841 Cognitive communication deficit: Secondary | ICD-10-CM | POA: Diagnosis not present

## 2020-02-05 DIAGNOSIS — Z1159 Encounter for screening for other viral diseases: Secondary | ICD-10-CM | POA: Diagnosis not present

## 2020-02-05 DIAGNOSIS — Z20828 Contact with and (suspected) exposure to other viral communicable diseases: Secondary | ICD-10-CM | POA: Diagnosis not present

## 2020-02-06 DIAGNOSIS — R41841 Cognitive communication deficit: Secondary | ICD-10-CM | POA: Diagnosis not present

## 2020-02-08 DIAGNOSIS — R41841 Cognitive communication deficit: Secondary | ICD-10-CM | POA: Diagnosis not present

## 2020-02-09 DIAGNOSIS — R41841 Cognitive communication deficit: Secondary | ICD-10-CM | POA: Diagnosis not present

## 2020-02-12 DIAGNOSIS — Z20828 Contact with and (suspected) exposure to other viral communicable diseases: Secondary | ICD-10-CM | POA: Diagnosis not present

## 2020-02-12 DIAGNOSIS — Z1159 Encounter for screening for other viral diseases: Secondary | ICD-10-CM | POA: Diagnosis not present

## 2020-02-14 DIAGNOSIS — R41841 Cognitive communication deficit: Secondary | ICD-10-CM | POA: Diagnosis not present

## 2020-02-15 DIAGNOSIS — R41841 Cognitive communication deficit: Secondary | ICD-10-CM | POA: Diagnosis not present

## 2020-02-16 DIAGNOSIS — R41841 Cognitive communication deficit: Secondary | ICD-10-CM | POA: Diagnosis not present

## 2020-02-19 DIAGNOSIS — Z1159 Encounter for screening for other viral diseases: Secondary | ICD-10-CM | POA: Diagnosis not present

## 2020-02-19 DIAGNOSIS — Z20828 Contact with and (suspected) exposure to other viral communicable diseases: Secondary | ICD-10-CM | POA: Diagnosis not present

## 2020-02-20 DIAGNOSIS — R41841 Cognitive communication deficit: Secondary | ICD-10-CM | POA: Diagnosis not present

## 2020-02-22 DIAGNOSIS — R41841 Cognitive communication deficit: Secondary | ICD-10-CM | POA: Diagnosis not present

## 2020-02-26 DIAGNOSIS — Z1159 Encounter for screening for other viral diseases: Secondary | ICD-10-CM | POA: Diagnosis not present

## 2020-02-26 DIAGNOSIS — Z20828 Contact with and (suspected) exposure to other viral communicable diseases: Secondary | ICD-10-CM | POA: Diagnosis not present

## 2020-02-26 DIAGNOSIS — R41841 Cognitive communication deficit: Secondary | ICD-10-CM | POA: Diagnosis not present

## 2020-02-27 DIAGNOSIS — R41841 Cognitive communication deficit: Secondary | ICD-10-CM | POA: Diagnosis not present

## 2020-03-04 DIAGNOSIS — Z1159 Encounter for screening for other viral diseases: Secondary | ICD-10-CM | POA: Diagnosis not present

## 2020-03-04 DIAGNOSIS — Z20828 Contact with and (suspected) exposure to other viral communicable diseases: Secondary | ICD-10-CM | POA: Diagnosis not present

## 2020-03-05 DIAGNOSIS — G4700 Insomnia, unspecified: Secondary | ICD-10-CM | POA: Diagnosis not present

## 2020-03-05 DIAGNOSIS — I2510 Atherosclerotic heart disease of native coronary artery without angina pectoris: Secondary | ICD-10-CM | POA: Diagnosis not present

## 2020-03-05 DIAGNOSIS — N400 Enlarged prostate without lower urinary tract symptoms: Secondary | ICD-10-CM | POA: Diagnosis not present

## 2020-03-05 DIAGNOSIS — N184 Chronic kidney disease, stage 4 (severe): Secondary | ICD-10-CM | POA: Diagnosis not present

## 2020-03-05 DIAGNOSIS — N183 Chronic kidney disease, stage 3 unspecified: Secondary | ICD-10-CM | POA: Diagnosis not present

## 2020-03-05 DIAGNOSIS — I10 Essential (primary) hypertension: Secondary | ICD-10-CM | POA: Diagnosis not present

## 2020-03-05 DIAGNOSIS — F329 Major depressive disorder, single episode, unspecified: Secondary | ICD-10-CM | POA: Diagnosis not present

## 2020-03-05 DIAGNOSIS — I129 Hypertensive chronic kidney disease with stage 1 through stage 4 chronic kidney disease, or unspecified chronic kidney disease: Secondary | ICD-10-CM | POA: Diagnosis not present

## 2020-03-05 DIAGNOSIS — E785 Hyperlipidemia, unspecified: Secondary | ICD-10-CM | POA: Diagnosis not present

## 2020-03-05 DIAGNOSIS — E7800 Pure hypercholesterolemia, unspecified: Secondary | ICD-10-CM | POA: Diagnosis not present

## 2020-03-05 DIAGNOSIS — R52 Pain, unspecified: Secondary | ICD-10-CM | POA: Diagnosis not present

## 2020-03-06 DIAGNOSIS — R0789 Other chest pain: Secondary | ICD-10-CM | POA: Diagnosis not present

## 2020-03-06 DIAGNOSIS — H6123 Impacted cerumen, bilateral: Secondary | ICD-10-CM | POA: Diagnosis not present

## 2020-03-11 DIAGNOSIS — Z1159 Encounter for screening for other viral diseases: Secondary | ICD-10-CM | POA: Diagnosis not present

## 2020-03-11 DIAGNOSIS — Z20828 Contact with and (suspected) exposure to other viral communicable diseases: Secondary | ICD-10-CM | POA: Diagnosis not present

## 2020-03-18 ENCOUNTER — Ambulatory Visit: Payer: Medicare Other | Admitting: Cardiovascular Disease

## 2020-03-18 DIAGNOSIS — Z20828 Contact with and (suspected) exposure to other viral communicable diseases: Secondary | ICD-10-CM | POA: Diagnosis not present

## 2020-03-18 DIAGNOSIS — Z1159 Encounter for screening for other viral diseases: Secondary | ICD-10-CM | POA: Diagnosis not present

## 2020-03-21 ENCOUNTER — Other Ambulatory Visit: Payer: Self-pay

## 2020-03-21 ENCOUNTER — Ambulatory Visit (INDEPENDENT_AMBULATORY_CARE_PROVIDER_SITE_OTHER): Payer: Medicare Other | Admitting: Cardiovascular Disease

## 2020-03-21 ENCOUNTER — Encounter: Payer: Self-pay | Admitting: Cardiovascular Disease

## 2020-03-21 VITALS — BP 116/68 | HR 67 | Ht 64.0 in | Wt 127.0 lb

## 2020-03-21 DIAGNOSIS — I340 Nonrheumatic mitral (valve) insufficiency: Secondary | ICD-10-CM

## 2020-03-21 DIAGNOSIS — C44311 Basal cell carcinoma of skin of nose: Secondary | ICD-10-CM | POA: Diagnosis not present

## 2020-03-21 DIAGNOSIS — D485 Neoplasm of uncertain behavior of skin: Secondary | ICD-10-CM | POA: Diagnosis not present

## 2020-03-21 NOTE — Progress Notes (Signed)
r  Chief Complaint  Patient presents with  . Follow-up    PAF   History of Present Illness: 86 yo male with history of chronic kidney disease, paroxysmal atrial fibrillation, GERD, HTN, HLD and severe mitral regurgitation who is here today for follow up. He had been followed by Dr. Johnsie Cancel in our office. I saw him  in the valve clinic as a new patient in October 2016 to discuss his severe mitral regurgitation. 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 After a long discussion with the patient and his family, we elected for conservative management of his valve disease. He has remote atrial fibrillation but no recent recurrences. Echo September 2017 with normal LV systolic function, severe MR. PA pressure estimated at 56 mmHg.   He is here today for follow up. The patient denies any chest pain, dyspnea, palpitations, lower extremity edema, orthopnea, PND, dizziness, near syncope or syncope.    Primary Care Physician: Seward Carol, MD  Past Medical History:  Diagnosis Date  . Anginal pain ([place])   . Anxiety   . Bleeding from the nose 2009   cautery by Dr. Erik Obey  . BPH (benign prostatic hyperplasia)   . CAD (coronary artery disease)   . Cataract    right eye  . CKD (chronic kidney disease), stage III   . Distal radius fracture 2013   Dr. Fredna Dow  . Dysrhythmia    hx of atrial fib- 10 years ago   . GERD (gastroesophageal reflux disease)   . Gout   . H/O hiatal hernia   . Hyperlipemia   . Hypertension   . Impaired fasting glucose   . Inguinal hernia without mention of obstruction or gangrene, unilateral or unspecified, (not specified as recurrent)   . Lumbar radiculopathy   . Major depressive disorder, single episode, unspecified   . Nosebleed    most recent 05/24/13  to have  cauterized prior to surgery   . Osteoarthritis   . Osteopenia 2002  . Skin cancer of forehead    left (BCE)  . Vertigo 1999   negative ct scan head; rare  recurrance, last in 2013  . Vision disturbance     Past Surgical History:  Procedure Laterality Date  . ESOPHAGOGASTRODUODENOSCOPY N/A 07/10/2014   Procedure: ESOPHAGOGASTRODUODENOSCOPY (EGD);  Surgeon: Garlan Fair, MD;  Location: [facility];  Service: Endoscopy;  Laterality: N/A;  . EYE SURGERY     right eye cataract surgery   . GASTROSTOMY N/A 10/03/2014   Procedure: GASTROSTOMY TUBE;  Surgeon: Fanny Skates, MD;  [facility];  Service: General;  Laterality: N/A;  . HIATAL HERNIA REPAIR N/A 10/03/2014   Procedure: [place], POSSIBLE OPEN;  Surgeon: Fanny Skates, MD;  [facility];  Service: General;  Laterality: N/A;  . INGUINAL HERNIA REPAIR Right 06/07/2013   Procedure: LAPAROSCOPIC INGUINAL HERNIA REPAIR;  Surgeon: Adin Hector, MD;  [facility];  Service: General;  Laterality: Right;  With Mesh  . INSERTION OF MESH Right 06/07/2013   Procedure: INSERTION OF MESH;  Surgeon: Adin Hector, MD;  [facility];  Service: General;  Laterality: Right;  . TONSILLECTOMY     at age 46 yrs old    Current Outpatient Medications  Medication Sig Dispense Refill  . ALPRAZolam (XANAX) 0.25 MG tablet Take 0.125 mg by mouth 2 (two) times daily as needed for anxiety or sleep.     Marland Kitchen aspirin 81 MG tablet Take  81 mg by mouth daily.    . meclizine (ANTIVERT) 12.5 MG tablet Take 1 tablet (12.5 mg total) by mouth 3 (three) times daily as needed for dizziness (Vertigo). 30 tablet 0  . metoprolol tartrate (LOPRESSOR) 25 MG tablet Take 12.5 mg by mouth 2 (two) times daily.    Marland Kitchen omeprazole (PRILOSEC) 20 MG capsule Take 40 mg by mouth daily before breakfast.     . polyethylene glycol (MIRALAX / GLYCOLAX) packet Take 17 g by mouth every other day.    . pravastatin (PRAVACHOL) 20 MG tablet Take 20 mg by mouth at bedtime.    . tamsulosin (FLOMAX) 0.4 MG CAPS capsule Take 0.4 mg by mouth daily after supper.    Marland Kitchen lisinopril (PRINIVIL,ZESTRIL) 10 MG tablet  Take 1 tablet (10 mg total) by mouth daily. 30 tablet 6   No current facility-administered medications for this visit.    Allergies  Allergen Reactions  . Penicillins Nausea Only    Upset stomach .     Social History   Socioeconomic History  . Marital status: Widowed    Spouse name: Not on file  . Number of children: 2  . Years of education: Not on file  . Highest education level: Not on file  Occupational History  . 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 Smoking status: Never Smoker  . Smokeless tobacco: Never Used  Substance and Sexual Activity  . Alcohol use: Yes    Comment: occasional  . Drug use: No  . Sexual activity: Not on file  Other Topics Concern  . Not on file  Social History Narrative  . Not on file   Social Determinants of Health   Financial Resource Strain:   . Difficulty of Paying Living Expenses:   Food Insecurity:   . Worried About Charity fundraiser in the Last Year:   . Arboriculturist in the Last Year:   Transportation Needs:   . Film/video editor (Medical):   Marland Kitchen Lack of Transportation (Non-Medical):   Physical Activity:   . Days of Exercise per Week:   . Minutes of Exercise per Session:   Stress:   . Feeling of Stress :   Social Connections:   . Frequency of Communication with Friends and Family:   . Frequency of Social Gatherings with Friends and Family:   . Attends Religious Services:   . Active Member of Clubs or Organizations:   . Attends Archivist Meetings:   Marland Kitchen Marital Status:   Intimate Partner Violence:   . Fear of Current or Ex-Partner:   . Emotionally Abused:   Marland Kitchen Physically Abused:   . Sexually Abused:     Family History  Problem Relation Age of Onset  . Heart disease Mother   . Heart disease Father   . Cancer Sister        breast     Review of Systems:  As stated in the HPI and otherwise negative.   BP 116/68   Pulse 67   Ht 5\' 4"  (1.626 m)   Wt  127 lb (57.6 kg)   SpO2 97%   BMI 21.80 kg/m   Physical Examination:  General: Well developed, well nourished, NAD  HEENT: OP clear, mucus membranes moist  SKIN: warm, dry. No rashes. Neuro: No focal deficits  Musculoskeletal: Muscle strength 5/5 all ext  Psychiatric: Mood and affect normal  Neck: No JVD, no carotid bruits, no thyromegaly, no  lymphadenopathy.  Lungs:Clear bilaterally, no wheezes, rhonci, crackles Cardiovascular: Regular rate and rhythm. Systolic murmur.  Abdomen:Soft. Bowel sounds present. Non-tender.  Extremities: No lower extremity edema. Pulses are 2 + in the bilateral DP/PT.  Echo 08/24/16: Left ventricle: The cavity size was normal. There was moderate   concentric hypertrophy. Systolic function was normal. The   estimated ejection fraction was in the range of 60% to 65%. Wall   motion was normal; there were no regional wall motion   abnormalities. Features are consistent with a pseudonormal left   ventricular filling pattern, with concomitant abnormal relaxation   and increased filling pressure (grade 2 diastolic dysfunction). - Aortic valve: There was trivial regurgitation. - Mitral valve: Probable, severe flail motion involving the lateral   scallop of the posterior leaflet due to rupture of one or more   chords. There was severe regurgitation directed eccentrically and   toward the septum. Severe regurgitation is suggested by pulmonary   vein systolic flow reversal. - Left atrium: The atrium was moderately dilated. - Tricuspid valve: There was mild-moderate regurgitation directed   centrally. - Pulmonary arteries: Systolic pressure was moderately increased.   PA peak pressure: 56 mm Hg (S).  EKG:  EKG is ordered today. The ekg ordered today demonstrates  Sinus, PACs.   Recent Labs: No results found for requested labs within last 8760 hours.   Lipid Panel    Component Value Date/Time   CHOL 160 11/22/2017 1457   TRIG 111 11/22/2017 1457   HDL  66 11/22/2017 1457   CHOLHDL 2.4 11/22/2017 1457   VLDL 22 11/22/2017 1457   LDLCALC 72 11/22/2017 1457     Wt Readings from Last 3 Encounters:  03/21/20 127 lb (57.6 kg)  08/10/19 125 lb (56.7 kg)  05/09/19 126 lb (57.2 kg)     Other studies Reviewed: Additional studies/ records that were reviewed today include: . Review of the above records demonstrates:   Assessment and Plan:   1. Severe mitral regurgitation: He has severe MR that is felt to be due to flail leaflet with a probable chord rupture. No evidence of volume overload. Ongoing fatigue for years. Given his age, will not consider any future valve intervention. Fortunately he feels great and has no need for diuretics at this time.   Current medicines are reviewed at length with the patient today.  The patient does not have concerns regarding medicines.  The following changes have been made:  no change  Labs/ tests ordered today include:   Orders Placed This Encounter  Procedures  . EKG 12-Lead     Disposition:   FU with me in 12 months   Signed, Lauree Chandler, MD 03/21/2020 12:22 PM    [facility], [place], [place]  [address] Phone: [phone]; Fax: [phone]

## 2020-03-21 NOTE — Patient Instructions (Signed)

## 2020-03-25 DIAGNOSIS — Z20828 Contact with and (suspected) exposure to other viral communicable diseases: Secondary | ICD-10-CM | POA: Diagnosis not present

## 2020-03-25 DIAGNOSIS — Z1159 Encounter for screening for other viral diseases: Secondary | ICD-10-CM | POA: Diagnosis not present

## 2020-04-01 DIAGNOSIS — Z1159 Encounter for screening for other viral diseases: Secondary | ICD-10-CM | POA: Diagnosis not present

## 2020-04-01 DIAGNOSIS — Z20828 Contact with and (suspected) exposure to other viral communicable diseases: Secondary | ICD-10-CM | POA: Diagnosis not present

## 2020-04-03 ENCOUNTER — Ambulatory Visit: Payer: Medicare Other | Admitting: Podiatry

## 2020-04-04 ENCOUNTER — Other Ambulatory Visit: Payer: Self-pay | Admitting: Geriatric Medicine

## 2020-04-04 ENCOUNTER — Ambulatory Visit
Admission: RE | Admit: 2020-04-04 | Discharge: 2020-04-04 | Disposition: A | Discharge status: Home / Self Care | Payer: Medicare Other | Source: Ambulatory Visit | Attending: Geriatric Medicine | Admitting: Geriatric Medicine

## 2020-04-04 DIAGNOSIS — R1031 Right lower quadrant pain: Secondary | ICD-10-CM

## 2020-04-04 DIAGNOSIS — N1832 Chronic kidney disease, stage 3b: Secondary | ICD-10-CM | POA: Diagnosis not present

## 2020-04-04 DIAGNOSIS — N200 Calculus of kidney: Secondary | ICD-10-CM | POA: Diagnosis not present

## 2020-04-04 DIAGNOSIS — R634 Abnormal weight loss: Secondary | ICD-10-CM | POA: Diagnosis not present

## 2020-04-04 DIAGNOSIS — D649 Anemia, unspecified: Secondary | ICD-10-CM | POA: Diagnosis not present

## 2020-04-04 DIAGNOSIS — K5901 Slow transit constipation: Secondary | ICD-10-CM | POA: Diagnosis not present

## 2020-04-08 DIAGNOSIS — Z1159 Encounter for screening for other viral diseases: Secondary | ICD-10-CM | POA: Diagnosis not present

## 2020-04-08 DIAGNOSIS — Z20828 Contact with and (suspected) exposure to other viral communicable diseases: Secondary | ICD-10-CM | POA: Diagnosis not present

## 2020-04-15 DIAGNOSIS — Z20828 Contact with and (suspected) exposure to other viral communicable diseases: Secondary | ICD-10-CM | POA: Diagnosis not present

## 2020-04-15 DIAGNOSIS — Z1159 Encounter for screening for other viral diseases: Secondary | ICD-10-CM | POA: Diagnosis not present

## 2020-04-19 ENCOUNTER — Ambulatory Visit (INDEPENDENT_AMBULATORY_CARE_PROVIDER_SITE_OTHER): Payer: Medicare Other | Admitting: Podiatry

## 2020-04-19 ENCOUNTER — Other Ambulatory Visit: Payer: Self-pay

## 2020-04-19 ENCOUNTER — Encounter: Payer: Self-pay | Admitting: Podiatry

## 2020-04-19 DIAGNOSIS — M2040 Other hammer toe(s) (acquired), unspecified foot: Secondary | ICD-10-CM

## 2020-04-19 DIAGNOSIS — M2030 Hallux varus (acquired), unspecified foot: Secondary | ICD-10-CM

## 2020-04-19 DIAGNOSIS — M79609 Pain in unspecified limb: Secondary | ICD-10-CM | POA: Diagnosis not present

## 2020-04-19 DIAGNOSIS — B351 Tinea unguium: Secondary | ICD-10-CM

## 2020-04-19 NOTE — Progress Notes (Signed)
This patient returns to my office for at risk foot care.  This patient requires this care by a professional since this patient will be at risk due to having chronic kidney disease.   This patient is unable to cut nails himself since the patient cannot reach his nails.These nails are painful walking and wearing shoes.  This patient presents for at risk foot care today.  General Appearance  Alert, conversant and in no acute stress.  Vascular  Dorsalis pedis and posterior tibial  pulses are palpable  bilaterally.  Capillary return is within normal limits  bilaterally. Temperature is within normal limits  bilaterally.  Neurologic  Senn-Weinstein monofilament wire test within normal limits  bilaterally. Muscle power within normal limits bilaterally.  Nails Thick disfigured discolored nails with subungual debris  from hallux to fifth toes bilaterally. No evidence of bacterial infection or drainage bilaterally.  Orthopedic  No limitations of motion  feet .  No crepitus or effusions noted.  No bony pathology or digital deformities noted.  Plantar flexed 2,3  B/L  Skin  normotropic skin with no porokeratosis noted bilaterally.  No signs of infections or ulcers noted.  Asymptomatic clavi  B/L.   Onychomycosis  Pain in right toes  Pain in left toes  Consent was obtained for treatment procedures.   Mechanical debridement of nails 1-5  bilaterally performed with a nail nipper.  Filed with dremel without incident.    Return office visit   3 months                   Told patient to return for periodic foot care and evaluation due to potential at risk complications.   Gardiner Barefoot DPM

## 2020-04-22 DIAGNOSIS — Z20828 Contact with and (suspected) exposure to other viral communicable diseases: Secondary | ICD-10-CM | POA: Diagnosis not present

## 2020-04-22 DIAGNOSIS — Z1159 Encounter for screening for other viral diseases: Secondary | ICD-10-CM | POA: Diagnosis not present

## 2020-04-29 DIAGNOSIS — Z20828 Contact with and (suspected) exposure to other viral communicable diseases: Secondary | ICD-10-CM | POA: Diagnosis not present

## 2020-04-29 DIAGNOSIS — Z1159 Encounter for screening for other viral diseases: Secondary | ICD-10-CM | POA: Diagnosis not present

## 2020-05-06 DIAGNOSIS — Z1159 Encounter for screening for other viral diseases: Secondary | ICD-10-CM | POA: Diagnosis not present

## 2020-05-06 DIAGNOSIS — Z20828 Contact with and (suspected) exposure to other viral communicable diseases: Secondary | ICD-10-CM | POA: Diagnosis not present

## 2020-05-13 DIAGNOSIS — Z20828 Contact with and (suspected) exposure to other viral communicable diseases: Secondary | ICD-10-CM | POA: Diagnosis not present

## 2020-05-13 DIAGNOSIS — Z1159 Encounter for screening for other viral diseases: Secondary | ICD-10-CM | POA: Diagnosis not present

## 2020-05-20 DIAGNOSIS — Z1159 Encounter for screening for other viral diseases: Secondary | ICD-10-CM | POA: Diagnosis not present

## 2020-05-20 DIAGNOSIS — Z20828 Contact with and (suspected) exposure to other viral communicable diseases: Secondary | ICD-10-CM | POA: Diagnosis not present

## 2020-05-27 DIAGNOSIS — Z1159 Encounter for screening for other viral diseases: Secondary | ICD-10-CM | POA: Diagnosis not present

## 2020-05-27 DIAGNOSIS — Z20828 Contact with and (suspected) exposure to other viral communicable diseases: Secondary | ICD-10-CM | POA: Diagnosis not present

## 2020-06-10 DIAGNOSIS — Z1159 Encounter for screening for other viral diseases: Secondary | ICD-10-CM | POA: Diagnosis not present

## 2020-06-10 DIAGNOSIS — Z20828 Contact with and (suspected) exposure to other viral communicable diseases: Secondary | ICD-10-CM | POA: Diagnosis not present

## 2020-06-17 DIAGNOSIS — Z20828 Contact with and (suspected) exposure to other viral communicable diseases: Secondary | ICD-10-CM | POA: Diagnosis not present

## 2020-06-17 DIAGNOSIS — Z1159 Encounter for screening for other viral diseases: Secondary | ICD-10-CM | POA: Diagnosis not present

## 2020-06-24 DIAGNOSIS — Z20828 Contact with and (suspected) exposure to other viral communicable diseases: Secondary | ICD-10-CM | POA: Diagnosis not present

## 2020-06-24 DIAGNOSIS — Z1159 Encounter for screening for other viral diseases: Secondary | ICD-10-CM | POA: Diagnosis not present

## 2020-07-01 DIAGNOSIS — Z20828 Contact with and (suspected) exposure to other viral communicable diseases: Secondary | ICD-10-CM | POA: Diagnosis not present

## 2020-07-01 DIAGNOSIS — Z1159 Encounter for screening for other viral diseases: Secondary | ICD-10-CM | POA: Diagnosis not present

## 2020-07-02 ENCOUNTER — Ambulatory Visit: Payer: Medicare Other | Admitting: Podiatry

## 2020-07-08 DIAGNOSIS — Z20828 Contact with and (suspected) exposure to other viral communicable diseases: Secondary | ICD-10-CM | POA: Diagnosis not present

## 2020-07-08 DIAGNOSIS — Z1159 Encounter for screening for other viral diseases: Secondary | ICD-10-CM | POA: Diagnosis not present

## 2020-07-11 DIAGNOSIS — N1832 Chronic kidney disease, stage 3b: Secondary | ICD-10-CM | POA: Diagnosis not present

## 2020-07-11 DIAGNOSIS — F419 Anxiety disorder, unspecified: Secondary | ICD-10-CM | POA: Diagnosis not present

## 2020-07-11 DIAGNOSIS — E7800 Pure hypercholesterolemia, unspecified: Secondary | ICD-10-CM | POA: Diagnosis not present

## 2020-07-11 DIAGNOSIS — R32 Unspecified urinary incontinence: Secondary | ICD-10-CM | POA: Diagnosis not present

## 2020-07-11 DIAGNOSIS — I10 Essential (primary) hypertension: Secondary | ICD-10-CM | POA: Diagnosis not present

## 2020-07-15 DIAGNOSIS — Z1159 Encounter for screening for other viral diseases: Secondary | ICD-10-CM | POA: Diagnosis not present

## 2020-07-15 DIAGNOSIS — Z20828 Contact with and (suspected) exposure to other viral communicable diseases: Secondary | ICD-10-CM | POA: Diagnosis not present

## 2020-07-22 DIAGNOSIS — Z1159 Encounter for screening for other viral diseases: Secondary | ICD-10-CM | POA: Diagnosis not present

## 2020-07-22 DIAGNOSIS — Z20828 Contact with and (suspected) exposure to other viral communicable diseases: Secondary | ICD-10-CM | POA: Diagnosis not present

## 2020-07-29 DIAGNOSIS — Z20828 Contact with and (suspected) exposure to other viral communicable diseases: Secondary | ICD-10-CM | POA: Diagnosis not present

## 2020-07-29 DIAGNOSIS — Z1159 Encounter for screening for other viral diseases: Secondary | ICD-10-CM | POA: Diagnosis not present

## 2020-08-05 DIAGNOSIS — Z1159 Encounter for screening for other viral diseases: Secondary | ICD-10-CM | POA: Diagnosis not present

## 2020-08-05 DIAGNOSIS — Z20828 Contact with and (suspected) exposure to other viral communicable diseases: Secondary | ICD-10-CM | POA: Diagnosis not present

## 2020-08-12 DIAGNOSIS — Z20828 Contact with and (suspected) exposure to other viral communicable diseases: Secondary | ICD-10-CM | POA: Diagnosis not present

## 2020-08-12 DIAGNOSIS — Z1159 Encounter for screening for other viral diseases: Secondary | ICD-10-CM | POA: Diagnosis not present

## 2020-08-14 DIAGNOSIS — F329 Major depressive disorder, single episode, unspecified: Secondary | ICD-10-CM | POA: Diagnosis not present

## 2020-08-14 DIAGNOSIS — N183 Chronic kidney disease, stage 3 unspecified: Secondary | ICD-10-CM | POA: Diagnosis not present

## 2020-08-14 DIAGNOSIS — N400 Enlarged prostate without lower urinary tract symptoms: Secondary | ICD-10-CM | POA: Diagnosis not present

## 2020-08-14 DIAGNOSIS — E7800 Pure hypercholesterolemia, unspecified: Secondary | ICD-10-CM | POA: Diagnosis not present

## 2020-08-14 DIAGNOSIS — I10 Essential (primary) hypertension: Secondary | ICD-10-CM | POA: Diagnosis not present

## 2020-08-14 DIAGNOSIS — N184 Chronic kidney disease, stage 4 (severe): Secondary | ICD-10-CM | POA: Diagnosis not present

## 2020-08-14 DIAGNOSIS — E785 Hyperlipidemia, unspecified: Secondary | ICD-10-CM | POA: Diagnosis not present

## 2020-08-14 DIAGNOSIS — G4700 Insomnia, unspecified: Secondary | ICD-10-CM | POA: Diagnosis not present

## 2020-08-14 DIAGNOSIS — I2510 Atherosclerotic heart disease of native coronary artery without angina pectoris: Secondary | ICD-10-CM | POA: Diagnosis not present

## 2020-08-14 DIAGNOSIS — I129 Hypertensive chronic kidney disease with stage 1 through stage 4 chronic kidney disease, or unspecified chronic kidney disease: Secondary | ICD-10-CM | POA: Diagnosis not present

## 2020-08-19 DIAGNOSIS — Z1159 Encounter for screening for other viral diseases: Secondary | ICD-10-CM | POA: Diagnosis not present

## 2020-08-19 DIAGNOSIS — Z20828 Contact with and (suspected) exposure to other viral communicable diseases: Secondary | ICD-10-CM | POA: Diagnosis not present

## 2020-08-26 DIAGNOSIS — Z1159 Encounter for screening for other viral diseases: Secondary | ICD-10-CM | POA: Diagnosis not present

## 2020-08-26 DIAGNOSIS — Z20828 Contact with and (suspected) exposure to other viral communicable diseases: Secondary | ICD-10-CM | POA: Diagnosis not present

## 2020-08-27 DIAGNOSIS — Z23 Encounter for immunization: Secondary | ICD-10-CM | POA: Diagnosis not present

## 2020-08-28 DIAGNOSIS — L570 Actinic keratosis: Secondary | ICD-10-CM | POA: Diagnosis not present

## 2020-08-28 DIAGNOSIS — Z85828 Personal history of other malignant neoplasm of skin: Secondary | ICD-10-CM | POA: Diagnosis not present

## 2020-08-28 DIAGNOSIS — L819 Disorder of pigmentation, unspecified: Secondary | ICD-10-CM | POA: Diagnosis not present

## 2020-08-28 DIAGNOSIS — C44319 Basal cell carcinoma of skin of other parts of face: Secondary | ICD-10-CM | POA: Diagnosis not present

## 2020-08-28 DIAGNOSIS — D485 Neoplasm of uncertain behavior of skin: Secondary | ICD-10-CM | POA: Diagnosis not present

## 2020-08-28 DIAGNOSIS — L905 Scar conditions and fibrosis of skin: Secondary | ICD-10-CM | POA: Diagnosis not present

## 2020-09-02 DIAGNOSIS — Z20828 Contact with and (suspected) exposure to other viral communicable diseases: Secondary | ICD-10-CM | POA: Diagnosis not present

## 2020-09-02 DIAGNOSIS — Z1159 Encounter for screening for other viral diseases: Secondary | ICD-10-CM | POA: Diagnosis not present

## 2020-09-09 DIAGNOSIS — Z20828 Contact with and (suspected) exposure to other viral communicable diseases: Secondary | ICD-10-CM | POA: Diagnosis not present

## 2020-09-09 DIAGNOSIS — Z1159 Encounter for screening for other viral diseases: Secondary | ICD-10-CM | POA: Diagnosis not present

## 2020-09-16 DIAGNOSIS — Z20828 Contact with and (suspected) exposure to other viral communicable diseases: Secondary | ICD-10-CM | POA: Diagnosis not present

## 2020-09-16 DIAGNOSIS — Z1159 Encounter for screening for other viral diseases: Secondary | ICD-10-CM | POA: Diagnosis not present

## 2020-09-18 DIAGNOSIS — D485 Neoplasm of uncertain behavior of skin: Secondary | ICD-10-CM | POA: Diagnosis not present

## 2020-09-18 DIAGNOSIS — C44319 Basal cell carcinoma of skin of other parts of face: Secondary | ICD-10-CM | POA: Diagnosis not present

## 2020-09-23 DIAGNOSIS — Z20828 Contact with and (suspected) exposure to other viral communicable diseases: Secondary | ICD-10-CM | POA: Diagnosis not present

## 2020-09-23 DIAGNOSIS — Z1159 Encounter for screening for other viral diseases: Secondary | ICD-10-CM | POA: Diagnosis not present

## 2020-09-26 DIAGNOSIS — N184 Chronic kidney disease, stage 4 (severe): Secondary | ICD-10-CM | POA: Diagnosis not present

## 2020-09-26 DIAGNOSIS — N400 Enlarged prostate without lower urinary tract symptoms: Secondary | ICD-10-CM | POA: Diagnosis not present

## 2020-09-26 DIAGNOSIS — I2510 Atherosclerotic heart disease of native coronary artery without angina pectoris: Secondary | ICD-10-CM | POA: Diagnosis not present

## 2020-09-26 DIAGNOSIS — N183 Chronic kidney disease, stage 3 unspecified: Secondary | ICD-10-CM | POA: Diagnosis not present

## 2020-09-26 DIAGNOSIS — I10 Essential (primary) hypertension: Secondary | ICD-10-CM | POA: Diagnosis not present

## 2020-09-26 DIAGNOSIS — E785 Hyperlipidemia, unspecified: Secondary | ICD-10-CM | POA: Diagnosis not present

## 2020-09-26 DIAGNOSIS — G4700 Insomnia, unspecified: Secondary | ICD-10-CM | POA: Diagnosis not present

## 2020-09-26 DIAGNOSIS — I129 Hypertensive chronic kidney disease with stage 1 through stage 4 chronic kidney disease, or unspecified chronic kidney disease: Secondary | ICD-10-CM | POA: Diagnosis not present

## 2020-09-26 DIAGNOSIS — F329 Major depressive disorder, single episode, unspecified: Secondary | ICD-10-CM | POA: Diagnosis not present

## 2020-09-26 DIAGNOSIS — E7800 Pure hypercholesterolemia, unspecified: Secondary | ICD-10-CM | POA: Diagnosis not present

## 2020-10-02 DIAGNOSIS — C44319 Basal cell carcinoma of skin of other parts of face: Secondary | ICD-10-CM | POA: Diagnosis not present

## 2020-10-07 DIAGNOSIS — Z20828 Contact with and (suspected) exposure to other viral communicable diseases: Secondary | ICD-10-CM | POA: Diagnosis not present

## 2020-10-07 DIAGNOSIS — Z1159 Encounter for screening for other viral diseases: Secondary | ICD-10-CM | POA: Diagnosis not present

## 2020-10-14 DIAGNOSIS — Z1159 Encounter for screening for other viral diseases: Secondary | ICD-10-CM | POA: Diagnosis not present

## 2020-10-14 DIAGNOSIS — Z20828 Contact with and (suspected) exposure to other viral communicable diseases: Secondary | ICD-10-CM | POA: Diagnosis not present

## 2020-10-17 DIAGNOSIS — M2041 Other hammer toe(s) (acquired), right foot: Secondary | ICD-10-CM | POA: Diagnosis not present

## 2020-10-17 DIAGNOSIS — I739 Peripheral vascular disease, unspecified: Secondary | ICD-10-CM | POA: Diagnosis not present

## 2020-10-17 DIAGNOSIS — B351 Tinea unguium: Secondary | ICD-10-CM | POA: Diagnosis not present

## 2020-10-17 DIAGNOSIS — M79675 Pain in left toe(s): Secondary | ICD-10-CM | POA: Diagnosis not present

## 2020-10-21 DIAGNOSIS — Z1159 Encounter for screening for other viral diseases: Secondary | ICD-10-CM | POA: Diagnosis not present

## 2020-10-21 DIAGNOSIS — Z20828 Contact with and (suspected) exposure to other viral communicable diseases: Secondary | ICD-10-CM | POA: Diagnosis not present

## 2020-10-28 DIAGNOSIS — Z20828 Contact with and (suspected) exposure to other viral communicable diseases: Secondary | ICD-10-CM | POA: Diagnosis not present

## 2020-10-28 DIAGNOSIS — Z1159 Encounter for screening for other viral diseases: Secondary | ICD-10-CM | POA: Diagnosis not present

## 2020-11-04 DIAGNOSIS — Z1159 Encounter for screening for other viral diseases: Secondary | ICD-10-CM | POA: Diagnosis not present

## 2020-11-04 DIAGNOSIS — Z20828 Contact with and (suspected) exposure to other viral communicable diseases: Secondary | ICD-10-CM | POA: Diagnosis not present

## 2020-11-18 DIAGNOSIS — Z20828 Contact with and (suspected) exposure to other viral communicable diseases: Secondary | ICD-10-CM | POA: Diagnosis not present

## 2020-11-18 DIAGNOSIS — Z1159 Encounter for screening for other viral diseases: Secondary | ICD-10-CM | POA: Diagnosis not present

## 2020-11-25 DIAGNOSIS — Z1159 Encounter for screening for other viral diseases: Secondary | ICD-10-CM | POA: Diagnosis not present

## 2020-11-25 DIAGNOSIS — Z20828 Contact with and (suspected) exposure to other viral communicable diseases: Secondary | ICD-10-CM | POA: Diagnosis not present

## 2020-12-02 DIAGNOSIS — Z20828 Contact with and (suspected) exposure to other viral communicable diseases: Secondary | ICD-10-CM | POA: Diagnosis not present

## 2020-12-02 DIAGNOSIS — Z1159 Encounter for screening for other viral diseases: Secondary | ICD-10-CM | POA: Diagnosis not present

## 2020-12-09 DIAGNOSIS — C441192 Basal cell carcinoma of skin of left lower eyelid, including canthus: Secondary | ICD-10-CM | POA: Diagnosis not present

## 2020-12-16 DIAGNOSIS — Z1159 Encounter for screening for other viral diseases: Secondary | ICD-10-CM | POA: Diagnosis not present

## 2020-12-16 DIAGNOSIS — Z20828 Contact with and (suspected) exposure to other viral communicable diseases: Secondary | ICD-10-CM | POA: Diagnosis not present

## 2020-12-23 DIAGNOSIS — Z1159 Encounter for screening for other viral diseases: Secondary | ICD-10-CM | POA: Diagnosis not present

## 2020-12-23 DIAGNOSIS — Z20828 Contact with and (suspected) exposure to other viral communicable diseases: Secondary | ICD-10-CM | POA: Diagnosis not present

## 2020-12-30 DIAGNOSIS — Z1159 Encounter for screening for other viral diseases: Secondary | ICD-10-CM | POA: Diagnosis not present

## 2020-12-30 DIAGNOSIS — Z20828 Contact with and (suspected) exposure to other viral communicable diseases: Secondary | ICD-10-CM | POA: Diagnosis not present

## 2021-01-06 DIAGNOSIS — Z20828 Contact with and (suspected) exposure to other viral communicable diseases: Secondary | ICD-10-CM | POA: Diagnosis not present

## 2021-01-06 DIAGNOSIS — Z1159 Encounter for screening for other viral diseases: Secondary | ICD-10-CM | POA: Diagnosis not present

## 2021-01-10 DIAGNOSIS — R413 Other amnesia: Secondary | ICD-10-CM | POA: Diagnosis not present

## 2021-01-10 DIAGNOSIS — R630 Anorexia: Secondary | ICD-10-CM | POA: Diagnosis not present

## 2021-01-10 DIAGNOSIS — F4321 Adjustment disorder with depressed mood: Secondary | ICD-10-CM | POA: Diagnosis not present

## 2021-01-13 DIAGNOSIS — Z20828 Contact with and (suspected) exposure to other viral communicable diseases: Secondary | ICD-10-CM | POA: Diagnosis not present

## 2021-01-13 DIAGNOSIS — Z1159 Encounter for screening for other viral diseases: Secondary | ICD-10-CM | POA: Diagnosis not present

## 2021-01-20 DIAGNOSIS — Z20828 Contact with and (suspected) exposure to other viral communicable diseases: Secondary | ICD-10-CM | POA: Diagnosis not present

## 2021-01-20 DIAGNOSIS — Z1159 Encounter for screening for other viral diseases: Secondary | ICD-10-CM | POA: Diagnosis not present

## 2021-01-22 DIAGNOSIS — F329 Major depressive disorder, single episode, unspecified: Secondary | ICD-10-CM | POA: Diagnosis not present

## 2021-01-22 DIAGNOSIS — F4321 Adjustment disorder with depressed mood: Secondary | ICD-10-CM | POA: Diagnosis not present

## 2021-01-22 DIAGNOSIS — D509 Iron deficiency anemia, unspecified: Secondary | ICD-10-CM | POA: Diagnosis not present

## 2021-01-22 DIAGNOSIS — R269 Unspecified abnormalities of gait and mobility: Secondary | ICD-10-CM | POA: Diagnosis not present

## 2021-01-22 DIAGNOSIS — R413 Other amnesia: Secondary | ICD-10-CM | POA: Diagnosis not present

## 2021-01-22 DIAGNOSIS — Z7189 Other specified counseling: Secondary | ICD-10-CM | POA: Diagnosis not present

## 2021-01-22 DIAGNOSIS — Z0000 Encounter for general adult medical examination without abnormal findings: Secondary | ICD-10-CM | POA: Diagnosis not present

## 2021-01-22 DIAGNOSIS — R630 Anorexia: Secondary | ICD-10-CM | POA: Diagnosis not present

## 2021-01-22 DIAGNOSIS — I10 Essential (primary) hypertension: Secondary | ICD-10-CM | POA: Diagnosis not present

## 2021-01-27 DIAGNOSIS — Z1159 Encounter for screening for other viral diseases: Secondary | ICD-10-CM | POA: Diagnosis not present

## 2021-01-27 DIAGNOSIS — M6281 Muscle weakness (generalized): Secondary | ICD-10-CM | POA: Diagnosis not present

## 2021-01-27 DIAGNOSIS — Z20828 Contact with and (suspected) exposure to other viral communicable diseases: Secondary | ICD-10-CM | POA: Diagnosis not present

## 2021-01-27 DIAGNOSIS — R2681 Unsteadiness on feet: Secondary | ICD-10-CM | POA: Diagnosis not present

## 2021-01-29 DIAGNOSIS — R2681 Unsteadiness on feet: Secondary | ICD-10-CM | POA: Diagnosis not present

## 2021-01-29 DIAGNOSIS — M6281 Muscle weakness (generalized): Secondary | ICD-10-CM | POA: Diagnosis not present

## 2021-01-31 DIAGNOSIS — R2681 Unsteadiness on feet: Secondary | ICD-10-CM | POA: Diagnosis not present

## 2021-01-31 DIAGNOSIS — M6281 Muscle weakness (generalized): Secondary | ICD-10-CM | POA: Diagnosis not present

## 2021-02-03 DIAGNOSIS — M6281 Muscle weakness (generalized): Secondary | ICD-10-CM | POA: Diagnosis not present

## 2021-02-03 DIAGNOSIS — R2681 Unsteadiness on feet: Secondary | ICD-10-CM | POA: Diagnosis not present

## 2021-02-03 DIAGNOSIS — Z1159 Encounter for screening for other viral diseases: Secondary | ICD-10-CM | POA: Diagnosis not present

## 2021-02-03 DIAGNOSIS — Z20828 Contact with and (suspected) exposure to other viral communicable diseases: Secondary | ICD-10-CM | POA: Diagnosis not present

## 2021-02-05 DIAGNOSIS — M6281 Muscle weakness (generalized): Secondary | ICD-10-CM | POA: Diagnosis not present

## 2021-02-05 DIAGNOSIS — R2681 Unsteadiness on feet: Secondary | ICD-10-CM | POA: Diagnosis not present

## 2021-02-06 DIAGNOSIS — M6281 Muscle weakness (generalized): Secondary | ICD-10-CM | POA: Diagnosis not present

## 2021-02-06 DIAGNOSIS — R2681 Unsteadiness on feet: Secondary | ICD-10-CM | POA: Diagnosis not present

## 2021-02-10 DIAGNOSIS — Z1159 Encounter for screening for other viral diseases: Secondary | ICD-10-CM | POA: Diagnosis not present

## 2021-02-10 DIAGNOSIS — Z20828 Contact with and (suspected) exposure to other viral communicable diseases: Secondary | ICD-10-CM | POA: Diagnosis not present

## 2021-02-11 DIAGNOSIS — R2681 Unsteadiness on feet: Secondary | ICD-10-CM | POA: Diagnosis not present

## 2021-02-11 DIAGNOSIS — M6281 Muscle weakness (generalized): Secondary | ICD-10-CM | POA: Diagnosis not present

## 2021-02-12 DIAGNOSIS — M6281 Muscle weakness (generalized): Secondary | ICD-10-CM | POA: Diagnosis not present

## 2021-02-12 DIAGNOSIS — R2681 Unsteadiness on feet: Secondary | ICD-10-CM | POA: Diagnosis not present

## 2021-02-14 DIAGNOSIS — M6281 Muscle weakness (generalized): Secondary | ICD-10-CM | POA: Diagnosis not present

## 2021-02-14 DIAGNOSIS — R2681 Unsteadiness on feet: Secondary | ICD-10-CM | POA: Diagnosis not present

## 2021-02-17 DIAGNOSIS — M6281 Muscle weakness (generalized): Secondary | ICD-10-CM | POA: Diagnosis not present

## 2021-02-17 DIAGNOSIS — Z20828 Contact with and (suspected) exposure to other viral communicable diseases: Secondary | ICD-10-CM | POA: Diagnosis not present

## 2021-02-17 DIAGNOSIS — Z1159 Encounter for screening for other viral diseases: Secondary | ICD-10-CM | POA: Diagnosis not present

## 2021-02-17 DIAGNOSIS — R2681 Unsteadiness on feet: Secondary | ICD-10-CM | POA: Diagnosis not present

## 2021-02-19 DIAGNOSIS — Z66 Do not resuscitate: Secondary | ICD-10-CM | POA: Diagnosis not present

## 2021-02-19 DIAGNOSIS — R413 Other amnesia: Secondary | ICD-10-CM | POA: Diagnosis not present

## 2021-02-19 DIAGNOSIS — F0390 Unspecified dementia without behavioral disturbance: Secondary | ICD-10-CM | POA: Diagnosis not present

## 2021-02-19 DIAGNOSIS — D649 Anemia, unspecified: Secondary | ICD-10-CM | POA: Diagnosis not present

## 2021-02-19 DIAGNOSIS — N1832 Chronic kidney disease, stage 3b: Secondary | ICD-10-CM | POA: Diagnosis not present

## 2021-02-25 DIAGNOSIS — R2689 Other abnormalities of gait and mobility: Secondary | ICD-10-CM | POA: Diagnosis not present

## 2021-02-25 DIAGNOSIS — M6281 Muscle weakness (generalized): Secondary | ICD-10-CM | POA: Diagnosis not present

## 2021-02-25 DIAGNOSIS — R2681 Unsteadiness on feet: Secondary | ICD-10-CM | POA: Diagnosis not present

## 2021-02-26 DIAGNOSIS — R2681 Unsteadiness on feet: Secondary | ICD-10-CM | POA: Diagnosis not present

## 2021-02-26 DIAGNOSIS — M6281 Muscle weakness (generalized): Secondary | ICD-10-CM | POA: Diagnosis not present

## 2021-02-26 DIAGNOSIS — R2689 Other abnormalities of gait and mobility: Secondary | ICD-10-CM | POA: Diagnosis not present

## 2021-03-07 ENCOUNTER — Encounter (HOSPITAL_COMMUNITY): Payer: Self-pay | Admitting: Emergency Medicine

## 2021-03-07 ENCOUNTER — Emergency Department (HOSPITAL_COMMUNITY): Payer: Medicare Other

## 2021-03-07 ENCOUNTER — Emergency Department (HOSPITAL_COMMUNITY)
Admission: EM | Admit: 2021-03-07 | Discharge: 2021-03-07 | Disposition: A | Discharge status: Home / Self Care | Payer: Medicare Other | Attending: Emergency Medicine | Admitting: Emergency Medicine

## 2021-03-07 DIAGNOSIS — I129 Hypertensive chronic kidney disease with stage 1 through stage 4 chronic kidney disease, or unspecified chronic kidney disease: Secondary | ICD-10-CM | POA: Insufficient documentation

## 2021-03-07 DIAGNOSIS — F0390 Unspecified dementia without behavioral disturbance: Secondary | ICD-10-CM | POA: Insufficient documentation

## 2021-03-07 DIAGNOSIS — N183 Chronic kidney disease, stage 3 unspecified: Secondary | ICD-10-CM | POA: Diagnosis not present

## 2021-03-07 DIAGNOSIS — M25552 Pain in left hip: Secondary | ICD-10-CM | POA: Diagnosis not present

## 2021-03-07 DIAGNOSIS — I2510 Atherosclerotic heart disease of native coronary artery without angina pectoris: Secondary | ICD-10-CM | POA: Diagnosis not present

## 2021-03-07 DIAGNOSIS — M6250 Muscle wasting and atrophy, not elsewhere classified, unspecified site: Secondary | ICD-10-CM | POA: Insufficient documentation

## 2021-03-07 DIAGNOSIS — Z85828 Personal history of other malignant neoplasm of skin: Secondary | ICD-10-CM | POA: Diagnosis not present

## 2021-03-07 DIAGNOSIS — Z79899 Other long term (current) drug therapy: Secondary | ICD-10-CM | POA: Diagnosis not present

## 2021-03-07 DIAGNOSIS — W19XXXA Unspecified fall, initial encounter: Secondary | ICD-10-CM | POA: Diagnosis not present

## 2021-03-07 DIAGNOSIS — Z7982 Long term (current) use of aspirin: Secondary | ICD-10-CM | POA: Insufficient documentation

## 2021-03-07 LAB — CBC WITH DIFFERENTIAL/PLATELET
Abs Immature Granulocytes: 0.09 10*3/uL — ABNORMAL HIGH (ref 0.00–0.07)
Basophils Absolute: 0 10*3/uL (ref 0.0–0.1)
Basophils Relative: 1 %
Eosinophils Absolute: 0.1 10*3/uL (ref 0.0–0.5)
Eosinophils Relative: 1 %
HCT: 31.9 % — ABNORMAL LOW (ref 39.0–52.0)
Hemoglobin: 10.2 g/dL — ABNORMAL LOW (ref 13.0–17.0)
Immature Granulocytes: 1 %
Lymphocytes Relative: 10 %
Lymphs Abs: 0.9 10*3/uL (ref 0.7–4.0)
MCH: 31.5 pg (ref 26.0–34.0)
MCHC: 32 g/dL (ref 30.0–36.0)
MCV: 98.5 fL (ref 80.0–100.0)
Monocytes Absolute: 0.6 10*3/uL (ref 0.1–1.0)
Monocytes Relative: 7 %
Neutro Abs: 6.8 10*3/uL (ref 1.7–7.7)
Neutrophils Relative %: 80 %
Platelets: 170 10*3/uL (ref 150–400)
RBC: 3.24 MIL/uL — ABNORMAL LOW (ref 4.22–5.81)
RDW: 14.3 % (ref 11.5–15.5)
WBC: 8.5 10*3/uL (ref 4.0–10.5)
nRBC: 0 % (ref 0.0–0.2)

## 2021-03-07 LAB — TYPE AND SCREEN
ABO/RH(D): B POS
Antibody Screen: NEGATIVE

## 2021-03-07 LAB — BASIC METABOLIC PANEL
Anion gap: 7 (ref 5–15)
BUN: 41 mg/dL — ABNORMAL HIGH (ref 8–23)
CO2: 23 mmol/L (ref 22–32)
Calcium: 9.2 mg/dL (ref 8.9–10.3)
Chloride: 111 mmol/L (ref 98–111)
Creatinine, Ser: 2.05 mg/dL — ABNORMAL HIGH (ref 0.61–1.24)
GFR, Estimated: 29 mL/min — ABNORMAL LOW (ref >60)
Glucose, Bld: 123 mg/dL — ABNORMAL HIGH (ref 70–99)
Potassium: 4.2 mmol/L (ref 3.5–5.1)
Sodium: 141 mmol/L (ref 135–145)

## 2021-03-07 LAB — PROTIME-INR
INR: 1.1 (ref 0.8–1.2)
Prothrombin Time: 13.6 seconds (ref 11.4–15.2)

## 2021-03-07 LAB — ABO/RH: ABO/RH(D): B POS

## 2021-03-07 MED ORDER — ACETAMINOPHEN 500 MG PO TABS: 500.0000 mg | ORAL_TABLET | Freq: Four times a day (QID) | ORAL | 0 refills | Status: AC | PRN

## 2021-03-07 NOTE — ED Provider Notes (Addendum)
[facility] DEPT Provider Note   CSN: [id number] Arrival date & time: 03/07/21  1417     History Chief Complaint  Patient presents with  . Fall    Fall unwitnessed     Larry Mcfarland is a 87 y.o. male.  HPI Patient presents from nursing facility after a fall.  Patient is here with EMS providers who assist with history.  Patient does have mild dementia, but answers questions about his current presentation with brief, but seemingly appropriate answers.  Level 5 caveat due to patient's history of dementia, inability to obtain longer historical details. Seemingly the patient was in his usual state of health, had a witnessed fall.  Since that time he has had pain in the proximal right lateral hip.  Patient attempted to stand again after the fall, but complained of pain in this area.  No pain distally, no head trauma, no reported loss of consciousness.  Patient denies head or neck pain, denies weakness anywhere.  No medication provided for pain relief thus far. Per EMS vital signs unremarkable in transport, heart rate 70s, pulse ox 95% room air, blood pressure 140/70.     Past Medical History:  Diagnosis Date  . Anginal pain ([place])   . Anxiety   . Bleeding from the nose 2009   cautery by Dr. Erik Obey  . BPH (benign prostatic hyperplasia)   . CAD (coronary artery disease)   . Cataract    right eye  . CKD (chronic kidney disease), stage III ([place])   . Distal radius fracture 2013   Dr. Fredna Dow  . Dysrhythmia    hx of atrial fib- 10 years ago   . GERD (gastroesophageal reflux disease)   . Gout   . H/O hiatal hernia   . Hyperlipemia   . Hypertension   . Impaired fasting glucose   . Inguinal hernia without mention of obstruction or gangrene, unilateral or unspecified, (not specified as recurrent)   . Lumbar radiculopathy   . Major depressive disorder, single episode, unspecified   . Nosebleed    most recent 05/24/13  to have  cauterized prior to surgery    . Osteoarthritis   . Osteopenia 2002  . Skin cancer of forehead    left (BCE)  . Vertigo 1999   negative ct scan head; rare recurrance, last in 2013  . Vision disturbance     Patient Active Problem List   Diagnosis Date Noted  . Mitral valve insufficiency 05/09/2019  . CKD (chronic kidney disease), stage III ([place])   . Vertigo 11/22/2017  . BPH without urinary obstruction 10/16/2014  . Hyperlipidemia 10/16/2014  . Arrhythmia 10/08/2014  . Gastrojejunostomy tube status ([place]) 10/05/2014  . Preop cardiovascular exam 09/25/2014  . HTN (hypertension) 09/25/2014  . PAF (paroxysmal atrial fibrillation) ([place]) 09/25/2014  . Hiatal hernia s/p lap Repair with G-tube Nov 2015 07/30/2014  . Right inguinal hernia 01/10/2013    Past Surgical History:  Procedure Laterality Date  . ESOPHAGOGASTRODUODENOSCOPY N/A 07/10/2014   Procedure: ESOPHAGOGASTRODUODENOSCOPY (EGD);  Surgeon: Garlan Fair, MD;  Location: [facility];  Service: Endoscopy;  Laterality: N/A;  . EYE SURGERY     right eye cataract surgery   . GASTROSTOMY N/A 10/03/2014   Procedure: GASTROSTOMY TUBE;  Surgeon: Fanny Skates, MD;  Location: [facility];  Service: General;  Laterality: N/A;  . HIATAL HERNIA REPAIR N/A 10/03/2014   Procedure: [place] HIATAL HERNIA, POSSIBLE OPEN;  Surgeon: Fanny Skates, MD;  Location: [facility];  Service: General;  Laterality: N/A;  . INGUINAL HERNIA REPAIR Right 06/07/2013   Procedure: LAPAROSCOPIC INGUINAL HERNIA REPAIR;  Surgeon: Adin Hector, MD;  Location: [facility];  Service: General;  Laterality: Right;  With Mesh  . INSERTION OF MESH Right 06/07/2013   Procedure: INSERTION OF MESH;  Surgeon: Adin Hector, MD;  Location: [facility];  Service: General;  Laterality: Right;  . TONSILLECTOMY     at age 74 yrs old       Family History  Problem Relation Age of Onset  . Heart disease Mother   . Heart disease Father   . Cancer Sister        breast     Social History    Tobacco Use  . Smoking status: Never Smoker  . Smokeless tobacco: Never Used  Vaping Use  . Vaping Use: Never used  Substance Use Topics  . Alcohol use: Yes    Comment: occasional  . Drug use: No    Home Medications Prior to Admission medications   Medication Sig Start Date End Date Taking? Authorizing Provider  ALPRAZolam (XANAX) 0.25 MG tablet Take 0.125 mg by mouth 2 (two) times daily as needed for anxiety or sleep.     [provider]  aspirin 81 MG tablet Take 81 mg by mouth daily.    [provider]  lisinopril (PRINIVIL,ZESTRIL) 10 MG tablet Take 1 tablet (10 mg total) by mouth daily. 02/04/18 01/26/19  Dorothy Spark, MD  meclizine (ANTIVERT) 12.5 MG tablet Take 1 tablet (12.5 mg total) by mouth 3 (three) times daily as needed for dizziness (Vertigo). 11/24/17   Rosita Fire, MD  metoprolol tartrate (LOPRESSOR) 25 MG tablet Take 12.5 mg by mouth 2 (two) times daily.    [provider]  omeprazole (PRILOSEC) 20 MG capsule Take 40 mg by mouth daily before breakfast.     [provider]  polyethylene glycol (MIRALAX / GLYCOLAX) packet Take 17 g by mouth every other day.    [provider]  pravastatin (PRAVACHOL) 20 MG tablet Take 20 mg by mouth at bedtime.    [provider]  tamsulosin (FLOMAX) 0.4 MG CAPS capsule Take 0.4 mg by mouth daily after supper.    [provider]    Allergies    Milk-related compounds and Penicillins  Review of Systems   Review of Systems  Unable to perform ROS: Dementia    Physical Exam Updated Vital Signs BP (!) 141/65 (BP Location: Right Arm)   Pulse 75   Temp 98.3 F (36.8 C) (Oral)   Resp 19   SpO2 96%   Physical Exam Vitals and nursing note reviewed.  Constitutional:      General: He is not in acute distress.    Appearance: He is well-developed.  HENT:     Head: Normocephalic and atraumatic.  Eyes:     Conjunctiva/sclera: Conjunctivae normal.  Neck:      Comments: No tenderness, no deformity, cervical spine collar in place. Cardiovascular:     Rate and Rhythm: Normal rate and regular rhythm.  Pulmonary:     Effort: Pulmonary effort is normal. No respiratory distress.     Breath sounds: No stridor.  Abdominal:     General: There is no distension.  Musculoskeletal:     Comments: Tender to palpation right proximal lateral thigh, unwilling to flex the hip secondary to this.  No distal deformity, patient moves his toes, ankle appropriately.  Knee unremarkable.  Left hip  unremarkable.  Skin:    General: Skin is warm and dry.  Neurological:     Mental Status: He is alert and oriented to person, place, and time.     Motor: Atrophy present. No tremor or abnormal muscle tone.     Comments: Hearing is very poor, speech is very soft, otherwise cranial nerves unremarkable.     ED Results / Procedures / Treatments   Labs (all labs ordered are listed, but only abnormal results are displayed) Labs Reviewed  BASIC METABOLIC PANEL - Abnormal; Notable for the following components:      Result Value   Glucose, Bld 123 (*)    BUN 41 (*)    Creatinine, Ser 2.05 (*)    GFR, Estimated 29 (*)    All other components within normal limits  CBC WITH DIFFERENTIAL/PLATELET - Abnormal; Notable for the following components:   RBC 3.24 (*)    Hemoglobin 10.2 (*)    HCT 31.9 (*)    Abs Immature Granulocytes 0.09 (*)    All other components within normal limits  PROTIME-INR  TYPE AND SCREEN  ABO/RH    EKG EKG Interpretation  Date/Time:  Friday March 07 2021 15:25:18 EDT Ventricular Rate:  62 PR Interval:  188 QRS Duration: 72 QT Interval:  398 QTC Calculation: 403 R Axis:   19 Text Interpretation: Normal sinus rhythm Nonspecific ST abnormality Artifact Abnormal ECG Confirmed by Carmin Muskrat [phone]) on 03/07/2021 5:28:33 PM   Radiology DG Hip Unilat With Pelvis 2-3 Views Right  Result Date: 03/07/2021 CLINICAL DATA:  Fall. EXAM: DG HIP  (WITH OR WITHOUT PELVIS) 2-3V RIGHT COMPARISON:  None. FINDINGS: Bones are diffusely demineralized. No evidence for fracture or dislocation. Evidence of prior lower abdominal wall mesh placement noted. IMPRESSION: Osteopenia without acute bony abnormality. Electronically Signed   By: Misty Stanley M.D.   On: 03/07/2021 15:53    Procedures Procedures   Medications Ordered in ED Medications - No data to display  ED Course  I have reviewed the triage vital signs and the nursing notes.  Pertinent labs & imaging results that were available during my care of the patient were reviewed by me and considered in my medical decision making (see chart for details).    4:48 PM X-ray without evidence for fracture.  Patient's ability to bend the leg is somewhat reassuring, absence of other complaints is reassuring.  No report of head trauma, no complaints of head or neck pain.    5:14 PM Patient awake and alert, moving his leg freely, bending his hip spontaneously, in an effort to go to the bathroom. Patient to return to his nursing facility.  6:14 PM I spoke with one of the patient's family friends, one of our staff physicians.  Nursing home staff is aware of the patient's impending return, Tylenol written as needed for control. Final Clinical Impression(s) / ED Diagnoses Final diagnoses:  Fall, initial encounter     Carmin Muskrat, MD 03/07/21 1649    Carmin Muskrat, MD 03/07/21 1714    Carmin Muskrat, MD 03/07/21 1818

## 2021-03-07 NOTE — Discharge Instructions (Addendum)
Today's evaluation in terms of your hip pain is generally reassuring.  Your blood work does show that you are slightly dehydrated.  It is important that you focus on oral hydration, and follow-up with your physician to have your values rechecked next week.  Return here for concerning changes in your condition.

## 2021-03-07 NOTE — ED Notes (Signed)
Spoke with dr.gould family friend informed of pts care  Informed of progress with pt's permission Pt to be transported back to facility. Request for pain med's for him non narcotic, Tylenol.   [phone]

## 2021-03-07 NOTE — ED Notes (Signed)
Spoke with tony at [employer]:  Spoke with : charity  rn - memory care dir  ------------ Message left

## 2021-03-07 NOTE — ED Notes (Signed)
Dr. Delman Cheadle, patient's friend, eye doctor and care taker said to keep him informed, [phone]. He said the patients son is in [place].

## 2021-03-07 NOTE — ED Notes (Signed)
Called [facility] for transport home  Called [facility] for report  Paper work complete and ready  ------------ Pt agitation noted. Provider informed

## 2021-03-07 NOTE — ED Triage Notes (Signed)
Pt comes to ed via ems, from nursing facility [facility], fall un witness on wood floor.  Pt verbalizes he is hurting R upper femur, leg pain/ trunk pain with standing up. Pain with walking, uses a walker at baseline. Pt PMH included dementia, HTN, no blood thinners, no lOC. C- collar in place nexus, denies neck and back pain. No shorting/ rotation  V/s on arrival  142/72, hr 74, spo2 96 room air.

## 2021-03-13 ENCOUNTER — Inpatient Hospital Stay (HOSPITAL_COMMUNITY)
Admission: EM | Admit: 2021-03-13 | Discharge: 2021-03-30 | DRG: 091 | Disposition: E | Payer: Medicare Other | Source: Skilled Nursing Facility | Attending: Internal Medicine | Admitting: Internal Medicine

## 2021-03-13 ENCOUNTER — Encounter (HOSPITAL_COMMUNITY): Payer: Self-pay

## 2021-03-13 ENCOUNTER — Observation Stay (HOSPITAL_COMMUNITY): Payer: Medicare Other

## 2021-03-13 ENCOUNTER — Emergency Department (HOSPITAL_COMMUNITY): Payer: Medicare Other

## 2021-03-13 DIAGNOSIS — E785 Hyperlipidemia, unspecified: Secondary | ICD-10-CM | POA: Diagnosis not present

## 2021-03-13 DIAGNOSIS — E43 Unspecified severe protein-calorie malnutrition: Secondary | ICD-10-CM | POA: Insufficient documentation

## 2021-03-13 DIAGNOSIS — N1832 Chronic kidney disease, stage 3b: Secondary | ICD-10-CM | POA: Diagnosis not present

## 2021-03-13 DIAGNOSIS — F0390 Unspecified dementia without behavioral disturbance: Secondary | ICD-10-CM | POA: Diagnosis not present

## 2021-03-13 DIAGNOSIS — Z79899 Other long term (current) drug therapy: Secondary | ICD-10-CM | POA: Diagnosis not present

## 2021-03-13 DIAGNOSIS — Z20822 Contact with and (suspected) exposure to covid-19: Secondary | ICD-10-CM | POA: Diagnosis present

## 2021-03-13 DIAGNOSIS — S065X9A Traumatic subdural hemorrhage with loss of consciousness of unspecified duration, initial encounter: Secondary | ICD-10-CM | POA: Diagnosis not present

## 2021-03-13 DIAGNOSIS — D181 Lymphangioma, any site: Secondary | ICD-10-CM

## 2021-03-13 DIAGNOSIS — E870 Hyperosmolality and hypernatremia: Secondary | ICD-10-CM | POA: Diagnosis present

## 2021-03-13 DIAGNOSIS — Z85828 Personal history of other malignant neoplasm of skin: Secondary | ICD-10-CM

## 2021-03-13 DIAGNOSIS — Z66 Do not resuscitate: Secondary | ICD-10-CM | POA: Diagnosis present

## 2021-03-13 DIAGNOSIS — Z7982 Long term (current) use of aspirin: Secondary | ICD-10-CM | POA: Diagnosis not present

## 2021-03-13 DIAGNOSIS — K219 Gastro-esophageal reflux disease without esophagitis: Secondary | ICD-10-CM | POA: Diagnosis not present

## 2021-03-13 DIAGNOSIS — I129 Hypertensive chronic kidney disease with stage 1 through stage 4 chronic kidney disease, or unspecified chronic kidney disease: Secondary | ICD-10-CM | POA: Diagnosis present

## 2021-03-13 DIAGNOSIS — D500 Iron deficiency anemia secondary to blood loss (chronic): Secondary | ICD-10-CM | POA: Diagnosis present

## 2021-03-13 DIAGNOSIS — R410 Disorientation, unspecified: Secondary | ICD-10-CM | POA: Diagnosis present

## 2021-03-13 DIAGNOSIS — F600 Paranoid personality disorder: Secondary | ICD-10-CM | POA: Diagnosis present

## 2021-03-13 DIAGNOSIS — I10 Essential (primary) hypertension: Secondary | ICD-10-CM | POA: Diagnosis present

## 2021-03-13 DIAGNOSIS — W19XXXA Unspecified fall, initial encounter: Secondary | ICD-10-CM | POA: Diagnosis not present

## 2021-03-13 DIAGNOSIS — F329 Major depressive disorder, single episode, unspecified: Secondary | ICD-10-CM | POA: Diagnosis present

## 2021-03-13 DIAGNOSIS — Z993 Dependence on wheelchair: Secondary | ICD-10-CM

## 2021-03-13 DIAGNOSIS — I2510 Atherosclerotic heart disease of native coronary artery without angina pectoris: Secondary | ICD-10-CM | POA: Diagnosis present

## 2021-03-13 DIAGNOSIS — G9608 Other cranial cerebrospinal fluid leak: Principal | ICD-10-CM | POA: Diagnosis present

## 2021-03-13 DIAGNOSIS — I340 Nonrheumatic mitral (valve) insufficiency: Secondary | ICD-10-CM | POA: Diagnosis present

## 2021-03-13 DIAGNOSIS — E860 Dehydration: Secondary | ICD-10-CM | POA: Diagnosis present

## 2021-03-13 DIAGNOSIS — Z880 Allergy status to penicillin: Secondary | ICD-10-CM

## 2021-03-13 DIAGNOSIS — Z515 Encounter for palliative care: Secondary | ICD-10-CM

## 2021-03-13 DIAGNOSIS — G929 Unspecified toxic encephalopathy: Secondary | ICD-10-CM | POA: Diagnosis present

## 2021-03-13 DIAGNOSIS — N183 Chronic kidney disease, stage 3 unspecified: Secondary | ICD-10-CM | POA: Diagnosis present

## 2021-03-13 DIAGNOSIS — I480 Paroxysmal atrial fibrillation: Secondary | ICD-10-CM | POA: Diagnosis present

## 2021-03-13 DIAGNOSIS — R748 Abnormal levels of other serum enzymes: Secondary | ICD-10-CM | POA: Diagnosis present

## 2021-03-13 DIAGNOSIS — I6200 Nontraumatic subdural hemorrhage, unspecified: Secondary | ICD-10-CM | POA: Diagnosis present

## 2021-03-13 DIAGNOSIS — N179 Acute kidney failure, unspecified: Secondary | ICD-10-CM | POA: Diagnosis present

## 2021-03-13 DIAGNOSIS — G9340 Encephalopathy, unspecified: Secondary | ICD-10-CM | POA: Diagnosis not present

## 2021-03-13 DIAGNOSIS — N400 Enlarged prostate without lower urinary tract symptoms: Secondary | ICD-10-CM | POA: Diagnosis present

## 2021-03-13 DIAGNOSIS — Z8249 Family history of ischemic heart disease and other diseases of the circulatory system: Secondary | ICD-10-CM | POA: Diagnosis not present

## 2021-03-13 LAB — MAGNESIUM: Magnesium: 2.3 mg/dL (ref 1.7–2.4)

## 2021-03-13 LAB — CBC WITH DIFFERENTIAL/PLATELET
Abs Immature Granulocytes: 0.06 10*3/uL (ref 0.00–0.07)
Basophils Absolute: 0 10*3/uL (ref 0.0–0.1)
Basophils Relative: 0 %
Eosinophils Absolute: 0.2 10*3/uL (ref 0.0–0.5)
Eosinophils Relative: 2 %
HCT: 29.8 % — ABNORMAL LOW (ref 39.0–52.0)
Hemoglobin: 9.3 g/dL — ABNORMAL LOW (ref 13.0–17.0)
Immature Granulocytes: 1 %
Lymphocytes Relative: 9 %
Lymphs Abs: 0.8 10*3/uL (ref 0.7–4.0)
MCH: 31.5 pg (ref 26.0–34.0)
MCHC: 31.2 g/dL (ref 30.0–36.0)
MCV: 101 fL — ABNORMAL HIGH (ref 80.0–100.0)
Monocytes Absolute: 0.9 10*3/uL (ref 0.1–1.0)
Monocytes Relative: 10 %
Neutro Abs: 7.3 10*3/uL (ref 1.7–7.7)
Neutrophils Relative %: 78 %
Platelets: 168 10*3/uL (ref 150–400)
RBC: 2.95 MIL/uL — ABNORMAL LOW (ref 4.22–5.81)
RDW: 14.6 % (ref 11.5–15.5)
WBC: 9.3 10*3/uL (ref 4.0–10.5)
nRBC: 0 % (ref 0.0–0.2)

## 2021-03-13 LAB — CK: Total CK: 603 U/L — ABNORMAL HIGH (ref 49–397)

## 2021-03-13 LAB — URINALYSIS, ROUTINE W REFLEX MICROSCOPIC
Bacteria, UA: NONE SEEN
Bilirubin Urine: NEGATIVE
Glucose, UA: NEGATIVE mg/dL
Ketones, ur: 5 mg/dL — AB
Leukocytes,Ua: NEGATIVE
Nitrite: NEGATIVE
Protein, ur: NEGATIVE mg/dL
Specific Gravity, Urine: 1.019 (ref 1.005–1.030)
pH: 5 (ref 5.0–8.0)

## 2021-03-13 LAB — BASIC METABOLIC PANEL
Anion gap: 8 (ref 5–15)
Anion gap: 10 (ref 5–15)
BUN: 43 mg/dL — ABNORMAL HIGH (ref 8–23)
BUN: 46 mg/dL — ABNORMAL HIGH (ref 8–23)
CO2: 21 mmol/L — ABNORMAL LOW (ref 22–32)
CO2: 20 mmol/L — ABNORMAL LOW (ref 22–32)
Calcium: 9 mg/dL (ref 8.9–10.3)
Calcium: 9.2 mg/dL (ref 8.9–10.3)
Chloride: 113 mmol/L — ABNORMAL HIGH (ref 98–111)
Chloride: 116 mmol/L — ABNORMAL HIGH (ref 98–111)
Creatinine, Ser: 1.73 mg/dL — ABNORMAL HIGH (ref 0.61–1.24)
Creatinine, Ser: 1.74 mg/dL — ABNORMAL HIGH (ref 0.61–1.24)
GFR, Estimated: 35 mL/min — ABNORMAL LOW (ref >60)
GFR, Estimated: 35 mL/min — ABNORMAL LOW (ref >60)
Glucose, Bld: 118 mg/dL — ABNORMAL HIGH (ref 70–99)
Glucose, Bld: 109 mg/dL — ABNORMAL HIGH (ref 70–99)
Potassium: 5.3 mmol/L — ABNORMAL HIGH (ref 3.5–5.1)
Potassium: 4 mmol/L (ref 3.5–5.1)
Sodium: 142 mmol/L (ref 135–145)
Sodium: 146 mmol/L — ABNORMAL HIGH (ref 135–145)

## 2021-03-13 LAB — PHOSPHORUS: Phosphorus: 4.1 mg/dL (ref 2.5–4.6)

## 2021-03-13 MED ORDER — SODIUM CHLORIDE 0.9 % IV BOLUS
500.0000 mL | Freq: Once | INTRAVENOUS | Status: AC
  Administered 2021-03-13: 500 mL via INTRAVENOUS

## 2021-03-13 NOTE — ED Notes (Signed)
Family at bedside. 
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
## 2021-03-13 NOTE — H&P (Signed)
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   Patient was evaluated in the context of the global COVID-19 pandemic, which necessitated consideration that the patient might be at risk for infection with the SARS-CoV-2 virus that causes COVID-19. Institutional protocols and algorithms that pertain to the evaluation of patients at risk for COVID-19 are in a state of rapid change based on information released by regulatory bodies including the CDC and federal and state organizations. These policies and algorithms were followed during the patient's care.

## 2021-03-13 NOTE — ED Notes (Signed)
EKG obtained and verified with ED provider.
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
## 2021-03-13 NOTE — ED Notes (Signed)
Pt's son at the bedside. Per the pt's son, pt fell at his nursing facility around 5 days ago and was evaluated in the ED following this fall. Per the pt's son, the pt did not have a head CT at that time. Pt's son states, the pt normally ambulates with a walker but since his fall has een sitting in a wheelchair at his nursing facility. Per the pt's son, the pt has not been "acting like his normal self" since the fall as well. This nurse relayed the above information to ED Provider, Dr. Eulis Foster.

## 2021-03-13 NOTE — ED Notes (Signed)
Provider bedside to evaluate.

## 2021-03-14 ENCOUNTER — Observation Stay (HOSPITAL_COMMUNITY): Payer: Medicare Other

## 2021-03-14 ENCOUNTER — Other Ambulatory Visit: Payer: Self-pay

## 2021-03-14 DIAGNOSIS — G929 Unspecified toxic encephalopathy: Secondary | ICD-10-CM | POA: Diagnosis present

## 2021-03-14 DIAGNOSIS — D181 Lymphangioma, any site: Secondary | ICD-10-CM | POA: Diagnosis not present

## 2021-03-14 DIAGNOSIS — I480 Paroxysmal atrial fibrillation: Secondary | ICD-10-CM | POA: Diagnosis present

## 2021-03-14 DIAGNOSIS — N400 Enlarged prostate without lower urinary tract symptoms: Secondary | ICD-10-CM

## 2021-03-14 DIAGNOSIS — G9340 Encephalopathy, unspecified: Secondary | ICD-10-CM | POA: Diagnosis not present

## 2021-03-14 DIAGNOSIS — F0390 Unspecified dementia without behavioral disturbance: Secondary | ICD-10-CM | POA: Diagnosis present

## 2021-03-14 DIAGNOSIS — N179 Acute kidney failure, unspecified: Secondary | ICD-10-CM | POA: Diagnosis present

## 2021-03-14 DIAGNOSIS — S065X9A Traumatic subdural hemorrhage with loss of consciousness of unspecified duration, initial encounter: Secondary | ICD-10-CM | POA: Diagnosis present

## 2021-03-14 DIAGNOSIS — G9608 Other cranial cerebrospinal fluid leak: Secondary | ICD-10-CM | POA: Diagnosis present

## 2021-03-14 DIAGNOSIS — E785 Hyperlipidemia, unspecified: Secondary | ICD-10-CM | POA: Diagnosis present

## 2021-03-14 DIAGNOSIS — Z515 Encounter for palliative care: Secondary | ICD-10-CM | POA: Diagnosis not present

## 2021-03-14 DIAGNOSIS — I6200 Nontraumatic subdural hemorrhage, unspecified: Secondary | ICD-10-CM | POA: Diagnosis present

## 2021-03-14 DIAGNOSIS — E860 Dehydration: Secondary | ICD-10-CM

## 2021-03-14 DIAGNOSIS — Z79899 Other long term (current) drug therapy: Secondary | ICD-10-CM | POA: Diagnosis not present

## 2021-03-14 DIAGNOSIS — I129 Hypertensive chronic kidney disease with stage 1 through stage 4 chronic kidney disease, or unspecified chronic kidney disease: Secondary | ICD-10-CM | POA: Diagnosis present

## 2021-03-14 DIAGNOSIS — Z8249 Family history of ischemic heart disease and other diseases of the circulatory system: Secondary | ICD-10-CM | POA: Diagnosis not present

## 2021-03-14 DIAGNOSIS — Z66 Do not resuscitate: Secondary | ICD-10-CM | POA: Diagnosis present

## 2021-03-14 DIAGNOSIS — D500 Iron deficiency anemia secondary to blood loss (chronic): Secondary | ICD-10-CM | POA: Diagnosis present

## 2021-03-14 DIAGNOSIS — N1832 Chronic kidney disease, stage 3b: Secondary | ICD-10-CM

## 2021-03-14 DIAGNOSIS — R410 Disorientation, unspecified: Secondary | ICD-10-CM | POA: Diagnosis present

## 2021-03-14 DIAGNOSIS — I2510 Atherosclerotic heart disease of native coronary artery without angina pectoris: Secondary | ICD-10-CM | POA: Diagnosis present

## 2021-03-14 DIAGNOSIS — Z880 Allergy status to penicillin: Secondary | ICD-10-CM | POA: Diagnosis not present

## 2021-03-14 DIAGNOSIS — I10 Essential (primary) hypertension: Secondary | ICD-10-CM

## 2021-03-14 DIAGNOSIS — E43 Unspecified severe protein-calorie malnutrition: Secondary | ICD-10-CM | POA: Diagnosis present

## 2021-03-14 DIAGNOSIS — K219 Gastro-esophageal reflux disease without esophagitis: Secondary | ICD-10-CM | POA: Diagnosis present

## 2021-03-14 DIAGNOSIS — Z85828 Personal history of other malignant neoplasm of skin: Secondary | ICD-10-CM | POA: Diagnosis not present

## 2021-03-14 DIAGNOSIS — Z7982 Long term (current) use of aspirin: Secondary | ICD-10-CM | POA: Diagnosis not present

## 2021-03-14 DIAGNOSIS — Z993 Dependence on wheelchair: Secondary | ICD-10-CM | POA: Diagnosis not present

## 2021-03-14 DIAGNOSIS — Z20822 Contact with and (suspected) exposure to covid-19: Secondary | ICD-10-CM | POA: Diagnosis present

## 2021-03-14 DIAGNOSIS — R748 Abnormal levels of other serum enzymes: Secondary | ICD-10-CM

## 2021-03-14 DIAGNOSIS — E870 Hyperosmolality and hypernatremia: Secondary | ICD-10-CM | POA: Diagnosis present

## 2021-03-14 DIAGNOSIS — W19XXXA Unspecified fall, initial encounter: Secondary | ICD-10-CM | POA: Diagnosis present

## 2021-03-14 LAB — COMPREHENSIVE METABOLIC PANEL
ALT: 27 U/L (ref 0–44)
AST: 36 U/L (ref 15–41)
Albumin: 3.1 g/dL — ABNORMAL LOW (ref 3.5–5.0)
Alkaline Phosphatase: 62 U/L (ref 38–126)
Anion gap: 10 (ref 5–15)
BUN: 33 mg/dL — ABNORMAL HIGH (ref 8–23)
CO2: 18 mmol/L — ABNORMAL LOW (ref 22–32)
Calcium: 8.7 mg/dL — ABNORMAL LOW (ref 8.9–10.3)
Chloride: 115 mmol/L — ABNORMAL HIGH (ref 98–111)
Creatinine, Ser: 1.54 mg/dL — ABNORMAL HIGH (ref 0.61–1.24)
GFR, Estimated: 41 mL/min — ABNORMAL LOW (ref >60)
Glucose, Bld: 88 mg/dL (ref 70–99)
Potassium: 3.9 mmol/L (ref 3.5–5.1)
Sodium: 143 mmol/L (ref 135–145)
Total Bilirubin: 1.2 mg/dL (ref 0.3–1.2)
Total Protein: 5.4 g/dL — ABNORMAL LOW (ref 6.5–8.1)

## 2021-03-14 LAB — CBC WITH DIFFERENTIAL/PLATELET
Abs Immature Granulocytes: 0.05 10*3/uL (ref 0.00–0.07)
Basophils Absolute: 0.1 10*3/uL (ref 0.0–0.1)
Basophils Relative: 1 %
Eosinophils Absolute: 0.4 10*3/uL (ref 0.0–0.5)
Eosinophils Relative: 4 %
HCT: 27 % — ABNORMAL LOW (ref 39.0–52.0)
Hemoglobin: 8.5 g/dL — ABNORMAL LOW (ref 13.0–17.0)
Immature Granulocytes: 1 %
Lymphocytes Relative: 15 %
Lymphs Abs: 1.3 10*3/uL (ref 0.7–4.0)
MCH: 31.6 pg (ref 26.0–34.0)
MCHC: 31.5 g/dL (ref 30.0–36.0)
MCV: 100.4 fL — ABNORMAL HIGH (ref 80.0–100.0)
Monocytes Absolute: 0.9 10*3/uL (ref 0.1–1.0)
Monocytes Relative: 11 %
Neutro Abs: 6.1 10*3/uL (ref 1.7–7.7)
Neutrophils Relative %: 68 %
Platelets: 166 10*3/uL (ref 150–400)
RBC: 2.69 MIL/uL — ABNORMAL LOW (ref 4.22–5.81)
RDW: 14.4 % (ref 11.5–15.5)
WBC: 8.8 10*3/uL (ref 4.0–10.5)
nRBC: 0 % (ref 0.0–0.2)

## 2021-03-14 LAB — MRSA PCR SCREENING: MRSA by PCR: NEGATIVE

## 2021-03-14 LAB — TSH: TSH: 0.612 u[IU]/mL (ref 0.350–4.500)

## 2021-03-14 LAB — PHOSPHORUS: Phosphorus: 3.1 mg/dL (ref 2.5–4.6)

## 2021-03-14 LAB — MAGNESIUM: Magnesium: 1.9 mg/dL (ref 1.7–2.4)

## 2021-03-14 LAB — CK: Total CK: 387 U/L (ref 49–397)

## 2021-03-14 LAB — SARS CORONAVIRUS 2 (TAT 6-24 HRS): SARS Coronavirus 2: NEGATIVE

## 2021-03-14 MED ORDER — SODIUM CHLORIDE 0.9 % IV SOLN
75.0000 mL/h | INTRAVENOUS | Status: DC
  Administered 2021-03-14: 75 mL/h via INTRAVENOUS

## 2021-03-14 MED ORDER — OLANZAPINE 5 MG PO TBDP
5.0000 mg | ORAL_TABLET | Freq: Once | ORAL | Status: AC
  Administered 2021-03-14: 5 mg via ORAL
  Filled 2021-03-14 (×2): qty 1

## 2021-03-14 MED ORDER — ADULT MULTIVITAMIN W/MINERALS CH
1.0000 | ORAL_TABLET | Freq: Every day | ORAL | Status: DC
  Administered 2021-03-15: 1 via ORAL
  Filled 2021-03-14 (×2): qty 1

## 2021-03-14 MED ORDER — METOPROLOL TARTRATE 12.5 MG HALF TABLET
12.5000 mg | ORAL_TABLET | Freq: Two times a day (BID) | ORAL | Status: DC
  Administered 2021-03-14 – 2021-03-15 (×3): 12.5 mg via ORAL
  Filled 2021-03-14 (×3): qty 1

## 2021-03-14 MED ORDER — MIRTAZAPINE 15 MG PO TABS
7.5000 mg | ORAL_TABLET | Freq: Every day | ORAL | Status: DC
  Administered 2021-03-14: 7.5 mg via ORAL
  Filled 2021-03-14: qty 1

## 2021-03-14 MED ORDER — QUETIAPINE 12.5 MG HALF TABLET
12.5000 mg | ORAL_TABLET | Freq: Two times a day (BID) | ORAL | Status: DC
  Administered 2021-03-14 – 2021-03-15 (×3): 12.5 mg via ORAL
  Filled 2021-03-14 (×3): qty 1

## 2021-03-14 MED ORDER — HALOPERIDOL LACTATE 5 MG/ML IJ SOLN
1.0000 mg | Freq: Four times a day (QID) | INTRAMUSCULAR | Status: DC | PRN
  Administered 2021-03-14: 1 mg via INTRAVENOUS
  Filled 2021-03-14: qty 1

## 2021-03-14 MED ORDER — ACETAMINOPHEN 650 MG RE SUPP
650.0000 mg | Freq: Four times a day (QID) | RECTAL | Status: DC | PRN
  Filled 2021-03-14: qty 1

## 2021-03-14 MED ORDER — ACETAMINOPHEN 325 MG PO TABS: 650.0000 mg | ORAL_TABLET | Freq: Four times a day (QID) | ORAL | Status: DC | PRN

## 2021-03-14 MED ORDER — ENSURE ENLIVE PO LIQD: 237.0000 mL | Freq: Two times a day (BID) | ORAL | Status: DC

## 2021-03-14 MED ORDER — HYDROCODONE-ACETAMINOPHEN 5-325 MG PO TABS: 1.0000 | ORAL_TABLET | ORAL | Status: DC | PRN

## 2021-03-14 MED ORDER — PRAVASTATIN SODIUM 10 MG PO TABS: 20.0000 mg | ORAL_TABLET | Freq: Every day | ORAL | Status: DC

## 2021-03-14 MED ORDER — SENNA 8.6 MG PO TABS
1.0000 | ORAL_TABLET | Freq: Two times a day (BID) | ORAL | Status: DC
  Administered 2021-03-14 – 2021-03-15 (×3): 8.6 mg via ORAL
  Filled 2021-03-14 (×3): qty 1

## 2021-03-14 MED ORDER — HYDRALAZINE HCL 20 MG/ML IJ SOLN
5.0000 mg | INTRAMUSCULAR | Status: DC | PRN
  Administered 2021-03-14 – 2021-03-15 (×3): 5 mg via INTRAVENOUS
  Filled 2021-03-14 (×3): qty 1

## 2021-03-14 MED ORDER — TAMSULOSIN HCL 0.4 MG PO CAPS
0.4000 mg | ORAL_CAPSULE | Freq: Every day | ORAL | Status: DC
  Administered 2021-03-14 – 2021-03-15 (×2): 0.4 mg via ORAL
  Filled 2021-03-14 (×2): qty 1
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
## 2021-03-14 NOTE — Progress Notes (Signed)
Patient arrived to 4N room 6 via [facility] transfer ambulance.  Patient arrived conversant, appeared in NAD, denies any pain.  CHG bath completed, MRSA swab completed, sacral foam placed, SCDs on. Patient's son and son's wife at bedside to help orient patient and for patient safety. Bed in lowest position, call bell within reach and bed alarm on.  Belongings include glasses and watch on patient's wrist.

## 2021-03-14 NOTE — Progress Notes (Signed)
  Speech Language Pathology    Patient Details Name: Larry Mcfarland MRN: [id number] DOB: 1921/09/28 Today's Date: 03/14/2021 Time:  -     Entered pt's room and he was extremely restless, taking off gown, confused although fairly redirectable. Pt's son and daughter (?) present who report pt consumed liquids and solids earlier without difficultly and no hx of dysphagia. Swallow assessment is not needed presently- family in agreement.  Therapist did gave strategies to assist with pt while he is in this  mode of significant confusion. Son stated pt has dementia that has become worse over past 2 months.                     GO                Houston Siren 03/14/2021, 4:48 PM
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
## 2021-03-15 DIAGNOSIS — D181 Lymphangioma, any site: Secondary | ICD-10-CM | POA: Diagnosis not present

## 2021-03-15 DIAGNOSIS — E43 Unspecified severe protein-calorie malnutrition: Secondary | ICD-10-CM | POA: Insufficient documentation

## 2021-03-15 DIAGNOSIS — G9340 Encephalopathy, unspecified: Secondary | ICD-10-CM | POA: Diagnosis not present

## 2021-03-15 DIAGNOSIS — G9608 Other cranial cerebrospinal fluid leak: Secondary | ICD-10-CM | POA: Diagnosis not present

## 2021-03-15 DIAGNOSIS — N179 Acute kidney failure, unspecified: Secondary | ICD-10-CM | POA: Diagnosis not present

## 2021-03-15 MED ORDER — LORAZEPAM 2 MG/ML IJ SOLN: 1.0000 mg | Freq: Four times a day (QID) | INTRAMUSCULAR | Status: DC | PRN

## 2021-03-15 MED ORDER — ONDANSETRON HCL 4 MG/2ML IJ SOLN
4.0000 mg | INTRAMUSCULAR | Status: DC | PRN
  Administered 2021-03-15 – 2021-03-16 (×2): 4 mg via INTRAVENOUS
  Filled 2021-03-15 (×2): qty 2

## 2021-03-15 NOTE — Progress Notes (Signed)
Engineer, maintenance [facility]) Hospital Liaison note.   Received request from [facility] for family interest in [facility].   [facility] is unable to offer a room today. Hospital Liaison will follow up tomorrow or sooner if a room becomes available. Spoke to son Girard Cooter who confirms interest and would just like his father to be in the best hands at [facility]. He understands that there may not be a bed offer until Monday or Tuesday.   Please do not hesitate to call with questions.   Thank you,  Clementeen Hoof, BSN, RN [phone]

## 2021-03-15 NOTE — TOC Initial Note (Signed)
Transition of Care [facility]) - Initial/Assessment Note    Patient Details  Name: Larry Mcfarland MRN: [id number] Date of Birth: August 21, 1921  Transition of Care [facility]) CM/SW Contact:    Larry Milch, LCSW Phone Number: 03/15/2021, 10:55 AM  Clinical Narrative:     CSW contacted by attending that patient's disposition has changed from returning to his ALF to [place]. CSW spoke with patient's son Larry Mcfarland who reported they wanted [facility] as they have heard a lot of good things about the facility. CSW contacted Larry Mcfarland with [facility] to initiate referral and she will review the chart and reach out to family. CSW notes at this time there is no anticipated bed availability until Monday or Tuesday.                Expected Discharge Plan: Hospice Medical Facility Barriers to Discharge: Hospice Bed not available   Patient Goals and CMS Choice   CMS Medicare.gov Compare Post Acute Care list provided to:: Patient Represenative (must comment)    Expected Discharge Plan and Services Expected Discharge Plan: [place]                                              Prior Living Arrangements/Services   Lives with:: Facility Resident Patient language and need for interpreter reviewed:: Yes        Need for Family Participation in Patient Care: Yes (Comment) Care giver support system in place?: Yes (comment)   Criminal Activity/Legal Involvement Pertinent to Current Situation/Hospitalization: No - Comment as needed  Activities of Daily Living Home Assistive Devices/Equipment: Eyeglasses,Walker (specify type),Wheelchair,Hearing aid (wears bilateral hearing aides) ADL Screening (condition at time of admission) Patient's cognitive ability adequate to safely complete daily activities?: No Is the patient deaf or have difficulty hearing?: Yes (wears bilateral hearing aides) Does the patient have difficulty seeing, even when wearing glasses/contacts?:  No Does the patient have difficulty concentrating, remembering, or making decisions?: Yes Patient able to express need for assistance with ADLs?: Yes Does the patient have difficulty dressing or bathing?: Yes Independently performs ADLs?: No Communication: Independent Dressing (OT): Needs assistance Is this a change from baseline?: Change from baseline, expected to last >3 days Grooming: Needs assistance Is this a change from baseline?: Pre-admission baseline Feeding: Independent Bathing: Independent Toileting: Needs assistance Is this a change from baseline?: Pre-admission baseline In/Out Bed: Needs assistance Is this a change from baseline?: Pre-admission baseline Walks in Home: Needs assistance Is this a change from baseline?: Pre-admission baseline Does the patient have difficulty walking or climbing stairs?: Yes Weakness of Legs: Both Weakness of Arms/Hands: Both  Permission Sought/Granted                  Emotional Assessment              Admission diagnosis:  Confusion [R41.0] Hypernatremia [E87.0] Hygroma [D18.1] Traumatic subdural hygroma [G96.08] Acute encephalopathy [G93.40] AKI (acute kidney injury) ([place]) [N17.9] Subdural bleeding ([place]) [I62.00] Patient Active Problem List   Diagnosis Date Noted  . Protein-calorie malnutrition, severe 03/15/2021  . Traumatic subdural hygroma 03/28/2021  . Iron deficiency anemia due to chronic blood loss 03/07/2021  . Elevated CK 03/28/2021  . Dehydration 03/11/2021  . Mitral valve insufficiency 05/09/2019  . CKD (chronic kidney disease), stage III ([place])   . Vertigo 11/22/2017  . BPH without urinary obstruction 10/16/2014  .  Hyperlipidemia 10/16/2014  . Arrhythmia 10/08/2014  . Gastrojejunostomy tube status ([place]) 10/05/2014  . Preop cardiovascular exam 09/25/2014  . HTN (hypertension) 09/25/2014  . PAF (paroxysmal atrial fibrillation) ([place]) 09/25/2014  . Hiatal hernia s/p lap Repair with G-tube Nov 2015  07/30/2014  . Right inguinal hernia 01/10/2013   PCP:  Larry Carol, MD Pharmacy:   [place], [place] - [address] [address] Phone: [phone] Fax: [place], [place] - [address] [place] [address] Phone: [phone] Fax: [phone]     Social Determinants of Health (SDOH) Interventions    Readmission Risk Interventions No flowsheet data found.
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
## 2021-03-15 NOTE — Consult Note (Signed)
Reason for Consult: Subdural hygroma Referring Physician: Dr. Sharyn Lull is an 87 y.o. male.  HPI: Patient is a 87 year old individual who has had a couple of falls in the last several days and has become progressively more confused.  He was brought to the emergency room where CT scan was performed and this demonstrates presence of a large hemispheric subdural hygroma this is particularly prominent around the temporal tip and there is approximately 3 to 4 mm of midline shift.  The CT scan also notes that the patient has substantial cortical atrophy.  I was asked to consult on this patient to see if there is anything that could be done to help the situation.  I had discussed the situation with Dr. Wynelle Cleveland and noted that no surgery is indicated for this subdural hygroma and that will likely not make the patient's dementia status any better and it can certainly make things worse converting the subdural hygroma into a subdural hematoma.  I then spent approximately 15 minutes discussing the situation with the patient's family namely his son and daughter-in-law and a second son by phone.  I explained to them the nature of the subdural hygroma the nature of his dementia with a significant cortical atrophy and why we would not want to do any invasive procedures.  Past Medical History:  Diagnosis Date  . Anginal pain ([place])   . Anxiety   . Bleeding from the nose 2009   cautery by Dr. Erik Obey  . BPH (benign prostatic hyperplasia)   . CAD (coronary artery disease)   . Cataract    right eye  . CKD (chronic kidney disease), stage III ([place])   . Distal radius fracture 2013   Dr. Fredna Dow  . Dysrhythmia    hx of atrial fib- 10 years ago   . GERD (gastroesophageal reflux disease)   . Gout   . H/O hiatal hernia   . Hyperlipemia   . Hypertension   . Impaired fasting glucose   . Inguinal hernia without mention of obstruction or gangrene, unilateral or unspecified, (not specified as recurrent)    . Lumbar radiculopathy   . Major depressive disorder, single episode, unspecified   . Nosebleed    most recent 05/24/13  to have  cauterized prior to surgery   . Osteoarthritis   . Osteopenia 2002  . Skin cancer of forehead    left (BCE)  . Vertigo 1999   negative ct scan head; rare recurrance, last in 2013  . Vision disturbance     Past Surgical History:  Procedure Laterality Date  . ESOPHAGOGASTRODUODENOSCOPY N/A 07/10/2014   Procedure: ESOPHAGOGASTRODUODENOSCOPY (EGD);  Surgeon: Garlan Fair, MD;  Location: [facility];  Service: Endoscopy;  Laterality: N/A;  . EYE SURGERY     right eye cataract surgery   . GASTROSTOMY N/A 10/03/2014   Procedure: GASTROSTOMY TUBE;  Surgeon: Fanny Skates, MD;  Location: [facility];  Service: General;  Laterality: N/A;  . HIATAL HERNIA REPAIR N/A 10/03/2014   Procedure: [place], POSSIBLE OPEN;  Surgeon: Fanny Skates, MD;  Location: [facility];  Service: General;  Laterality: N/A;  . INGUINAL HERNIA REPAIR Right 06/07/2013   Procedure: LAPAROSCOPIC INGUINAL HERNIA REPAIR;  Surgeon: Adin Hector, MD;  Location: [facility];  Service: General;  Laterality: Right;  With Mesh  . INSERTION OF MESH Right 06/07/2013   Procedure: INSERTION OF MESH;  Surgeon: Adin Hector, MD;  Location: [facility];  Service: General;  Laterality: Right;  .  TONSILLECTOMY     at age 35 yrs old    Family History  Problem Relation Age of Onset  . Heart disease Mother   . Heart disease Father   . Cancer Sister        breast     Social History:  reports that he has never smoked. He has never used smokeless tobacco. He reports current alcohol use. He reports that he does not use drugs.  Allergies:    Medications: I have reviewed the patient's current medications.  Results for orders placed or performed during the hospital encounter of 03/08/2021 (from the past 48 hour(s))  Basic metabolic panel     Status: Abnormal   Collection Time: 03/01/2021   1:25 PM  Result Value Ref Range   Sodium 142 135 - 145 mmol/L   Potassium 5.3 (H) 3.5 - 5.1 mmol/L   Chloride 113 (H) 98 - 111 mmol/L   CO2 21 (L) 22 - 32 mmol/L   Glucose, Bld 118 (H) 70 - 99 mg/dL    Comment: Glucose reference range applies only to samples taken after fasting for at least 8 hours.   BUN 43 (H) 8 - 23 mg/dL   Creatinine, Ser 1.73 (H) 0.61 - 1.24 mg/dL   Calcium 9.0 8.9 - 10.3 mg/dL   GFR, Estimated 35 (L) >60 mL/min    Comment: (NOTE) Calculated using the CKD-EPI Creatinine Equation (2021)    Anion gap 8 5 - 15    Comment: Performed at [facility], [place] [address], [place], [place] [address]  CBC with Differential     Status: Abnormal   Collection Time: 03/15/2021  1:25 PM  Result Value Ref Range   WBC 9.3 4.0 - 10.5 K/uL   RBC 2.95 (L) 4.22 - 5.81 MIL/uL   Hemoglobin 9.3 (L) 13.0 - 17.0 g/dL   HCT 29.8 (L) 39.0 - 52.0 %   MCV 101.0 (H) 80.0 - 100.0 fL   MCH 31.5 26.0 - 34.0 pg   MCHC 31.2 30.0 - 36.0 g/dL   RDW 14.6 11.5 - 15.5 %   Platelets 168 150 - 400 K/uL   nRBC 0.0 0.0 - 0.2 %   Neutrophils Relative % 78 %   Neutro Abs 7.3 1.7 - 7.7 K/uL   Lymphocytes Relative 9 %   Lymphs Abs 0.8 0.7 - 4.0 K/uL   Monocytes Relative 10 %   Monocytes Absolute 0.9 0.1 - 1.0 K/uL   Eosinophils Relative 2 %   Eosinophils Absolute 0.2 0.0 - 0.5 K/uL   Basophils Relative 0 %   Basophils Absolute 0.0 0.0 - 0.1 K/uL   Immature Granulocytes 1 %   Abs Immature Granulocytes 0.06 0.00 - 0.07 K/uL    Comment: Performed at [facility], [place] [address], [place], [place] [address]  Urinalysis, Routine w reflex microscopic Urine, Catheterized     Status: Abnormal   Collection Time: 03/02/2021  1:38 PM  Result Value Ref Range   Color, Urine YELLOW YELLOW   APPearance CLEAR CLEAR   Specific Gravity, Urine 1.019 1.005 - 1.030   pH 5.0 5.0 - 8.0   Glucose, UA NEGATIVE NEGATIVE mg/dL   Hgb urine dipstick MODERATE (A) NEGATIVE   Bilirubin Urine  NEGATIVE NEGATIVE   Ketones, ur 5 (A) NEGATIVE mg/dL   Protein, ur NEGATIVE NEGATIVE mg/dL   Nitrite NEGATIVE NEGATIVE   Leukocytes,Ua NEGATIVE NEGATIVE   RBC / HPF 11-20 0 - 5 RBC/hpf   WBC, UA  6-10 0 - 5 WBC/hpf   Bacteria, UA NONE SEEN NONE SEEN   Mucus PRESENT    Non Squamous Epithelial 6-10 (A) NONE SEEN    Comment: Performed at [facility], [place] [address], [place], [place] [address]  Basic metabolic panel     Status: Abnormal   Collection Time: 03/05/2021  3:00 PM  Result Value Ref Range   Sodium 146 (H) 135 - 145 mmol/L   Potassium 4.0 3.5 - 5.1 mmol/L    Comment: DELTA CHECK NOTED   Chloride 116 (H) 98 - 111 mmol/L   CO2 20 (L) 22 - 32 mmol/L   Glucose, Bld 109 (H) 70 - 99 mg/dL    Comment: Glucose reference range applies only to samples taken after fasting for at least 8 hours.   BUN 46 (H) 8 - 23 mg/dL   Creatinine, Ser 1.74 (H) 0.61 - 1.24 mg/dL   Calcium 9.2 8.9 - 10.3 mg/dL   GFR, Estimated 35 (L) >60 mL/min    Comment: (NOTE) Calculated using the CKD-EPI Creatinine Equation (2021)    Anion gap 10 5 - 15    Comment: Performed at [facility], [place] [address], [place], [place] [address]  CK     Status: Abnormal   Collection Time: 03/10/2021  3:00 PM  Result Value Ref Range   Total CK 603 (H) 49 - 397 U/L    Comment: Performed at [facility], [place] [address], [place], [place] [address]  Magnesium     Status: None   Collection Time: 03/04/2021  3:00 PM  Result Value Ref Range   Magnesium 2.3 1.7 - 2.4 mg/dL    Comment: Performed at [facility], [place] [address], [place], [place] [address]  Phosphorus     Status: None   Collection Time: 03/21/2021  3:00 PM  Result Value Ref Range   Phosphorus 4.1 2.5 - 4.6 mg/dL    Comment: Performed at [facility], [place] [address], [place], [place] [address]  SARS CORONAVIRUS 2 (TAT 6-24 HRS) Nasopharyngeal Nasopharyngeal Swab     Status: None    Collection Time: 03/01/2021  6:30 PM   Specimen: Nasopharyngeal Swab  Result Value Ref Range   SARS Coronavirus 2 NEGATIVE NEGATIVE    Comment: (NOTE) SARS-CoV-2 target nucleic acids are NOT DETECTED.  The SARS-CoV-2 RNA is generally detectable in upper and lower respiratory specimens during the acute phase of infection. Negative results do not preclude SARS-CoV-2 infection, do not rule out co-infections with other pathogens, and should not be used as the sole basis for treatment or other patient management decisions. Negative results must be combined with clinical observations, patient history, and epidemiological information. The expected result is Negative.  Fact Sheet for Patients: SugarRoll.be  Fact Sheet for Healthcare Providers: https://www.woods-mathews.com/  This test is not yet approved or cleared by the [place] FDA and  has been authorized for detection and/or diagnosis of SARS-CoV-2 by FDA under an Emergency Use Authorization (EUA). This EUA will remain  in effect (meaning this test can be used) for the duration of the COVID-19 declaration under Se ction 564(b)(1) of the Act, 21 U.S.C. section 360bbb-3(b)(1), unless the authorization is terminated or revoked sooner.  Performed at [facility], [place] [address], [place], [place] [address]   MRSA PCR Screening     Status: None   Collection Time: 03/14/21 12:49 AM   Specimen: Nasal Mucosa; Nasopharyngeal  Result Value Ref  Range   MRSA by PCR NEGATIVE NEGATIVE    Comment:        The GeneXpert MRSA Assay (FDA approved for NASAL specimens only), is one component of a comprehensive MRSA colonization surveillance program. It is not intended to diagnose MRSA infection nor to guide or monitor treatment for MRSA infections. Performed at [facility], [place] [address] [place], [place] [address]   CK     Status: None   Collection Time: 03/14/21  2:51 AM  Result  Value Ref Range   Total CK 387 49 - 397 U/L    Comment: Performed at [facility], [place] [address], [place], [place] [address]  Magnesium     Status: None   Collection Time: 03/14/21  2:51 AM  Result Value Ref Range   Magnesium 1.9 1.7 - 2.4 mg/dL    Comment: Performed at [place] [address] [place], [place] [address]  Phosphorus     Status: None   Collection Time: 03/14/21  2:51 AM  Result Value Ref Range   Phosphorus 3.1 2.5 - 4.6 mg/dL    Comment: Performed at [place] [address], [place], [place] [address]  CBC WITH DIFFERENTIAL     Status: Abnormal   Collection Time: 03/14/21  2:51 AM  Result Value Ref Range   WBC 8.8 4.0 - 10.5 K/uL   RBC 2.69 (L) 4.22 - 5.81 MIL/uL   Hemoglobin 8.5 (L) 13.0 - 17.0 g/dL   HCT 27.0 (L) 39.0 - 52.0 %   MCV 100.4 (H) 80.0 - 100.0 fL   MCH 31.6 26.0 - 34.0 pg   MCHC 31.5 30.0 - 36.0 g/dL   RDW 14.4 11.5 - 15.5 %   Platelets 166 150 - 400 K/uL   nRBC 0.0 0.0 - 0.2 %   Neutrophils Relative % 68 %   Neutro Abs 6.1 1.7 - 7.7 K/uL   Lymphocytes Relative 15 %   Lymphs Abs 1.3 0.7 - 4.0 K/uL   Monocytes Relative 11 %   Monocytes Absolute 0.9 0.1 - 1.0 K/uL   Eosinophils Relative 4 %   Eosinophils Absolute 0.4 0.0 - 0.5 K/uL   Basophils Relative 1 %   Basophils Absolute 0.1 0.0 - 0.1 K/uL   Immature Granulocytes 1 %   Abs Immature Granulocytes 0.05 0.00 - 0.07 K/uL    Comment: Performed at [facility], [address] [place], [place] [address]  TSH     Status: None   Collection Time: 03/14/21  2:51 AM  Result Value Ref Range   TSH 0.612 0.350 - 4.500 uIU/mL    Comment: Performed by a 3rd Generation assay with a functional sensitivity of <=0.01 uIU/mL. Performed at [facility], [place] [address], [place], [place] [address]   Comprehensive metabolic panel     Status: Abnormal   Collection Time: 03/14/21  2:51 AM  Result Value Ref Range   Sodium 143 135 - 145 mmol/L   Potassium 3.9 3.5 - 5.1  mmol/L   Chloride 115 (H) 98 - 111 mmol/L   CO2 18 (L) 22 - 32 mmol/L   Glucose, Bld 88 70 - 99 mg/dL    Comment: Glucose reference range applies only to samples taken after fasting for at least 8 hours.   BUN 33 (H) 8 - 23 mg/dL   Creatinine, Ser 1.54 (H) 0.61 - 1.24 mg/dL   Calcium 8.7 (L) 8.9 - 10.3 mg/dL  Total Protein 5.4 (L) 6.5 - 8.1 g/dL   Albumin 3.1 (L) 3.5 - 5.0 g/dL   AST 36 15 - 41 U/L   ALT 27 0 - 44 U/L   Alkaline Phosphatase 62 38 - 126 U/L   Total Bilirubin 1.2 0.3 - 1.2 mg/dL   GFR, Estimated 41 (L) >60 mL/min    Comment: (NOTE) Calculated using the CKD-EPI Creatinine Equation (2021)    Anion gap 10 5 - 15    Comment: Performed at [place] [address] [place], [place] [address]    DG Pelvis 1-2 Views  Result Date: 03/27/2021 CLINICAL DATA:  Right-sided pelvic and leg pain post fall. EXAM: PELVIS - 1-2 VIEW COMPARISON:  None. FINDINGS: There is no evidence of pelvic fracture or diastasis. No pelvic bone lesions are seen. Osteoarthritic changes of the lower lumbosacral spine. Postsurgical changes noted in the pelvis. IMPRESSION: 1. No acute fracture or dislocation identified about the pelvis. 2. Osteoarthritic changes of the lower lumbosacral spine. Electronically Signed   By: Fidela Salisbury M.D.   On: 03/10/2021 16:46   CT HEAD WO CONTRAST  Result Date: 03/14/2021 CLINICAL DATA:  Subdural hematoma EXAM: CT HEAD WITHOUT CONTRAST TECHNIQUE: Contiguous axial images were obtained from the base of the skull through the vertex without intravenous contrast. COMPARISON:  03/02/2021 FINDINGS: Brain: Left extra-axial fluid collection subjacent to the left calvarium in keeping with a a chronic subdural hematoma or subdural hygroma is stable, measuring roughly 11 mm in thickness. There is unchanged mass effect upon the left cerebral hemisphere with resultant 5-6 mm left right midline shift. No interval hemorrhage. Moderate parenchymal volume loss is commensurate  with the patient's age. Mild periventricular white matter changes are present likely reflecting the sequela of small vessel ischemia. No acute infarct. No abnormal intra or extra-axial mass lesion. Ventricular size is normal. The cerebellum is unremarkable. Vascular: No asymmetric hyperdense vasculature at the skull base. Extensive atherosclerotic calcification noted within the carotid siphons. Skull: Intact Sinuses/Orbits: The paranasal sinuses are clear. The orbits are unremarkable. Other: The mastoid air cells and middle ear cavities are clear. IMPRESSION: Stable left chronic subdural hematoma or subdural hygroma with unchanged mass effect upon the left cerebral hemisphere and stable 5-6 mm left-to-right midline shift. No interval hemorrhage. Electronically Signed   By: Fidela Salisbury MD   On: 03/14/2021 06:19   CT Head Wo Contrast  Addendum Date: 03/15/2021   ADDENDUM REPORT: 03/04/2021 16:21 ADDENDUM: These results were called by telephone at the time of interpretation on 03/20/2021 at 4:15pm to provider [facility] , who verbally acknowledged these results. Electronically Signed   By: Jerilynn Mages.  Shick M.D.   On: 03/29/2021 16:21   Result Date: 03/02/2021 CLINICAL DATA:  Recent fall, change in mental status EXAM: CT HEAD WITHOUT CONTRAST CT CERVICAL SPINE WITHOUT CONTRAST TECHNIQUE: Multidetector CT imaging of the head and cervical spine was performed following the standard protocol without intravenous contrast. Multiplanar CT image reconstructions of the cervical spine were also generated. COMPARISON:  11/04/2011 FINDINGS: CT HEAD FINDINGS Brain: Left hemispheric CSF attenuation extra-axial fluid collection, new since the remote comparison. There is slight mass effect with some degree of sulcal effacement of the left cerebral hemisphere and rightward midline shift measuring 4 mm. No hyperdense hemorrhage appreciated. Appearance consistent with acute left subdural hygroma. Stable brain atrophy pattern and  chronic white matter microvascular ischemic changes. No ventricular enlargement. Cisterns remain patent. Cerebellar atrophy as well. Stable chronic left middle cranial fossa arachnoid cyst.  Vascular: No hyperdense vessel or unexpected calcification. Skull: Normal. Negative for fracture or focal lesion. Sinuses/Orbits: No acute orbital finding. Minor scattered sinus mucosal thickening. Mastoids are clear. Other: None. CT CERVICAL SPINE FINDINGS Alignment: Stable alignment with chronic anterolisthesis of C4 upon C5 measuring 6 mm. Similar advanced cervical degenerative disc disease from C4 through T1. Multilevel posterior facet arthropathy, most severe at C4-5. Skull base and vertebrae: No acute osseous finding or fracture. Preserved vertebral body heights. Subluxation or dislocation. Intact odontoid. Soft tissues and spinal canal: No prevertebral fluid or swelling. No visible canal hematoma. Carotid atherosclerosis noted. Disc levels: Multilevel degenerative disc disease and facet arthropathy as detailed. Upper chest: No acute finding Other: None. IMPRESSION: Diffuse left subdural hygroma having an acute appearance with slight mass effect on the left cerebral hemisphere and 4 mm of rightward midline shift. Stable atrophy and chronic white matter microvascular ischemic changes. No hyperdense acute intracranial hemorrhage. Stable advanced cervical degenerative changes, multilevel facet arthropathy, and chronic C4 on C5 anterolisthesis as detailed above. No acute osseous finding, definite fracture, or interval change by CT. Electronically Signed: By: Jerilynn Mages.  Shick M.D. On: 03/20/2021 16:16   CT Cervical Spine Wo Contrast  Addendum Date: 03/24/2021   ADDENDUM REPORT: 03/12/2021 16:21 ADDENDUM: These results were called by telephone at the time of interpretation on 02/28/2021 at 4:15pm to provider [facility] , who verbally acknowledged these results. Electronically Signed   By: Jerilynn Mages.  Shick M.D.   On: 03/09/2021 16:21    Result Date: 03/26/2021 CLINICAL DATA:  Recent fall, change in mental status EXAM: CT HEAD WITHOUT CONTRAST CT CERVICAL SPINE WITHOUT CONTRAST TECHNIQUE: Multidetector CT imaging of the head and cervical spine was performed following the standard protocol without intravenous contrast. Multiplanar CT image reconstructions of the cervical spine were also generated. COMPARISON:  11/04/2011 FINDINGS: CT HEAD FINDINGS Brain: Left hemispheric CSF attenuation extra-axial fluid collection, new since the remote comparison. There is slight mass effect with some degree of sulcal effacement of the left cerebral hemisphere and rightward midline shift measuring 4 mm. No hyperdense hemorrhage appreciated. Appearance consistent with acute left subdural hygroma. Stable brain atrophy pattern and chronic white matter microvascular ischemic changes. No ventricular enlargement. Cisterns remain patent. Cerebellar atrophy as well. Stable chronic left middle cranial fossa arachnoid cyst. Vascular: No hyperdense vessel or unexpected calcification. Skull: Normal. Negative for fracture or focal lesion. Sinuses/Orbits: No acute orbital finding. Minor scattered sinus mucosal thickening. Mastoids are clear. Other: None. CT CERVICAL SPINE FINDINGS Alignment: Stable alignment with chronic anterolisthesis of C4 upon C5 measuring 6 mm. Similar advanced cervical degenerative disc disease from C4 through T1. Multilevel posterior facet arthropathy, most severe at C4-5. Skull base and vertebrae: No acute osseous finding or fracture. Preserved vertebral body heights. Subluxation or dislocation. Intact odontoid. Soft tissues and spinal canal: No prevertebral fluid or swelling. No visible canal hematoma. Carotid atherosclerosis noted. Disc levels: Multilevel degenerative disc disease and facet arthropathy as detailed. Upper chest: No acute finding Other: None. IMPRESSION: Diffuse left subdural hygroma having an acute appearance with slight mass effect  on the left cerebral hemisphere and 4 mm of rightward midline shift. Stable atrophy and chronic white matter microvascular ischemic changes. No hyperdense acute intracranial hemorrhage. Stable advanced cervical degenerative changes, multilevel facet arthropathy, and chronic C4 on C5 anterolisthesis as detailed above. No acute osseous finding, definite fracture, or interval change by CT. Electronically Signed: By: Jerilynn Mages.  Shick M.D. On: 03/22/2021 16:16   DG CHEST PORT 1 VIEW  Result Date: 03/06/2021 CLINICAL  DATA:  Acute encephalopathy. EXAM: PORTABLE CHEST 1 VIEW COMPARISON:  January 26, 2019 FINDINGS: The study is limited secondary to patient rotation. There is no evidence of acute infiltrate, pleural effusion or pneumothorax. The cardiac silhouette is moderately enlarged. Degenerative changes seen throughout the thoracic spine. IMPRESSION: Cardiomegaly without evidence of acute or active cardiopulmonary disease. Electronically Signed   By: Virgina Norfolk M.D.   On: 03/24/2021 20:21    Review of Systems  Unable to perform ROS: Dementia   Blood pressure (!) 177/90, pulse 76, temperature (!) 97.2 F (36.2 C), temperature source Axillary, resp. rate (P) 15, height 5\' 7"  (1.702 m), weight 57.6 kg, SpO2 99 %. Physical Exam Constitutional:      Appearance: Normal appearance. He is normal weight.  HENT:     Head: Normocephalic and atraumatic.     Nose: Nose normal.     Mouth/Throat:     Mouth: Mucous membranes are moist.  Eyes:     Extraocular Movements: Extraocular movements intact.     Pupils: Pupils are equal, round, and reactive to light.  Cardiovascular:     Rate and Rhythm: Normal rate and regular rhythm.     Pulses: Normal pulses.     Heart sounds: Normal heart sounds.  Pulmonary:     Effort: Pulmonary effort is normal.     Breath sounds: Normal breath sounds.  Abdominal:     General: Abdomen is flat.     Palpations: Abdomen is soft.  Musculoskeletal:     Cervical back: Normal  range of motion and neck supple.  Skin:    General: Skin is warm and dry.     Capillary Refill: Capillary refill takes less than 2 seconds.  Neurological:     Mental Status: He is alert.     Comments: The patient is arousable and conversant but clearly confused as to his place person and he may actually be having some visual hallucinations.  He will answer some simple questions and will follow commands simply but he is picking at things in the air with his upper extremities.     Assessment/Plan: Subdural hygroma with dementia.  Plan no surgical intervention is planned.  I discussed the situation with the patient's family and they are in concurrence.  Larry Mcfarland 03/15/2021, 12:41 AM

## 2021-03-16 DIAGNOSIS — G9608 Other cranial cerebrospinal fluid leak: Secondary | ICD-10-CM | POA: Diagnosis not present

## 2021-03-16 MED ORDER — SCOPOLAMINE 1 MG/3DAYS TD PT72
1.0000 | MEDICATED_PATCH | TRANSDERMAL | Status: DC
  Administered 2021-03-16: 1.5 mg via TRANSDERMAL
  Filled 2021-03-16: qty 1

## 2021-03-16 MED ORDER — GLYCOPYRROLATE 0.2 MG/ML IJ SOLN: 0.4000 mg | INTRAMUSCULAR | Status: DC | PRN

## 2021-03-16 MED ORDER — MORPHINE SULFATE (PF) 2 MG/ML IV SOLN
1.0000 mg | INTRAVENOUS | Status: DC | PRN
  Administered 2021-03-16: 2 mg via INTRAVENOUS
  Filled 2021-03-16: qty 1

## 2021-03-30 NOTE — Progress Notes (Signed)
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
## 2021-03-30 NOTE — Progress Notes (Signed)
Patient vomited coffee ground emesis oral suctioning done and MD made aware. Nasal tracheal suctioning ordered for RT.
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
## 2021-03-30 NOTE — Progress Notes (Signed)
Upon assessment, Pt has no breathing/pulse/HR. Vitals signs checked. Verified by Laruth Bouchard. With order to pronounce death. Pt expired at 2100H. Jeannette Corpus NP made aware. Larry Mcfarland, Son at bedside. Post Mortem care/Flowsheet completed and done. Pt body transported to the [facility]

## 2021-03-30 DEATH — deceased
